# Patient Record
Sex: Male | Born: 1975 | Race: White | Hispanic: No | Smoking: Current every day smoker
Health system: Southern US, Community
[De-identification: ages and names within clinical notes are randomized; demographics above are authoritative.]

## PROBLEM LIST (undated history)

## (undated) DIAGNOSIS — Z9289 Personal history of other medical treatment: Secondary | ICD-10-CM

## (undated) DIAGNOSIS — Z8719 Personal history of other diseases of the digestive system: Secondary | ICD-10-CM

## (undated) DIAGNOSIS — F1910 Other psychoactive substance abuse, uncomplicated: Secondary | ICD-10-CM

## (undated) DIAGNOSIS — Z8711 Personal history of peptic ulcer disease: Secondary | ICD-10-CM

## (undated) DIAGNOSIS — M545 Low back pain, unspecified: Secondary | ICD-10-CM

## (undated) DIAGNOSIS — G8929 Other chronic pain: Secondary | ICD-10-CM

## (undated) DIAGNOSIS — B1920 Unspecified viral hepatitis C without hepatic coma: Secondary | ICD-10-CM

## (undated) DIAGNOSIS — F329 Major depressive disorder, single episode, unspecified: Secondary | ICD-10-CM

## (undated) DIAGNOSIS — R112 Nausea with vomiting, unspecified: Secondary | ICD-10-CM

## (undated) DIAGNOSIS — M25519 Pain in unspecified shoulder: Secondary | ICD-10-CM

## (undated) DIAGNOSIS — J42 Unspecified chronic bronchitis: Secondary | ICD-10-CM

## (undated) DIAGNOSIS — IMO0002 Reserved for concepts with insufficient information to code with codable children: Secondary | ICD-10-CM

## (undated) DIAGNOSIS — G629 Polyneuropathy, unspecified: Secondary | ICD-10-CM

## (undated) DIAGNOSIS — F419 Anxiety disorder, unspecified: Secondary | ICD-10-CM

## (undated) DIAGNOSIS — F32A Depression, unspecified: Secondary | ICD-10-CM

## (undated) DIAGNOSIS — F17200 Nicotine dependence, unspecified, uncomplicated: Secondary | ICD-10-CM

## (undated) DIAGNOSIS — R413 Other amnesia: Secondary | ICD-10-CM

## (undated) DIAGNOSIS — K219 Gastro-esophageal reflux disease without esophagitis: Secondary | ICD-10-CM

## (undated) DIAGNOSIS — F1990 Other psychoactive substance use, unspecified, uncomplicated: Secondary | ICD-10-CM

## (undated) DIAGNOSIS — K7290 Hepatic failure, unspecified without coma: Secondary | ICD-10-CM

## (undated) DIAGNOSIS — Z9889 Other specified postprocedural states: Secondary | ICD-10-CM

## (undated) HISTORY — PX: APPENDECTOMY: SHX54

## (undated) HISTORY — DX: Other chronic pain: G89.29

## (undated) HISTORY — PX: LUMBAR DISC SURGERY: SHX700

## (undated) HISTORY — PX: BACK SURGERY: SHX140

---
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
## 2013-02-03 DIAGNOSIS — Z9289 Personal history of other medical treatment: Secondary | ICD-10-CM

## 2013-02-03 HISTORY — DX: Personal history of other medical treatment: Z92.89

## 2013-02-26 ENCOUNTER — Emergency Department (HOSPITAL_COMMUNITY): Payer: Medicare Other | Admitting: Anesthesiology

## 2013-02-26 ENCOUNTER — Encounter (HOSPITAL_COMMUNITY): Payer: Self-pay | Admitting: Anesthesiology

## 2013-02-26 ENCOUNTER — Emergency Department (HOSPITAL_COMMUNITY): Payer: Medicare Other

## 2013-02-26 ENCOUNTER — Inpatient Hospital Stay (HOSPITAL_COMMUNITY)
Admission: EM | Admit: 2013-02-26 | Discharge: 2013-03-11 | DRG: 463 | Disposition: A | Discharge status: Skilled Nursing Facility | Payer: Medicare Other | Attending: Orthopedic Surgery | Admitting: Orthopedic Surgery

## 2013-02-26 ENCOUNTER — Encounter (HOSPITAL_COMMUNITY): Admission: EM | Disposition: A | Discharge status: Skilled Nursing Facility | Payer: Self-pay | Source: Home / Self Care

## 2013-02-26 DIAGNOSIS — S85509A Unspecified injury of popliteal vein, unspecified leg, initial encounter: Secondary | ICD-10-CM | POA: Diagnosis present

## 2013-02-26 DIAGNOSIS — D62 Acute posthemorrhagic anemia: Secondary | ICD-10-CM | POA: Diagnosis present

## 2013-02-26 DIAGNOSIS — S82209B Unspecified fracture of shaft of unspecified tibia, initial encounter for open fracture type I or II: Secondary | ICD-10-CM

## 2013-02-26 DIAGNOSIS — S82409B Unspecified fracture of shaft of unspecified fibula, initial encounter for open fracture type I or II: Principal | ICD-10-CM | POA: Diagnosis present

## 2013-02-26 DIAGNOSIS — M79609 Pain in unspecified limb: Secondary | ICD-10-CM | POA: Diagnosis present

## 2013-02-26 DIAGNOSIS — S85502A Unspecified injury of popliteal vein, left leg, initial encounter: Secondary | ICD-10-CM

## 2013-02-26 DIAGNOSIS — T794XXA Traumatic shock, initial encounter: Secondary | ICD-10-CM | POA: Diagnosis present

## 2013-02-26 DIAGNOSIS — R000 Tachycardia, unspecified: Secondary | ICD-10-CM | POA: Diagnosis present

## 2013-02-26 DIAGNOSIS — S8990XA Unspecified injury of unspecified lower leg, initial encounter: Secondary | ICD-10-CM | POA: Diagnosis present

## 2013-02-26 DIAGNOSIS — R578 Other shock: Secondary | ICD-10-CM

## 2013-02-26 DIAGNOSIS — S85009A Unspecified injury of popliteal artery, unspecified leg, initial encounter: Secondary | ICD-10-CM | POA: Diagnosis present

## 2013-02-26 DIAGNOSIS — M549 Dorsalgia, unspecified: Secondary | ICD-10-CM | POA: Diagnosis present

## 2013-02-26 DIAGNOSIS — I959 Hypotension, unspecified: Secondary | ICD-10-CM | POA: Diagnosis present

## 2013-02-26 DIAGNOSIS — S82201B Unspecified fracture of shaft of right tibia, initial encounter for open fracture type I or II: Secondary | ICD-10-CM

## 2013-02-26 DIAGNOSIS — M216X9 Other acquired deformities of unspecified foot: Secondary | ICD-10-CM | POA: Diagnosis present

## 2013-02-26 DIAGNOSIS — G5792 Unspecified mononeuropathy of left lower limb: Secondary | ICD-10-CM | POA: Diagnosis present

## 2013-02-26 DIAGNOSIS — K219 Gastro-esophageal reflux disease without esophagitis: Secondary | ICD-10-CM | POA: Diagnosis present

## 2013-02-26 DIAGNOSIS — F17200 Nicotine dependence, unspecified, uncomplicated: Secondary | ICD-10-CM | POA: Diagnosis present

## 2013-02-26 DIAGNOSIS — G8929 Other chronic pain: Secondary | ICD-10-CM | POA: Diagnosis present

## 2013-02-26 DIAGNOSIS — S85002A Unspecified injury of popliteal artery, left leg, initial encounter: Secondary | ICD-10-CM

## 2013-02-26 DIAGNOSIS — S82202C Unspecified fracture of shaft of left tibia, initial encounter for open fracture type IIIA, IIIB, or IIIC: Secondary | ICD-10-CM

## 2013-02-26 DIAGNOSIS — E876 Hypokalemia: Secondary | ICD-10-CM | POA: Diagnosis present

## 2013-02-26 DIAGNOSIS — G5790 Unspecified mononeuropathy of unspecified lower limb: Secondary | ICD-10-CM | POA: Diagnosis present

## 2013-02-26 HISTORY — PX: BYPASS GRAFT POPLITEAL TO TIBIAL: SHX5764

## 2013-02-26 HISTORY — DX: Pain in unspecified shoulder: M25.519

## 2013-02-26 HISTORY — PX: EXTERNAL FIXATION LEG: SHX1549

## 2013-02-26 HISTORY — DX: Other psychoactive substance use, unspecified, uncomplicated: F19.90

## 2013-02-26 HISTORY — PX: APPLICATION OF WOUND VAC: SHX5189

## 2013-02-26 HISTORY — DX: Gastro-esophageal reflux disease without esophagitis: K21.9

## 2013-02-26 HISTORY — DX: Other chronic pain: G89.29

## 2013-02-26 HISTORY — DX: Nicotine dependence, unspecified, uncomplicated: F17.200

## 2013-02-26 LAB — POCT I-STAT, CHEM 8
BUN: 16 mg/dL (ref 6–23)
Chloride: 108 mEq/L (ref 96–112)
Creatinine, Ser: 1.4 mg/dL — ABNORMAL HIGH (ref 0.50–1.35)
Hemoglobin: 16.7 g/dL (ref 13.0–17.0)
Potassium: 4.3 mEq/L (ref 3.5–5.1)
Sodium: 140 mEq/L (ref 135–145)

## 2013-02-26 LAB — SAMPLE TO BLOOD BANK

## 2013-02-26 LAB — COMPREHENSIVE METABOLIC PANEL
AST: 23 U/L (ref 0–37)
Albumin: 4 g/dL (ref 3.5–5.2)
BUN: 16 mg/dL (ref 6–23)
Chloride: 102 mEq/L (ref 96–112)
Creatinine, Ser: 1.42 mg/dL — ABNORMAL HIGH (ref 0.50–1.35)
Potassium: 4.2 mEq/L (ref 3.5–5.1)
Total Bilirubin: 0.4 mg/dL (ref 0.3–1.2)
Total Protein: 7.3 g/dL (ref 6.0–8.3)

## 2013-02-26 LAB — CBC
HCT: 46.5 % (ref 39.0–52.0)
MCH: 33.3 pg (ref 26.0–34.0)
MCV: 91.5 fL (ref 78.0–100.0)
Platelets: 297 10*3/uL (ref 150–400)
RBC: 5.08 MIL/uL (ref 4.22–5.81)
WBC: 22.2 10*3/uL — ABNORMAL HIGH (ref 4.0–10.5)

## 2013-02-26 SURGERY — EXTERNAL FIXATION, LOWER EXTREMITY
Anesthesia: General | Site: Leg Lower | Laterality: Right

## 2013-02-26 MED ORDER — ETOMIDATE 2 MG/ML IV SOLN
INTRAVENOUS | Status: DC | PRN
  Administered 2013-02-26: 20 mg via INTRAVENOUS

## 2013-02-26 MED ORDER — ONDANSETRON HCL 4 MG/2ML IJ SOLN
INTRAMUSCULAR | Status: DC | PRN
  Administered 2013-02-26: 4 mg via INTRAVENOUS

## 2013-02-26 MED ORDER — HALOPERIDOL LACTATE 5 MG/ML IJ SOLN
INTRAMUSCULAR | Status: AC
  Administered 2013-02-26: 2 mg
  Filled 2013-02-26: qty 1

## 2013-02-26 MED ORDER — FENTANYL CITRATE 0.05 MG/ML IJ SOLN
25.0000 ug | Freq: Once | INTRAMUSCULAR | Status: AC
  Administered 2013-02-26: 25 ug via INTRAVENOUS

## 2013-02-26 MED ORDER — SODIUM CHLORIDE 0.9 % IV SOLN
INTRAVENOUS | Status: DC | PRN
  Administered 2013-02-26: 21:00:00 via INTRAVENOUS

## 2013-02-26 MED ORDER — SODIUM BICARBONATE 8.4 % IV SOLN
INTRAVENOUS | Status: DC | PRN
  Administered 2013-02-26: 50 meq via INTRAVENOUS

## 2013-02-26 MED ORDER — FENTANYL CITRATE 0.05 MG/ML IJ SOLN
INTRAMUSCULAR | Status: AC
  Filled 2013-02-26: qty 2

## 2013-02-26 MED ORDER — CEFAZOLIN SODIUM-DEXTROSE 2-3 GM-% IV SOLR
INTRAVENOUS | Status: AC
  Administered 2013-02-26: 2000 mg
  Filled 2013-02-26: qty 50

## 2013-02-26 MED ORDER — 0.9 % SODIUM CHLORIDE (POUR BTL) OPTIME
TOPICAL | Status: DC | PRN
  Administered 2013-02-26: 1000 mL

## 2013-02-26 MED ORDER — LACTATED RINGERS IV SOLN
INTRAVENOUS | Status: DC | PRN
  Administered 2013-02-26: 21:00:00 via INTRAVENOUS

## 2013-02-26 MED ORDER — SODIUM CHLORIDE 0.9 % IV SOLN: 1000.0000 mL | Freq: Once | INTRAVENOUS | Status: DC

## 2013-02-26 MED ORDER — CEFAZOLIN SODIUM-DEXTROSE 2-3 GM-% IV SOLR
2.0000 g | Freq: Three times a day (TID) | INTRAVENOUS | Status: DC
  Filled 2013-02-26 (×2): qty 50

## 2013-02-26 MED ORDER — LIDOCAINE HCL (CARDIAC) 20 MG/ML IV SOLN
INTRAVENOUS | Status: DC | PRN
  Administered 2013-02-26: 100 mg via INTRAVENOUS

## 2013-02-26 MED ORDER — PROPOFOL 10 MG/ML IV BOLUS: INTRAVENOUS | Status: DC | PRN

## 2013-02-26 MED ORDER — SODIUM CHLORIDE 0.9 % IV SOLN
10.0000 mg | INTRAVENOUS | Status: DC | PRN
  Administered 2013-02-26: 25 ug/min via INTRAVENOUS

## 2013-02-26 MED ORDER — ARTIFICIAL TEARS OP OINT
TOPICAL_OINTMENT | OPHTHALMIC | Status: DC | PRN
  Administered 2013-02-26: 1 via OPHTHALMIC

## 2013-02-26 MED ORDER — ALBUMIN HUMAN 5 % IV SOLN
INTRAVENOUS | Status: DC | PRN
  Administered 2013-02-26 (×2): via INTRAVENOUS

## 2013-02-26 MED ORDER — TETANUS-DIPHTH-ACELL PERTUSSIS 5-2.5-18.5 LF-MCG/0.5 IM SUSP
INTRAMUSCULAR | Status: AC
  Filled 2013-02-26: qty 0.5

## 2013-02-26 MED ORDER — TETANUS-DIPHTH-ACELL PERTUSSIS 5-2.5-18.5 LF-MCG/0.5 IM SUSP
0.5000 mL | Freq: Once | INTRAMUSCULAR | Status: AC
  Administered 2013-02-26: 0.5 mL via INTRAMUSCULAR

## 2013-02-26 MED ORDER — VECURONIUM BROMIDE 10 MG IV SOLR
INTRAVENOUS | Status: DC | PRN
  Administered 2013-02-26: 10 mg via INTRAVENOUS
  Administered 2013-02-26: 5 mg via INTRAVENOUS
  Administered 2013-02-26: 10 mg via INTRAVENOUS
  Administered 2013-02-26: 5 mg via INTRAVENOUS

## 2013-02-26 MED ORDER — FENTANYL CITRATE 0.05 MG/ML IJ SOLN
INTRAMUSCULAR | Status: DC | PRN
  Administered 2013-02-26: 100 ug via INTRAVENOUS
  Administered 2013-02-26: 50 ug via INTRAVENOUS
  Administered 2013-02-26: 100 ug via INTRAVENOUS
  Administered 2013-02-26: 150 ug via INTRAVENOUS
  Administered 2013-02-26: 100 ug via INTRAVENOUS
  Administered 2013-02-27 (×2): 50 ug via INTRAVENOUS

## 2013-02-26 MED ORDER — SODIUM CHLORIDE 0.9 % IV SOLN
1000.0000 mL | Freq: Once | INTRAVENOUS | Status: AC
  Administered 2013-02-26: 1000 mL via INTRAVENOUS

## 2013-02-26 MED ORDER — DEXAMETHASONE SODIUM PHOSPHATE 4 MG/ML IJ SOLN
INTRAMUSCULAR | Status: DC | PRN
  Administered 2013-02-26: 8 mg via INTRAVENOUS

## 2013-02-26 MED ORDER — SODIUM CHLORIDE 0.9 % IR SOLN
Status: DC | PRN
  Administered 2013-02-26: 22:00:00

## 2013-02-26 MED ORDER — HEPARIN SODIUM (PORCINE) 1000 UNIT/ML IJ SOLN
INTRAMUSCULAR | Status: DC | PRN
  Administered 2013-02-26 – 2013-02-27 (×2): 5000 [IU] via INTRAVENOUS

## 2013-02-26 MED ORDER — SODIUM CHLORIDE 0.9 % IV SOLN: 1000.0000 mL | INTRAVENOUS | Status: DC

## 2013-02-26 MED ORDER — SUCCINYLCHOLINE CHLORIDE 20 MG/ML IJ SOLN
INTRAMUSCULAR | Status: DC | PRN
  Administered 2013-02-26: 120 mg via INTRAVENOUS

## 2013-02-26 SURGICAL SUPPLY — 112 items
BANDAGE CONFORM 3  STR LF (GAUZE/BANDAGES/DRESSINGS) ×8 IMPLANT
BANDAGE ELASTIC 4 VELCRO ST LF (GAUZE/BANDAGES/DRESSINGS) ×4 IMPLANT
BANDAGE ELASTIC 6 VELCRO ST LF (GAUZE/BANDAGES/DRESSINGS) ×4 IMPLANT
BANDAGE ESMARK 6X9 LF (GAUZE/BANDAGES/DRESSINGS) ×3 IMPLANT
BANDAGE GAUZE ELAST BULKY 4 IN (GAUZE/BANDAGES/DRESSINGS) ×8 IMPLANT
BNDG ESMARK 6X9 LF (GAUZE/BANDAGES/DRESSINGS) ×4
CANISTER SUCTION 2500CC (MISCELLANEOUS) ×4 IMPLANT
CANISTER WOUND CARE 500ML ATS (WOUND CARE) ×4 IMPLANT
CATH EMB 3FR 80CM (CATHETERS) ×4 IMPLANT
CLAMP LG COMBINATION (Clamp) ×4 IMPLANT
CLAMP PIN LRG 6 POSITION (Clamp) ×8 IMPLANT
CLAMP ROD ATTACHMENT (Clamp) ×4 IMPLANT
CLEANER TIP ELECTROSURG 2X2 (MISCELLANEOUS) ×4 IMPLANT
CLIP TI MEDIUM 24 (CLIP) ×8 IMPLANT
CLIP TI MEDIUM 6 (CLIP) ×4 IMPLANT
CLIP TI WIDE RED SMALL 24 (CLIP) ×8 IMPLANT
CLIP TI WIDE RED SMALL 6 (CLIP) ×4 IMPLANT
CLOTH BEACON ORANGE TIMEOUT ST (SAFETY) ×4 IMPLANT
COVER SURGICAL LIGHT HANDLE (MISCELLANEOUS) ×4 IMPLANT
COVER TABLE BACK 60X90 (DRAPES) ×4 IMPLANT
CUFF TOURNIQUET SINGLE 34IN LL (TOURNIQUET CUFF) ×8 IMPLANT
DERMABOND ADVANCED (GAUZE/BANDAGES/DRESSINGS) ×1
DERMABOND ADVANCED .7 DNX12 (GAUZE/BANDAGES/DRESSINGS) ×3 IMPLANT
DRAIN CHANNEL 15F RND FF W/TCR (WOUND CARE) IMPLANT
DRAPE C-ARM 42X72 X-RAY (DRAPES) ×4 IMPLANT
DRAPE INCISE IOBAN 66X45 STRL (DRAPES) ×4 IMPLANT
DRAPE OEC MINIVIEW 54X84 (DRAPES) ×4 IMPLANT
DRAPE WARM FLUID 44X44 (DRAPE) ×4 IMPLANT
DRAPE X-RAY CASS 24X20 (DRAPES) IMPLANT
DRSG ADAPTIC 3X8 NADH LF (GAUZE/BANDAGES/DRESSINGS) ×4 IMPLANT
DRSG COVADERM 4X10 (GAUZE/BANDAGES/DRESSINGS) ×4 IMPLANT
DRSG COVADERM 4X14 (GAUZE/BANDAGES/DRESSINGS) ×4 IMPLANT
DRSG COVADERM 4X8 (GAUZE/BANDAGES/DRESSINGS) ×4 IMPLANT
DRSG VAC ATS LRG SENSATRAC (GAUZE/BANDAGES/DRESSINGS) ×4 IMPLANT
ELECT REM PT RETURN 9FT ADLT (ELECTROSURGICAL)
ELECTRODE REM PT RTRN 9FT ADLT (ELECTROSURGICAL) IMPLANT
EVACUATOR SILICONE 100CC (DRAIN) IMPLANT
GAUZE XEROFORM 1X8 LF (GAUZE/BANDAGES/DRESSINGS) ×16 IMPLANT
GLOVE BIO SURGEON STRL SZ 6.5 (GLOVE) ×24 IMPLANT
GLOVE BIO SURGEON STRL SZ8 (GLOVE) ×8 IMPLANT
GLOVE BIOGEL M STRL SZ7.5 (GLOVE) ×4 IMPLANT
GLOVE BIOGEL PI IND STRL 6.5 (GLOVE) ×12 IMPLANT
GLOVE BIOGEL PI IND STRL 7.5 (GLOVE) ×6 IMPLANT
GLOVE BIOGEL PI IND STRL 8.5 (GLOVE) ×3 IMPLANT
GLOVE BIOGEL PI INDICATOR 6.5 (GLOVE) ×4
GLOVE BIOGEL PI INDICATOR 7.5 (GLOVE) ×2
GLOVE BIOGEL PI INDICATOR 8.5 (GLOVE) ×1
GLOVE ECLIPSE 6.0 STRL STRAW (GLOVE) ×16 IMPLANT
GLOVE EUDERMIC 7 POWDERFREE (GLOVE) ×4 IMPLANT
GLOVE SS BIOGEL STRL SZ 7.5 (GLOVE) ×6 IMPLANT
GLOVE SUPERSENSE BIOGEL SZ 7.5 (GLOVE) ×2
GLOVE SURG ORTHO 8.0 STRL STRW (GLOVE) ×4 IMPLANT
GLOVE SURG SS PI 7.5 STRL IVOR (GLOVE) ×16 IMPLANT
GOWN PREVENTION PLUS XXLARGE (GOWN DISPOSABLE) ×4 IMPLANT
GOWN STRL NON-REIN LRG LVL3 (GOWN DISPOSABLE) ×36 IMPLANT
GOWN STRL REIN XL XLG (GOWN DISPOSABLE) ×8 IMPLANT
HANDPIECE INTERPULSE COAX TIP (DISPOSABLE)
HEMOSTAT SNOW SURGICEL 2X4 (HEMOSTASIS) IMPLANT
KIT BASIN OR (CUSTOM PROCEDURE TRAY) IMPLANT
KIT ROOM TURNOVER OR (KITS) ×4 IMPLANT
LOOP VESSEL MAXI BLUE (MISCELLANEOUS) ×4 IMPLANT
LOOP VESSEL MINI RED (MISCELLANEOUS) ×4 IMPLANT
NEEDLE 22X1 1/2 (OR ONLY) (NEEDLE) ×4 IMPLANT
NS IRRIG 1000ML POUR BTL (IV SOLUTION) ×12 IMPLANT
PACK ORTHO EXTREMITY (CUSTOM PROCEDURE TRAY) ×4 IMPLANT
PACK PERIPHERAL VASCULAR (CUSTOM PROCEDURE TRAY) ×4 IMPLANT
PAD ARMBOARD 7.5X6 YLW CONV (MISCELLANEOUS) ×8 IMPLANT
PADDING CAST COTTON 6X4 STRL (CAST SUPPLIES) ×12 IMPLANT
ROD CARBON FIBER 450MM (Rod) ×8 IMPLANT
SCREW SCHNZ SD 5.0 60 THRD/150 (Screw) ×12 IMPLANT
SCRW SCHANZ SD 5.0 60 THRD/150 (Screw) ×16 IMPLANT
SET COLLECT BLD 21X3/4 12 (NEEDLE) ×4 IMPLANT
SET COLLECT BLD 21X3/4 12 PB (MISCELLANEOUS) ×4 IMPLANT
SET HNDPC FAN SPRY TIP SCT (DISPOSABLE) IMPLANT
SET MICROPUNCTURE 5F STIFF (MISCELLANEOUS) ×4 IMPLANT
SHEATH AVANTI 11CM 5FR (MISCELLANEOUS) ×8 IMPLANT
SPONGE GAUZE 4X4 12PLY (GAUZE/BANDAGES/DRESSINGS) ×4 IMPLANT
SPONGE SCRUB IODOPHOR (GAUZE/BANDAGES/DRESSINGS) ×4 IMPLANT
STAPLER VISISTAT (STAPLE) IMPLANT
STAPLER VISISTAT 35W (STAPLE) ×12 IMPLANT
STOCKINETTE IMPERVIOUS LG (DRAPES) ×4 IMPLANT
STOPCOCK 4 WAY LG BORE MALE ST (IV SETS) ×4 IMPLANT
STOPCOCK MORSE 400PSI 3WAY (MISCELLANEOUS) ×4 IMPLANT
STRIP CLOSURE SKIN 1/2X4 (GAUZE/BANDAGES/DRESSINGS) IMPLANT
SUCTION FRAZIER TIP 10 FR DISP (SUCTIONS) IMPLANT
SUT ETHILON 3 0 PS 1 (SUTURE) IMPLANT
SUT PROLENE 5 0 C 1 24 (SUTURE) ×8 IMPLANT
SUT PROLENE 6 0 BV (SUTURE) ×12 IMPLANT
SUT SILK 0 FSL (SUTURE) ×4 IMPLANT
SUT SILK 0 TIES 10X30 (SUTURE) ×8 IMPLANT
SUT SILK 2 0 FS (SUTURE) ×4 IMPLANT
SUT SILK 3 0 (SUTURE) ×3
SUT SILK 3-0 18XBRD TIE 12 (SUTURE) ×9 IMPLANT
SUT VIC AB 0 CT1 27 (SUTURE) ×2
SUT VIC AB 0 CT1 27XBRD ANBCTR (SUTURE) ×6 IMPLANT
SUT VIC AB 2-0 CT1 27 (SUTURE) ×3
SUT VIC AB 2-0 CT1 TAPERPNT 27 (SUTURE) ×9 IMPLANT
SUT VIC AB 2-0 CT3 27 (SUTURE) IMPLANT
SUT VIC AB 3-0 SH 27 (SUTURE) ×6
SUT VIC AB 3-0 SH 27X BRD (SUTURE) ×18 IMPLANT
SUT VICRYL 4-0 PS2 18IN ABS (SUTURE) IMPLANT
SYR 3ML LL SCALE MARK (SYRINGE) ×4 IMPLANT
SYR CONTROL 10ML LL (SYRINGE) ×4 IMPLANT
TOWEL OR 17X24 6PK STRL BLUE (TOWEL DISPOSABLE) ×12 IMPLANT
TOWEL OR 17X26 10 PK STRL BLUE (TOWEL DISPOSABLE) ×24 IMPLANT
TRAY FOLEY CATH 14FRSI W/METER (CATHETERS) IMPLANT
TUBE CONNECTING 12X1/4 (SUCTIONS) ×8 IMPLANT
TUBING EXTENTION W/L.L. (IV SETS) ×4 IMPLANT
UNDERPAD 30X30 INCONTINENT (UNDERPADS AND DIAPERS) ×20 IMPLANT
WATER STERILE IRR 1000ML POUR (IV SOLUTION) ×8 IMPLANT
WIRE BENTSON .035X145CM (WIRE) ×4 IMPLANT
YANKAUER SUCT BULB TIP NO VENT (SUCTIONS) ×4 IMPLANT
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
## 2013-02-26 NOTE — ED Notes (Signed)
Remaining units of O- blood and FFP transported with patient to OR.

## 2013-02-26 NOTE — OR Nursing (Signed)
Dr. Rennis Chris present at start of case. Intraop placement of part of Ex. Fix. at around 2230 per Dr. Rennis Chris with Dr. Myra Gianotti continuing his part and Dr. Rennis Chris to finish later.

## 2013-02-26 NOTE — ED Notes (Signed)
Blood bank notified for more O- blood and FFP.

## 2013-02-26 NOTE — Anesthesia Preprocedure Evaluation (Signed)
Anesthesia Evaluation  Patient identified by MRN, date of birth, ID band Patient awake    Reviewed: Unable to perform ROS - Chart review only  Airway Mallampati: II  Neck ROM: Limited  Mouth opening: Limited Mouth Opening Comment: Pt arrives c cervical collar. He is awake. Limited time for ROS Dental   Pulmonary  breath sounds clear to auscultation        Cardiovascular Rhythm:Regular Rate:Tachycardia     Neuro/Psych    GI/Hepatic   Endo/Other    Renal/GU      Musculoskeletal   Abdominal (+) + obese,   Peds  Hematology   Anesthesia Other Findings   Reproductive/Obstetrics                           Anesthesia Physical Anesthesia Plan  ASA: II and emergent  Anesthesia Plan: General   Post-op Pain Management:    Induction: Intravenous  Airway Management Planned: Oral ETT  Additional Equipment:   Intra-op Plan:   Post-operative Plan: Extubation in OR  Informed Consent: I have reviewed the patients History and Physical, chart, labs and discussed the procedure including the risks, benefits and alternatives for the proposed anesthesia with the patient or authorized representative who has indicated his/her understanding and acceptance.   Dental advisory given  Plan Discussed with:   Anesthesia Plan Comments:         Anesthesia Quick Evaluation

## 2013-02-26 NOTE — ED Notes (Signed)
Pt remains alert and conscious at this time.

## 2013-02-26 NOTE — ED Notes (Signed)
1st unit of blood complete via rapid infuser.

## 2013-02-26 NOTE — ED Notes (Signed)
2 Liters Normal Saline initiated.
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
## 2013-02-26 NOTE — ED Notes (Signed)
Second unit of O- Blood started via rapid infuser.

## 2013-02-26 NOTE — ED Notes (Signed)
Patient being prepared for rapid infusion of O- blood

## 2013-02-26 NOTE — ED Notes (Signed)
Second unit of O- Blood complete.

## 2013-02-26 NOTE — ED Notes (Addendum)
O- Negative blood started. No signs or symptoms of reaction.

## 2013-02-26 NOTE — Anesthesia Procedure Notes (Signed)
Procedure Name: Intubation Date/Time: 02/26/2013 8:43 PM Performed by: Wray Kearns A Pre-anesthesia Checklist: Patient identified, Timeout performed, Emergency Drugs available, Suction available and Patient being monitored Patient Re-evaluated:Patient Re-evaluated prior to inductionOxygen Delivery Method: Circle system utilized Preoxygenation: Pre-oxygenation with 100% oxygen Intubation Type: IV induction, Rapid sequence and Cricoid Pressure applied Laryngoscope Size: Mac and 4 Grade View: Grade I Tube type: Oral Tube size: 8.0 mm Number of attempts: 1 Airway Equipment and Method: Stylet Placement Confirmation: ETT inserted through vocal cords under direct vision,  breath sounds checked- equal and bilateral and positive ETCO2 Secured at: 23 cm Tube secured with: Tape Dental Injury: Teeth and Oropharynx as per pre-operative assessment  Comments: Cervical collar disconnected during induction , re-applied after Intubation per Dr Jacklynn Bue .VSS. BBSE checked .

## 2013-02-26 NOTE — ED Notes (Signed)
Pt currently being transported to OR
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
## 2013-02-26 NOTE — ED Notes (Signed)
Generalized abrasion throughout body. Pt c/o of significant pain to R shoulder and R Tibia.

## 2013-02-27 ENCOUNTER — Inpatient Hospital Stay (HOSPITAL_COMMUNITY): Payer: Medicare Other

## 2013-02-27 ENCOUNTER — Emergency Department (HOSPITAL_COMMUNITY): Payer: Medicare Other

## 2013-02-27 ENCOUNTER — Encounter (HOSPITAL_COMMUNITY): Payer: Self-pay | Admitting: *Deleted

## 2013-02-27 LAB — PREPARE FRESH FROZEN PLASMA
Unit division: 0
Unit division: 0

## 2013-02-27 LAB — MRSA PCR SCREENING: MRSA by PCR: NEGATIVE

## 2013-02-27 LAB — BASIC METABOLIC PANEL
BUN: 9 mg/dL (ref 6–23)
Chloride: 110 mEq/L (ref 96–112)
GFR calc Af Amer: >90 mL/min (ref >90)
Potassium: 3.9 mEq/L (ref 3.5–5.1)
Sodium: 142 mEq/L (ref 135–145)

## 2013-02-27 LAB — CBC
HCT: 31.4 % — ABNORMAL LOW (ref 39.0–52.0)
RDW: 16.6 % — ABNORMAL HIGH (ref 11.5–15.5)
WBC: 13.8 10*3/uL — ABNORMAL HIGH (ref 4.0–10.5)

## 2013-02-27 MED ORDER — HYDROMORPHONE HCL PF 1 MG/ML IJ SOLN
0.2500 mg | INTRAMUSCULAR | Status: DC | PRN
  Administered 2013-02-27 (×2): 0.5 mg via INTRAVENOUS

## 2013-02-27 MED ORDER — IOHEXOL 300 MG/ML  SOLN: INTRAMUSCULAR | Status: DC | PRN

## 2013-02-27 MED ORDER — CALCIUM CHLORIDE 10 % IV SOLN: INTRAVENOUS | Status: DC | PRN

## 2013-02-27 MED ORDER — VANCOMYCIN HCL IN DEXTROSE 1-5 GM/200ML-% IV SOLN
1000.0000 mg | Freq: Three times a day (TID) | INTRAVENOUS | Status: DC
  Administered 2013-02-27 – 2013-02-28 (×3): 1000 mg via INTRAVENOUS
  Filled 2013-02-27 (×5): qty 200

## 2013-02-27 MED ORDER — DEXTROSE 5 % IV SOLN
INTRAVENOUS | Status: DC | PRN
  Administered 2013-02-27: 01:00:00 via INTRAVENOUS

## 2013-02-27 MED ORDER — HYDROMORPHONE HCL PF 1 MG/ML IJ SOLN
INTRAMUSCULAR | Status: AC
  Filled 2013-02-27: qty 1

## 2013-02-27 MED ORDER — MEPERIDINE HCL 25 MG/ML IJ SOLN: 6.2500 mg | INTRAMUSCULAR | Status: DC | PRN

## 2013-02-27 MED ORDER — DIPHENHYDRAMINE HCL 12.5 MG/5ML PO ELIX
12.5000 mg | ORAL_SOLUTION | Freq: Four times a day (QID) | ORAL | Status: DC | PRN
  Administered 2013-02-27: 12.5 mg via ORAL
  Filled 2013-02-27 (×2): qty 5

## 2013-02-27 MED ORDER — DIPHENHYDRAMINE HCL 50 MG/ML IJ SOLN
12.5000 mg | Freq: Four times a day (QID) | INTRAMUSCULAR | Status: DC | PRN
  Administered 2013-02-27 – 2013-03-01 (×2): 12.5 mg via INTRAVENOUS
  Filled 2013-02-27 (×2): qty 1

## 2013-02-27 MED ORDER — MIDAZOLAM HCL 5 MG/5ML IJ SOLN
INTRAMUSCULAR | Status: DC | PRN
  Administered 2013-02-26: 2 mg via INTRAVENOUS

## 2013-02-27 MED ORDER — NALOXONE HCL 0.4 MG/ML IJ SOLN: 0.4000 mg | INTRAMUSCULAR | Status: DC | PRN

## 2013-02-27 MED ORDER — LEVOFLOXACIN IN D5W 500 MG/100ML IV SOLN
500.0000 mg | Freq: Once | INTRAVENOUS | Status: AC
  Administered 2013-02-27: 500 mg via INTRAVENOUS
  Filled 2013-02-27: qty 100

## 2013-02-27 MED ORDER — SODIUM CHLORIDE 0.9 % IV SOLN
INTRAVENOUS | Status: DC
  Administered 2013-02-27: 100 mL via INTRAVENOUS

## 2013-02-27 MED ORDER — NEOSTIGMINE METHYLSULFATE 1 MG/ML IJ SOLN
INTRAMUSCULAR | Status: DC | PRN
  Administered 2013-02-27: 5 mg via INTRAVENOUS

## 2013-02-27 MED ORDER — PANTOPRAZOLE SODIUM 40 MG IV SOLR
40.0000 mg | Freq: Every day | INTRAVENOUS | Status: DC
  Administered 2013-02-27 – 2013-03-01 (×3): 40 mg via INTRAVENOUS
  Filled 2013-02-27 (×3): qty 40

## 2013-02-27 MED ORDER — CEFAZOLIN SODIUM-DEXTROSE 2-3 GM-% IV SOLR
INTRAVENOUS | Status: DC | PRN
  Administered 2013-02-27: 2 g via INTRAVENOUS

## 2013-02-27 MED ORDER — OXYCODONE HCL 5 MG PO TABS: 5.0000 mg | ORAL_TABLET | Freq: Once | ORAL | Status: DC | PRN

## 2013-02-27 MED ORDER — ONDANSETRON HCL 4 MG/2ML IJ SOLN: 4.0000 mg | Freq: Four times a day (QID) | INTRAMUSCULAR | Status: DC | PRN

## 2013-02-27 MED ORDER — ONDANSETRON HCL 4 MG PO TABS: 4.0000 mg | ORAL_TABLET | Freq: Four times a day (QID) | ORAL | Status: DC | PRN

## 2013-02-27 MED ORDER — HYDROMORPHONE 0.3 MG/ML IV SOLN
INTRAVENOUS | Status: AC
  Filled 2013-02-27: qty 25

## 2013-02-27 MED ORDER — IOHEXOL 300 MG/ML  SOLN
80.0000 mL | Freq: Once | INTRAMUSCULAR | Status: AC | PRN
  Administered 2013-02-27: 80 mL via INTRAVENOUS

## 2013-02-27 MED ORDER — PROMETHAZINE HCL 25 MG/ML IJ SOLN: 6.2500 mg | INTRAMUSCULAR | Status: DC | PRN

## 2013-02-27 MED ORDER — HYDROMORPHONE 0.3 MG/ML IV SOLN
INTRAVENOUS | Status: DC
  Administered 2013-02-27: 4.08 mg via INTRAVENOUS
  Administered 2013-02-27: 2.5 mg via INTRAVENOUS
  Administered 2013-02-27: 1.8 mg via INTRAVENOUS
  Administered 2013-02-27 (×2): via INTRAVENOUS
  Administered 2013-02-27: 2.4 mg via INTRAVENOUS
  Administered 2013-02-28: 4.2 mg via INTRAVENOUS
  Administered 2013-02-28: 1.8 mg via INTRAVENOUS
  Administered 2013-02-28: 0.9 mg via INTRAVENOUS
  Administered 2013-02-28: 1.5 mg via INTRAVENOUS
  Administered 2013-02-28: 2.7 mg via INTRAVENOUS
  Administered 2013-02-28: 12:00:00 via INTRAVENOUS
  Administered 2013-03-01: 0.3 mg via INTRAVENOUS
  Administered 2013-03-01: 1.5 mg via INTRAVENOUS
  Administered 2013-03-01: 11:00:00 via INTRAVENOUS
  Administered 2013-03-01: 1.35 mg via INTRAVENOUS
  Administered 2013-03-01: 0.3 mg via INTRAVENOUS
  Filled 2013-02-27 (×6): qty 25

## 2013-02-27 MED ORDER — PROTAMINE SULFATE 10 MG/ML IV SOLN
INTRAVENOUS | Status: DC | PRN
  Administered 2013-02-27: 50 mg via INTRAVENOUS

## 2013-02-27 MED ORDER — OXYCODONE HCL 5 MG/5ML PO SOLN: 5.0000 mg | Freq: Once | ORAL | Status: DC | PRN

## 2013-02-27 MED ORDER — GLYCOPYRROLATE 0.2 MG/ML IJ SOLN
INTRAMUSCULAR | Status: DC | PRN
  Administered 2013-02-27: 0.6 mg via INTRAVENOUS

## 2013-02-27 MED ORDER — SODIUM CHLORIDE 0.9 % IJ SOLN: 9.0000 mL | INTRAMUSCULAR | Status: DC | PRN

## 2013-02-27 MED ORDER — CEFAZOLIN SODIUM 1-5 GM-% IV SOLN
INTRAVENOUS | Status: AC
  Filled 2013-02-27: qty 100

## 2013-02-27 MED ORDER — PANTOPRAZOLE SODIUM 40 MG PO TBEC: 40.0000 mg | DELAYED_RELEASE_TABLET | Freq: Every day | ORAL | Status: DC

## 2013-02-27 NOTE — Progress Notes (Signed)
Spoke with radiology about patient's CT scans. Was told that there are several "STAT" orders ahead of him, and that they will call me when they can get him in; and that it will be closer to 7am.  A. Josiyah Tozzi RN
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
## 2013-02-27 NOTE — Progress Notes (Signed)
I agree with the above The patient is doing remarkably well this morning. He does not complain of significant pain in his foot. He has no motor or sensory function in the left leg which is not 2 different from his baseline. He has a palpable posterior tibial pulse.  I will defer to orthopedics for the appropriate antibiotic coverage for the open tibia fibula fracture.  Wound VAC remains in place with expected drainage. We will monitor his hemoglobin. He may require transfusion. He will need to return trip to the operating room within the next one to 2 days.  Durene Cal

## 2013-02-27 NOTE — Progress Notes (Signed)
D; Sent CBC & BMP to lab
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
## 2013-02-27 NOTE — Anesthesia Postprocedure Evaluation (Signed)
  Anesthesia Post-op Note  Patient: [name]  Procedure(s) Performed: Procedure(s) with comments: EXTERNAL FIXATION LEG (Left) BYPASS GRAFT POPLITEAL TO TIBIAL (Left) - using Right Reversed Greater Saphenous Vein VEIN HARVEST (Right) - Harvest of Right Greater Saphenous Vein  APPLICATION OF WOUND VAC (Left)  Patient Location: PACU  Anesthesia Type:General  Level of Consciousness: awake and sedated  Airway and Oxygen Therapy: Patient Spontanous Breathing  Post-op Pain: mild  Post-op Assessment: Post-op Vital signs reviewed  Post-op Vital Signs: stable  Complications: No apparent anesthesia complications
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
## 2013-02-27 NOTE — Op Note (Signed)
02/26/2013 - 02/27/2013  6:54 AM  PATIENT:   [name]  37 y.o. male  PRE-OPERATIVE DIAGNOSIS:  Tibia/fibula fracture Left III-C    POST-OPERATIVE DIAGNOSIS:  same  PROCEDURE: I and D left III-C tib-fib fracture, Spanning external fixation distal femur to distal tibia  SURGEON:  Yaslin Kirtley, Vania Rea M.D.  ASSISTANTS: Dixon, pac  ANESTHESIA:   GET  EBL: Available from anesthesia record  SPECIMEN:  none  Drains: VAC placed by Dr. Myra Gianotti   PATIENT DISPOSITION:  Remained in OR at completion of orthopaedic procedures    PLAN OF CARE: monitor vascular and wound status.  Dictation# [id number]
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
## 2013-02-27 NOTE — Progress Notes (Signed)
Subjective: 1 Day Post-Op Procedure(s) (LRB): EXTERNAL FIXATION LEG (Left) BYPASS GRAFT POPLITEAL TO TIBIAL (Left) VEIN HARVEST (Right) APPLICATION OF WOUND VAC (Left) Patient reports pain as moderate.  Has no feeling in foot. Says he had nerve damage from previous back problem but still had some sensation prior to this injury   Objective: Vital signs in last 24 hours: Temp:  [97.9 F (36.6 C)-99.5 F (37.5 C)] 98.7 F (37.1 C) (05/25 0808) Pulse Rate:  [82-168] 88 (05/25 0800) Resp:  [11-39] 14 (05/25 0811) BP: (60-160)/(33-95) 106/64 mmHg (05/25 0800) SpO2:  [96 %-100 %] 100 % (05/25 0811) Arterial Line BP: (121-172)/(60-73) 125/68 mmHg (05/25 0800) Weight:  [190 lb (86.183 kg)] 190 lb (86.183 kg) (05/24 1921)  Intake/Output from previous day: 05/24 0701 - 05/25 0700 In: 16109 [I.V.:10350; UEAVW:0981; IV Piggyback:750] Out: 9000 [Urine:7250; Drains:350; Blood:1400] Intake/Output this shift: Total I/O In: 100 [IV Piggyback:100] Out: -    Recent Labs  02/26/13 1907 02/26/13 1926 02/27/13 0330  HGB 16.9 16.7 11.0*    Recent Labs  02/26/13 1907 02/26/13 1926 02/27/13 0330  WBC 22.2*  --  13.8*  RBC 5.08  --  3.59*  HCT 46.5 49.0 31.4*  PLT 297  --  122*    Recent Labs  02/26/13 1907 02/26/13 1926 02/27/13 0330  NA 138 140 142  K 4.2 4.3 3.9  CL 102 108 110  CO2 23  --  24  BUN 16 16 9   CREATININE 1.42* 1.40* 0.81  GLUCOSE 163* 158* 184*  CALCIUM 9.4  --  7.3*    Recent Labs  02/26/13 1907  INR 0.93    No cellulitis present Compartment soft Foot warm and pink. No sensation (plantar or dorsal) and no motor left foot  Assessment/Plan: 1 Day Post-Op Procedure(s) (LRB): EXTERNAL FIXATION LEG (Left) BYPASS GRAFT POPLITEAL TO TIBIAL (Left) VEIN HARVEST (Right) APPLICATION OF WOUND VAC (Left)  Continued neurovascular checks LLE. Continue VAC.  Loanne Drilling 02/27/2013, 9:04 AM

## 2013-02-27 NOTE — Transfer of Care (Signed)
Immediate Anesthesia Transfer of Care Note  Patient: [name]  Procedure(s) Performed: Procedure(s) with comments: EXTERNAL FIXATION LEG (Left) BYPASS GRAFT POPLITEAL TO TIBIAL (Left) - using Right Reversed Greater Saphenous Vein VEIN HARVEST (Right) - Harvest of Right Greater Saphenous Vein  APPLICATION OF WOUND VAC (Left)  Patient Location: PACU  Anesthesia Type:General  Level of Consciousness: sedated, patient cooperative and responds to stimulation  Airway & Oxygen Therapy: Patient Spontanous Breathing and Patient connected to nasal cannula oxygen  Post-op Assessment: Report given to PACU RN, Post -op Vital signs reviewed and stable and **Pt at the moment unable to move left foot/ toes*  Post vital signs: Reviewed and stable  Complications: No apparent anesthesia complications
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
## 2013-02-28 DIAGNOSIS — R000 Tachycardia, unspecified: Secondary | ICD-10-CM

## 2013-02-28 DIAGNOSIS — D62 Acute posthemorrhagic anemia: Secondary | ICD-10-CM

## 2013-02-28 LAB — VANCOMYCIN, TROUGH: Vancomycin Tr: 8.7 ug/mL — ABNORMAL LOW (ref 10.0–20.0)

## 2013-02-28 LAB — CDS SEROLOGY

## 2013-02-28 MED ORDER — DILTIAZEM HCL 100 MG IV SOLR: 10.0000 mg/h | INTRAVENOUS | Status: DC

## 2013-02-28 MED ORDER — VANCOMYCIN HCL 10 G IV SOLR
1250.0000 mg | Freq: Three times a day (TID) | INTRAVENOUS | Status: DC
  Administered 2013-02-28 – 2013-03-05 (×13): 1250 mg via INTRAVENOUS
  Filled 2013-02-28 (×18): qty 1250

## 2013-02-28 MED ORDER — ENOXAPARIN SODIUM 30 MG/0.3ML ~~LOC~~ SOLN
30.0000 mg | Freq: Two times a day (BID) | SUBCUTANEOUS | Status: DC
  Administered 2013-02-28 – 2013-03-02 (×5): 30 mg via SUBCUTANEOUS
  Filled 2013-02-28 (×9): qty 0.3

## 2013-02-28 MED ORDER — CARISOPRODOL 350 MG PO TABS
350.0000 mg | ORAL_TABLET | Freq: Three times a day (TID) | ORAL | Status: DC
  Administered 2013-02-28 – 2013-03-08 (×23): 350 mg via ORAL
  Filled 2013-02-28 (×23): qty 1

## 2013-02-28 NOTE — Progress Notes (Signed)
[facility] Progress Note  02/28/2013 7:21 AM 2 Days Post-Op  Subjective:  "my foot's hurting" RN states pt extremely tachycardic with bath and turning last pm  Afebrile HR 80's-120 regular 100-130 systolic 100% 1LO2NC  Filed Vitals:   02/28/13 0700  BP: 112/56  Pulse: 108  Temp:   Resp: 18    Physical Exam: Incisions:  Wound vac in place with good seal; all other incisions with bandages and are c/d/i Extremities:  Left foot is warm with palpable PT  CBC    Component Value Date/Time   WBC 13.8* 02/27/2013 0330   RBC 3.59* 02/27/2013 0330   HGB 11.0* 02/27/2013 0330   HCT 31.4* 02/27/2013 0330   PLT 122* 02/27/2013 0330   MCV 87.5 02/27/2013 0330   MCH 30.6 02/27/2013 0330   MCHC 35.0 02/27/2013 0330   RDW 16.6* 02/27/2013 0330    BMET    Component Value Date/Time   NA 142 02/27/2013 0330   K 3.9 02/27/2013 0330   CL 110 02/27/2013 0330   CO2 24 02/27/2013 0330   GLUCOSE 184* 02/27/2013 0330   BUN 9 02/27/2013 0330   CREATININE 0.81 02/27/2013 0330   CALCIUM 7.3* 02/27/2013 0330   GFRNONAA >90 02/27/2013 0330   GFRAA >90 02/27/2013 0330    INR    Component Value Date/Time   INR 0.93 02/26/2013 1907     Intake/Output Summary (Last 24 hours) at 02/28/13 0721 Last data filed at 02/28/13 0600  Gross per 24 hour  Intake 3941.93 ml  Output   4150 ml  Net -208.07 ml     Assessment/Plan:  37 y.o. male is s/p:  #1: Left distal superficial femoral artery to posterior tibial artery bypass graft with contralateral reversed greater saphenous vein. #2: 4 compartment fasciotomy   2 Days Post-Op  -pt will need return to OR in next day or two for washout and wound vac change. -DVT prophylaxis: ok to start lovenox per Dr. Myra Gianotti -continue to monitor progress-bypass graft is patent -continue ABx per ortho/trauma-on levaquin and vancomycin   Doreatha Massed, PA-C [facility] [phone] 02/28/2013 7:21 AM
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
## 2013-02-28 NOTE — Progress Notes (Signed)
Subjective: 2 Days Post-Op Procedure(s) (LRB): EXTERNAL FIXATION LEG (Left) BYPASS GRAFT POPLITEAL TO TIBIAL (Left) VEIN HARVEST (Right) APPLICATION OF WOUND VAC (Left) Patient reports pain as severe.    Objective: Vital signs in last 24 hours: Temp:  [98 F (36.7 C)-98.5 F (36.9 C)] 98.5 F (36.9 C) (05/26 0756) Pulse Rate:  [93-119] 116 (05/26 0800) Resp:  [11-23] 20 (05/26 0800) BP: (98-129)/(49-87) 112/62 mmHg (05/26 0800) SpO2:  [95 %-100 %] 99 % (05/26 0800) Arterial Line BP: (86-128)/(65-110) 86/65 mmHg (05/26 0800)  Intake/Output from previous day: 05/25 0701 - 05/26 0700 In: 4041.9 [P.O.:1244; I.V.:2097.9; IV Piggyback:700] Out: 4150 [Urine:3650; Drains:500] Intake/Output this shift: Total I/O In: 100 [I.V.:100] Out: 500 [Urine:450; Drains:50]   Recent Labs  02/26/13 1907 02/26/13 1926 02/27/13 0330  HGB 16.9 16.7 11.0*    Recent Labs  02/26/13 1907 02/26/13 1926 02/27/13 0330  WBC 22.2*  --  13.8*  RBC 5.08  --  3.59*  HCT 46.5 49.0 31.4*  PLT 297  --  122*    Recent Labs  02/26/13 1907 02/26/13 1926 02/27/13 0330  NA 138 140 142  K 4.2 4.3 3.9  CL 102 108 110  CO2 23  --  24  BUN 16 16 9   CREATININE 1.42* 1.40* 0.81  GLUCOSE 163* 158* 184*  CALCIUM 9.4  --  7.3*    Recent Labs  02/26/13 1907  INR 0.93    wn wd male in nad.  A and O.  EOMI.  Resp unlabored.  R shoulder with full ROM and full strength thfroughout RC.  Sens to LT intact in radial, median and ulnar n dist.  2+ radial and ulnar pulses.  TTP at scap posteriorly.  R LE without tenderness, swelling or deformity.  L UE without tenderness, swelling or deformity.  L LE with wound vac in place over medial wound.  Ex fix in place spanning the knee.  L foot with 1+ dp and pt pulses.  No sens to LT in deep peroneal n dist.  No sens in lateral plantar n dist.  Diminished sens to LT in medial plantar n dist and sup peronal n dist.  0/5 strength at toe flexors, GS, tib ant, EHL, long  toe extensors.   Ankle is plantar flexed.  Assessment/Plan: 2 Days Post-Op Procedure(s) (LRB): EXTERNAL FIXATION LEG (Left) BYPASS GRAFT POPLITEAL TO TIBIAL (Left) VEIN HARVEST (Right) APPLICATION OF WOUND VAC (Left)  I'll start SOMA to help with muscle cramps.  Pt will need an AFO to help with ankle positioning.  Per vascular, the plan is for repeat I and D and wound vac change in the next few days.  He'll need definitive treatment of the proximal tibia fracture as well.  Timing of this is uncertain.  Continue NWB on the L LE.  [name] 02/28/2013, 8:54 AM

## 2013-02-28 NOTE — Progress Notes (Signed)
I agree with the above. Wound VAC remains in place. Drainage has decreased. He continues to have a palpable posterior tibial pulse. He'll need a wound VAC change. Ultrasound according this with orthopedics. I would try to start Lovenox for DVT prophylaxis today and monitor his wound VAC output  [name]

## 2013-02-28 NOTE — Progress Notes (Signed)
Agree with above 

## 2013-03-01 ENCOUNTER — Inpatient Hospital Stay (HOSPITAL_COMMUNITY): Payer: Medicare Other

## 2013-03-01 ENCOUNTER — Inpatient Hospital Stay (HOSPITAL_COMMUNITY): Payer: Medicare Other | Admitting: Anesthesiology

## 2013-03-01 ENCOUNTER — Encounter (HOSPITAL_COMMUNITY): Admission: EM | Disposition: A | Discharge status: Skilled Nursing Facility | Payer: Self-pay | Source: Home / Self Care

## 2013-03-01 ENCOUNTER — Encounter (HOSPITAL_COMMUNITY): Payer: Self-pay | Admitting: Anesthesiology

## 2013-03-01 DIAGNOSIS — M549 Dorsalgia, unspecified: Secondary | ICD-10-CM | POA: Diagnosis present

## 2013-03-01 DIAGNOSIS — K219 Gastro-esophageal reflux disease without esophagitis: Secondary | ICD-10-CM | POA: Diagnosis present

## 2013-03-01 DIAGNOSIS — E876 Hypokalemia: Secondary | ICD-10-CM

## 2013-03-01 DIAGNOSIS — G5792 Unspecified mononeuropathy of left lower limb: Secondary | ICD-10-CM | POA: Diagnosis present

## 2013-03-01 HISTORY — PX: APPLICATION OF WOUND VAC: SHX5189

## 2013-03-01 HISTORY — PX: I & D EXTREMITY: SHX5045

## 2013-03-01 LAB — POCT I-STAT 7, (LYTES, BLD GAS, ICA,H+H)
Acid-base deficit: 4 mmol/L — ABNORMAL HIGH (ref 0.0–2.0)
Bicarbonate: 20 meq/L (ref 20.0–24.0)
Bicarbonate: 24.2 meq/L — ABNORMAL HIGH (ref 20.0–24.0)
Calcium, Ion: 1.01 mmol/L — ABNORMAL LOW (ref 1.12–1.23)
HCT: 38 % — ABNORMAL LOW (ref 39.0–52.0)
Hemoglobin: 12.9 g/dL — ABNORMAL LOW (ref 13.0–17.0)
Hemoglobin: 9.2 g/dL — ABNORMAL LOW (ref 13.0–17.0)
O2 Saturation: 100 %
O2 Saturation: 100 %
Patient temperature: 36.4
Potassium: 3.9 meq/L (ref 3.5–5.1)
Potassium: 3.7 meq/L (ref 3.5–5.1)
Sodium: 143 meq/L (ref 135–145)
Sodium: 146 meq/L — ABNORMAL HIGH (ref 135–145)
TCO2: 24 mmol/L (ref 0–100)
TCO2: 25 mmol/L (ref 0–100)
pCO2 arterial: 41.2 mmHg (ref 35.0–45.0)
pH, Arterial: 7.288 — ABNORMAL LOW (ref 7.350–7.450)
pH, Arterial: 7.326 — ABNORMAL LOW (ref 7.350–7.450)
pH, Arterial: 7.375 (ref 7.350–7.450)
pO2, Arterial: 439 mmHg — ABNORMAL HIGH (ref 80.0–100.0)

## 2013-03-01 LAB — BASIC METABOLIC PANEL
Chloride: 99 mEq/L (ref 96–112)
Creatinine, Ser: 0.72 mg/dL (ref 0.50–1.35)
GFR calc Af Amer: >90 mL/min (ref >90)
GFR calc non Af Amer: >90 mL/min (ref >90)

## 2013-03-01 LAB — CBC
MCV: 87.4 fL (ref 78.0–100.0)
Platelets: 135 10*3/uL — ABNORMAL LOW (ref 150–400)
RDW: 15.3 % (ref 11.5–15.5)
WBC: 12.4 10*3/uL — ABNORMAL HIGH (ref 4.0–10.5)

## 2013-03-01 SURGERY — APPLICATION, WOUND VAC
Anesthesia: General | Site: Leg Lower | Laterality: Left | Wound class: Contaminated

## 2013-03-01 MED ORDER — PREGABALIN 50 MG PO CAPS
300.0000 mg | ORAL_CAPSULE | Freq: Two times a day (BID) | ORAL | Status: DC
  Administered 2013-03-01 – 2013-03-11 (×18): 300 mg via ORAL
  Filled 2013-03-01 (×18): qty 6

## 2013-03-01 MED ORDER — PANTOPRAZOLE SODIUM 40 MG PO TBEC
40.0000 mg | DELAYED_RELEASE_TABLET | Freq: Two times a day (BID) | ORAL | Status: DC
  Administered 2013-03-01 – 2013-03-11 (×18): 40 mg via ORAL
  Filled 2013-03-01 (×18): qty 1

## 2013-03-01 MED ORDER — ONDANSETRON HCL 4 MG/2ML IJ SOLN
INTRAMUSCULAR | Status: DC | PRN
  Administered 2013-03-01: 4 mg via INTRAVENOUS

## 2013-03-01 MED ORDER — LACTATED RINGERS IV SOLN
INTRAVENOUS | Status: DC | PRN
  Administered 2013-03-01: 11:00:00 via INTRAVENOUS

## 2013-03-01 MED ORDER — FENTANYL CITRATE 0.05 MG/ML IJ SOLN
INTRAMUSCULAR | Status: DC | PRN
  Administered 2013-03-01 (×2): 50 ug via INTRAVENOUS
  Administered 2013-03-01: 100 ug via INTRAVENOUS
  Administered 2013-03-01: 50 ug via INTRAVENOUS

## 2013-03-01 MED ORDER — OXYCODONE HCL 5 MG/5ML PO SOLN: 5.0000 mg | Freq: Once | ORAL | Status: DC | PRN

## 2013-03-01 MED ORDER — MIDAZOLAM HCL 5 MG/5ML IJ SOLN
INTRAMUSCULAR | Status: DC | PRN
  Administered 2013-03-01: 2 mg via INTRAVENOUS

## 2013-03-01 MED ORDER — PREGABALIN 50 MG PO CAPS: 300.0000 mg | ORAL_CAPSULE | Freq: Two times a day (BID) | ORAL | Status: DC

## 2013-03-01 MED ORDER — POTASSIUM CHLORIDE CRYS ER 20 MEQ PO TBCR
40.0000 meq | EXTENDED_RELEASE_TABLET | Freq: Two times a day (BID) | ORAL | Status: DC
  Administered 2013-03-01 – 2013-03-11 (×19): 40 meq via ORAL
  Filled 2013-03-01 (×25): qty 2

## 2013-03-01 MED ORDER — DOXEPIN HCL 75 MG PO CAPS: 150.0000 mg | ORAL_CAPSULE | Freq: Every day | ORAL | Status: DC

## 2013-03-01 MED ORDER — PROPOFOL 10 MG/ML IV BOLUS
INTRAVENOUS | Status: DC | PRN
  Administered 2013-03-01: 200 mg via INTRAVENOUS

## 2013-03-01 MED ORDER — 0.9 % SODIUM CHLORIDE (POUR BTL) OPTIME
TOPICAL | Status: DC | PRN
  Administered 2013-03-01: 1000 mL

## 2013-03-01 MED ORDER — HYDROMORPHONE HCL PF 1 MG/ML IJ SOLN
1.0000 mg | INTRAMUSCULAR | Status: DC | PRN
  Administered 2013-03-01 – 2013-03-08 (×31): 1 mg via INTRAVENOUS
  Filled 2013-03-01 (×31): qty 1

## 2013-03-01 MED ORDER — DOXEPIN HCL 75 MG PO CAPS
150.0000 mg | ORAL_CAPSULE | Freq: Every day | ORAL | Status: DC
  Administered 2013-03-01 – 2013-03-10 (×10): 150 mg via ORAL
  Filled 2013-03-01 (×11): qty 2

## 2013-03-01 MED ORDER — PHENYLEPHRINE HCL 10 MG/ML IJ SOLN
INTRAMUSCULAR | Status: DC | PRN
  Administered 2013-03-01: 80 ug via INTRAVENOUS

## 2013-03-01 MED ORDER — OXYCODONE HCL 5 MG PO TABS: 5.0000 mg | ORAL_TABLET | Freq: Once | ORAL | Status: DC | PRN

## 2013-03-01 MED ORDER — HYDROMORPHONE HCL PF 1 MG/ML IJ SOLN: 0.2500 mg | INTRAMUSCULAR | Status: DC | PRN

## 2013-03-01 MED ORDER — LIDOCAINE HCL (CARDIAC) 20 MG/ML IV SOLN
INTRAVENOUS | Status: DC | PRN
  Administered 2013-03-01: 100 mg via INTRAVENOUS

## 2013-03-01 MED ORDER — OXYCODONE HCL 5 MG PO TABS
10.0000 mg | ORAL_TABLET | ORAL | Status: DC | PRN
  Administered 2013-03-01 (×2): 10 mg via ORAL
  Administered 2013-03-01 – 2013-03-10 (×42): 20 mg via ORAL
  Filled 2013-03-01 (×12): qty 4
  Filled 2013-03-01: qty 2
  Filled 2013-03-01 (×6): qty 4
  Filled 2013-03-01: qty 1
  Filled 2013-03-01 (×9): qty 4
  Filled 2013-03-01: qty 2
  Filled 2013-03-01 (×15): qty 4

## 2013-03-01 SURGICAL SUPPLY — 46 items
BANDAGE CONFORM 3  STR LF (GAUZE/BANDAGES/DRESSINGS) ×4 IMPLANT
BANDAGE ELASTIC 4 VELCRO ST LF (GAUZE/BANDAGES/DRESSINGS) IMPLANT
BANDAGE ELASTIC 6 VELCRO ST LF (GAUZE/BANDAGES/DRESSINGS) IMPLANT
BANDAGE GAUZE ELAST BULKY 4 IN (GAUZE/BANDAGES/DRESSINGS) IMPLANT
CANISTER SUCTION 2500CC (MISCELLANEOUS) ×2 IMPLANT
CANISTER WOUND CARE 500ML ATS (WOUND CARE) ×2 IMPLANT
CLIP TI MEDIUM 6 (CLIP) IMPLANT
CLIP TI WIDE RED SMALL 6 (CLIP) IMPLANT
CLOTH BEACON ORANGE TIMEOUT ST (SAFETY) ×2 IMPLANT
COVER SURGICAL LIGHT HANDLE (MISCELLANEOUS) ×2 IMPLANT
DRAPE EXTREMITY BILATERAL (DRAPE) IMPLANT
DRAPE EXTREMITY T 121X128X90 (DRAPE) IMPLANT
DRAPE INCISE IOBAN 66X45 STRL (DRAPES) ×2 IMPLANT
DRAPE PROXIMA HALF (DRAPES) ×4 IMPLANT
DRAPE U-SHAPE 76X120 STRL (DRAPES) IMPLANT
DRSG COVADERM 4X6 (GAUZE/BANDAGES/DRESSINGS) ×2 IMPLANT
DRSG EMULSION OIL 3X3 NADH (GAUZE/BANDAGES/DRESSINGS) ×4 IMPLANT
DRSG VAC ATS LRG SENSATRAC (GAUZE/BANDAGES/DRESSINGS) ×2 IMPLANT
ELECT REM PT RETURN 9FT ADLT (ELECTROSURGICAL) ×2
ELECTRODE REM PT RTRN 9FT ADLT (ELECTROSURGICAL) ×1 IMPLANT
GAUZE XEROFORM 1X8 LF (GAUZE/BANDAGES/DRESSINGS) ×2 IMPLANT
GAUZE XEROFORM 5X9 LF (GAUZE/BANDAGES/DRESSINGS) IMPLANT
GLOVE BIOGEL PI IND STRL 7.5 (GLOVE) ×1 IMPLANT
GLOVE BIOGEL PI INDICATOR 7.5 (GLOVE) ×1
GLOVE SURG SS PI 7.5 STRL IVOR (GLOVE) ×4 IMPLANT
GOWN PREVENTION PLUS XXLARGE (GOWN DISPOSABLE) ×2 IMPLANT
GOWN STRL NON-REIN LRG LVL3 (GOWN DISPOSABLE) ×2 IMPLANT
KIT BASIN OR (CUSTOM PROCEDURE TRAY) ×2 IMPLANT
KIT ROOM TURNOVER OR (KITS) ×2 IMPLANT
NS IRRIG 1000ML POUR BTL (IV SOLUTION) ×2 IMPLANT
PACK CV ACCESS (CUSTOM PROCEDURE TRAY) IMPLANT
PACK GENERAL/GYN (CUSTOM PROCEDURE TRAY) ×2 IMPLANT
PACK UNIVERSAL I (CUSTOM PROCEDURE TRAY) ×2 IMPLANT
PAD ARMBOARD 7.5X6 YLW CONV (MISCELLANEOUS) ×4 IMPLANT
SPONGE GAUZE 4X4 12PLY (GAUZE/BANDAGES/DRESSINGS) ×2 IMPLANT
STAPLER VISISTAT 35W (STAPLE) IMPLANT
SUT ETHILON 3 0 PS 1 (SUTURE) IMPLANT
SUT SILK 3 0 (SUTURE) ×1
SUT SILK 3-0 18XBRD TIE 12 (SUTURE) ×1 IMPLANT
SUT VIC AB 2-0 CTX 36 (SUTURE) IMPLANT
SUT VIC AB 3-0 SH 27 (SUTURE)
SUT VIC AB 3-0 SH 27X BRD (SUTURE) IMPLANT
SUT VICRYL 4-0 PS2 18IN ABS (SUTURE) IMPLANT
TOWEL OR 17X24 6PK STRL BLUE (TOWEL DISPOSABLE) ×2 IMPLANT
TOWEL OR 17X26 10 PK STRL BLUE (TOWEL DISPOSABLE) ×2 IMPLANT
WATER STERILE IRR 1000ML POUR (IV SOLUTION) ×2 IMPLANT

## 2013-03-01 NOTE — Interval H&P Note (Signed)
History and Physical Interval Note:  03/01/2013 11:20 AM  [name]  has presented today for surgery, with the diagnosis of left leg wound vac  The various methods of treatment have been discussed with the patient and family. After consideration of risks, benefits and other options for treatment, the patient has consented to  Procedure(s):  WOUND VAC CHANGE (Left) IRRIGATION AND DEBRIDEMENT EXTREMITY (Left) as a surgical intervention .  The patient's history has been reviewed, patient examined, no change in status, stable for surgery.  I have reviewed the patient's chart and labs.  Questions were answered to the patient's satisfaction.     Izak Anding IV, V. WELLS

## 2013-03-01 NOTE — Transfer of Care (Signed)
Immediate Anesthesia Transfer of Care Note  Patient: [name]  Procedure(s) Performed: Procedure(s):  WOUND VAC CHANGE (Left) IRRIGATION AND DEBRIDEMENT EXTREMITY (Left)  Patient Location: PACU  Anesthesia Type:General  Level of Consciousness: awake, alert  and oriented  Airway & Oxygen Therapy: Patient Spontanous Breathing  Post-op Assessment: Report given to PACU RN and Post -op Vital signs reviewed and stable  Post vital signs: Reviewed and stable  Complications: No apparent anesthesia complications

## 2013-03-01 NOTE — Anesthesia Preprocedure Evaluation (Addendum)
Anesthesia Evaluation  Patient identified by MRN, date of birth, ID band Patient awake    Reviewed: Allergy & Precautions, H&P , NPO status , Patient's Chart, lab work & pertinent test results, reviewed documented beta blocker date and time   History of Anesthesia Complications Negative for: history of anesthetic complications  Airway Mallampati: I TM Distance: >3 FB Neck ROM: Full    Dental   Pulmonary COPDCurrent Smoker,  + rhonchi         Cardiovascular + Peripheral Vascular Disease Rhythm:Regular Rate:Normal     Neuro/Psych negative neurological ROS  negative psych ROS   GI/Hepatic Neg liver ROS, GERD-  Medicated and Controlled,  Endo/Other  negative endocrine ROS  Renal/GU negative Renal ROS  negative genitourinary   Musculoskeletal negative musculoskeletal ROS (+)   Abdominal   Peds negative pediatric ROS (+)  Hematology negative hematology ROS (+)   Anesthesia Other Findings   Reproductive/Obstetrics negative OB ROS                          Anesthesia Physical Anesthesia Plan  ASA: III  Anesthesia Plan: General   Post-op Pain Management:    Induction: Intravenous  Airway Management Planned: LMA  Additional Equipment:   Intra-op Plan:   Post-operative Plan: Extubation in OR  Informed Consent: I have reviewed the patients History and Physical, chart, labs and discussed the procedure including the risks, benefits and alternatives for the proposed anesthesia with the patient or authorized representative who has indicated his/her understanding and acceptance.     Plan Discussed with: CRNA and Surgeon  Anesthesia Plan Comments:         Anesthesia Quick Evaluation

## 2013-03-01 NOTE — Progress Notes (Signed)
UR completed 

## 2013-03-01 NOTE — Progress Notes (Signed)
Patient ID: [name], male   DOB: 12/01/1975, 37 y.o.   MRN: [id number]   LOS: 3 days   Subjective: C/o LLE pain that is only marginally treated with his PCA.   Objective: Vital signs in last 24 hours: Temp:  [97.6 F (36.4 C)-99.5 F (37.5 C)] 97.6 F (36.4 C) (05/27 1245) Pulse Rate:  [104-129] 104 (05/27 1300) Resp:  [15-25] 18 (05/27 1300) BP: (93-133)/(41-78) 129/75 mmHg (05/27 1245) SpO2:  [98 %-100 %] 99 % (05/27 1300) Arterial Line BP: (124-163)/(63-79) 155/73 mmHg (05/27 1300) Weight:  [198 lb 10.2 oz (90.1 kg)] 198 lb 10.2 oz (90.1 kg) (05/27 0500) Last BM Date: 02/25/13   Laboratory  CBC  Recent Labs  02/27/13 0330 03/01/13 0400  WBC 13.8* 12.4*  HGB 11.0* 9.1*  HCT 31.4* 25.6*  PLT 122* 135*   BMET  Recent Labs  02/27/13 0330 03/01/13 0400  NA 142 135  K 3.9 3.2*  CL 110 99  CO2 24 25  GLUCOSE 184* 106*  BUN 9 6  CREATININE 0.81 0.72  CALCIUM 7.3* 8.0*     Physical Exam General appearance: alert and no distress Resp: clear to auscultation bilaterally Cardio: Moderately tachycardic GI: normal findings: bowel sounds normal and soft, non-tender Extremities: NVI Pulses: 2+ and symmetric   Assessment/Plan: [facility] Left pop art/vein injuries s/p fem-pop bypass, fasciotomies -- per VVS Open left tib/fib fxs s/p ex fix -- Definitive care at undetermined time in future per Dr. Victorino Dike, PT/OT ABL anemia -- Moderate, follow FEN -- K+ for mild hypokalemia, orals for pain, d/c foley VTE -- Lovenox, right SCD Dispo -- To floor   Freeman Caldron, PA-C Pager: [phone] General Trauma PA Pager: [phone]   03/01/2013

## 2013-03-01 NOTE — Anesthesia Postprocedure Evaluation (Signed)
  Anesthesia Post-op Note  Patient: [name]  Procedure(s) Performed: Procedure(s):  WOUND VAC CHANGE (Left) IRRIGATION AND DEBRIDEMENT EXTREMITY (Left)  Patient Location: PACU  Anesthesia Type:General  Level of Consciousness: awake  Airway and Oxygen Therapy: Patient Spontanous Breathing  Post-op Pain: mild  Post-op Assessment: Post-op Vital signs reviewed, Patient's Cardiovascular Status Stable, Respiratory Function Stable, Patent Airway, No signs of Nausea or vomiting and Pain level controlled  Post-op Vital Signs: stable  Complications: No apparent anesthesia complications

## 2013-03-01 NOTE — Progress Notes (Addendum)
[facility] Progress Note  03/01/2013 7:22 AM 3 Days Post-Op  Subjective:  No complaints  Tm 99.5 now 98 HR 100's-130's regular 100-130's systolic 99% RA  Filed Vitals:   03/01/13 0700  BP: 125/68  Pulse: 111  Temp:   Resp: 24    Physical Exam: Incisions:  Wound vac in place with good seal; bandages in place and c/d/i at above knee incision left leg and vein harvest sites right leg. Extremties:  Good doppler signal left PT; pt able to move left great toe still unable to feel touch on plantar surface of foot.  He can feel on the dorsum of the foot just distal to ankle.  CBC    Component Value Date/Time   WBC 12.4* 03/01/2013 0400   RBC 2.93* 03/01/2013 0400   HGB 9.1* 03/01/2013 0400   HCT 25.6* 03/01/2013 0400   PLT 135* 03/01/2013 0400   MCV 87.4 03/01/2013 0400   MCH 31.1 03/01/2013 0400   MCHC 35.5 03/01/2013 0400   RDW 15.3 03/01/2013 0400    BMET    Component Value Date/Time   NA 135 03/01/2013 0400   K 3.2* 03/01/2013 0400   CL 99 03/01/2013 0400   CO2 25 03/01/2013 0400   GLUCOSE 106* 03/01/2013 0400   BUN 6 03/01/2013 0400   CREATININE 0.72 03/01/2013 0400   CALCIUM 8.0* 03/01/2013 0400   GFRNONAA >90 03/01/2013 0400   GFRAA >90 03/01/2013 0400    INR    Component Value Date/Time   INR 0.93 02/26/2013 1907     Intake/Output Summary (Last 24 hours) at 03/01/13 1610 Last data filed at 03/01/13 0700  Gross per 24 hour  Intake   3613 ml  Output   7050 ml  Net  -3437 ml   Would Vac output last 24 hours:  350cc/24hr  Assessment/Plan:  38 y.o. male is s/p:  #1: Left distal superficial femoral artery to posterior tibial artery bypass graft with contralateral reversed greater saphenous vein. #2: 4 compartment fasciotomy   3 Days Post-Op  -pt is able to move his left great toe this am, which is more than yesterday. -pt to OR this am for washout of left leg wound and vac change -DVT prophylaxis:  lovenox-will hold this am for OR   Doreatha Massed, PA-C [facility] [phone] 03/01/2013 7:22 AM    Agree with above, for I&D later today  Durene Cal
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
## 2013-03-01 NOTE — OR Nursing (Signed)
LATE ENTRY: Procedures one and two performed concurrently.  Procedure start 2109.

## 2013-03-01 NOTE — Progress Notes (Signed)
Agree with the above.  VAC change in OR otday  Wells Whitten Andreoni

## 2013-03-01 NOTE — Preoperative (Signed)
Beta Blockers   Reason not to administer Beta Blockers:Not Applicable 

## 2013-03-02 ENCOUNTER — Encounter (HOSPITAL_COMMUNITY): Payer: Self-pay | Admitting: Orthopedic Surgery

## 2013-03-02 LAB — TYPE AND SCREEN
ABO/RH(D): O POS
Antibody Screen: NEGATIVE
Unit division: 0
Unit division: 0
Unit division: 0
Unit division: 0
Unit division: 0

## 2013-03-02 LAB — VANCOMYCIN, TROUGH: Vancomycin Tr: 16.3 ug/mL (ref 10.0–20.0)

## 2013-03-02 LAB — BASIC METABOLIC PANEL
CO2: 24 mEq/L (ref 19–32)
Calcium: 8.3 mg/dL — ABNORMAL LOW (ref 8.4–10.5)
Chloride: 100 mEq/L (ref 96–112)
Glucose, Bld: 117 mg/dL — ABNORMAL HIGH (ref 70–99)
Potassium: 4.7 mEq/L (ref 3.5–5.1)
Sodium: 135 mEq/L (ref 135–145)

## 2013-03-02 LAB — CBC
Hemoglobin: 9.2 g/dL — ABNORMAL LOW (ref 13.0–17.0)
MCH: 30.4 pg (ref 26.0–34.0)
RBC: 3.03 MIL/uL — ABNORMAL LOW (ref 4.22–5.81)
WBC: 11.9 10*3/uL — ABNORMAL HIGH (ref 4.0–10.5)

## 2013-03-02 MED ORDER — ACETAMINOPHEN 325 MG PO TABS
650.0000 mg | ORAL_TABLET | Freq: Four times a day (QID) | ORAL | Status: DC | PRN
  Administered 2013-03-02 – 2013-03-10 (×3): 650 mg via ORAL
  Filled 2013-03-02 (×4): qty 2

## 2013-03-02 MED ORDER — DIPHENHYDRAMINE HCL 25 MG PO CAPS
25.0000 mg | ORAL_CAPSULE | Freq: Four times a day (QID) | ORAL | Status: DC | PRN
  Administered 2013-03-02 – 2013-03-03 (×2): 50 mg via ORAL
  Administered 2013-03-03: 25 mg via ORAL
  Administered 2013-03-04 – 2013-03-10 (×11): 50 mg via ORAL
  Filled 2013-03-02: qty 1
  Filled 2013-03-02 (×13): qty 2
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
## 2013-03-02 NOTE — Progress Notes (Signed)
[facility] [place]  Progress Note Bypass Surgery  Date of Surgery: 02/26/2013  #1: Left distal superficial femoral artery to posterior tibial artery bypass graft with contralateral reversed greater saphenous vein. #2: 4 compartment fasciotomy  History of Present Illness  [name] is a 37 y.o. male who is S/P  Left distal superficial femoral artery to posterior tibial artery bypass graft with contralateral reversed greater saphenous vein. He has no complaints.  Significant Diagnostic Studies: CBC Lab Results  Component Value Date   WBC 11.9* 03/02/2013   HGB 9.2* 03/02/2013   HCT 26.9* 03/02/2013   MCV 88.8 03/02/2013   PLT 146* 03/02/2013    BMET     Component Value Date/Time   NA 135 03/02/2013 0525   K 4.7 03/02/2013 0525   CL 100 03/02/2013 0525   CO2 24 03/02/2013 0525   GLUCOSE 117* 03/02/2013 0525   BUN 7 03/02/2013 0525   CREATININE 0.92 03/02/2013 0525   CALCIUM 8.3* 03/02/2013 0525   GFRNONAA >90 03/02/2013 0525   GFRAA >90 03/02/2013 0525    COAG Lab Results  Component Value Date   INR 0.93 02/26/2013   No results found for this basename: PTT    Physical Examination  BP Readings from Last 3 Encounters:  03/02/13 125/77  03/02/13 125/77  03/02/13 125/77   Temp Readings from Last 3 Encounters:  03/02/13 97.7 F (36.5 C)   03/02/13 97.7 F (36.5 C)   03/02/13 97.7 F (36.5 C)    SpO2 Readings from Last 3 Encounters:  03/02/13 100%  03/02/13 100%  03/02/13 100%   Pulse Readings from Last 3 Encounters:  03/02/13 104  03/02/13 104  03/02/13 104    Pt is A&O x 3 left lower extremity: Incision/s is/are clean,dry.intact, and  Healing with wound vac in place without hematoma, erythema or drainage Limb is warm; with good color Good sensation in left foot No motion noted in left foot Left Posterior tibial pulse is  palpable  Assessment/Plan: Pt. Doing well decreased motor function left foot - unchanged; some motion great toe  yesterday - none seen today Sensation intact to left foot with palpable PT pulse Wounds are healing well Continue wound care as ordered - wound vac Ex fix per Ortho  Jacquelina Hewins J  03/02/2013 8:10 AM
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
## 2013-03-02 NOTE — Progress Notes (Signed)
Trauma Service Note  Subjective: The patient is having some pain in the left leg.  To go to OR tomorrow.  Objective: Vital signs in last 24 hours: Temp:  [97.6 F (36.4 C)-98.9 F (37.2 C)] 97.7 F (36.5 C) (05/28 0611) Pulse Rate:  [102-133] 104 (05/28 0611) Resp:  [18-23] 18 (05/28 0611) BP: (102-135)/(50-77) 125/77 mmHg (05/28 0611) SpO2:  [99 %-100 %] 100 % (05/28 1610) Arterial Line BP: (132-155)/(66-76) 148/73 mmHg (05/27 1600) Last BM Date: 02/26/13  Intake/Output from previous day: 05/27 0701 - 05/28 0700 In: 1899.5 [P.O.:240; I.V.:1159.5; IV Piggyback:500] Out: 3125 [Urine:2750; Drains:350; Blood:25] Intake/Output this shift:    General: Moderate acute distress.  Lungs: Clear to auscultation  Abd: Soft, benign  Extremities: VAC on left leg.  No dorsi-flexion of left foot.  Good perfusion and pulses.  Neuro: Intact except for left perineal nerve function.  Lab Results: CBC   Recent Labs  03/01/13 0400 03/02/13 0525  WBC 12.4* 11.9*  HGB 9.1* 9.2*  HCT 25.6* 26.9*  PLT 135* 146*   BMET  Recent Labs  03/01/13 0400 03/02/13 0525  NA 135 135  K 3.2* 4.7  CL 99 100  CO2 25 24  GLUCOSE 106* 117*  BUN 6 7  CREATININE 0.72 0.92  CALCIUM 8.0* 8.3*   PT/INR No results found for this basename: LABPROT, INR,  in the last 72 hours ABG No results found for this basename: PHART, PCO2, PO2, HCO3,  in the last 72 hours  Studies/Results: Dg Knee 1-2 Views Left  03/01/2013   *RADIOLOGY REPORT*  Clinical Data: Tibial plateau fracture  LEFT KNEE - 1-2 VIEW  Comparison: Plain films 02/26/2013  Findings: External fixator present within the midshaft femur. There is a comminuted displaced fracture of the tibial metaphysis extending to the articular surface.  Fracture of the fibula additionally.  IMPRESSION:   Complex tibial plateau fracture with external fixator.   Original Report Authenticated By: Genevive Bi, M.D.   Ct Knee Left Wo Contrast  03/02/2013    *RADIOLOGY REPORT*  Clinical Data: Left tibial plateau fracture status post external fixation.  CT OF THE LEFT KNEE WITHOUT CONTRAST  Technique:  Multidetector CT imaging was performed according to the standard protocol. Multiplanar CT image reconstructions were also generated.  Comparison: Left knee radiographs 03/01/2013.  Findings: The patient is in external fixation with external fixators within the distal femoral diaphysis and mid tibial diaphysis.  The comminuted intra-articular fracture of the proximal tibia demonstrates improved alignment.  This fracture demonstrates intra-articular extension to involve both plateaus, primarily situated within the coronal plane.  These components are nondisplaced. There is extension into the proximal tibiofibular joint.  The tibial spine is undermined by the fracture. The metaphyseal components of the fracture remain mildly displaced primarily in the medial direction.  There is a transverse fracture through the proximal fibular diaphysis which is displaced medially by one shaft with and demonstrates 2.2 cm of overriding of the fracture fragments.  The fibular head appears intact.  The distal femur and patella are intact.  Soft tissue windows demonstrate a lipohemarthrosis at the knee with intra-articular air. High density within the soft tissues between the proximal tibia and fibula likely represents hematoma. Popliteal vascular injury cannot be excluded by this examination. The anterior cruciate ligament is poorly visualized and may be torn.  IMPRESSION:  1.  Improved alignment of the comminuted intra-articular fracture of the distal tibia.  The intra-articular components are nondisplaced. 2.  Undermining of the tibial spine with  suspected disruption of the anterior cruciate ligament. 3.  Mild displacement of proximal fibular diaphyseal fracture. 4.  Lipohemarthrosis with hematoma in the proximal lower leg. Popliteal vascular injury cannot be excluded.   Original Report  Authenticated By: Carey Bullocks, M.D.    Anti-infectives: Anti-infectives   Start     Dose/Rate Route Frequency Ordered Stop   02/28/13 1800  vancomycin (VANCOCIN) 1,250 mg in sodium chloride 0.9 % 250 mL IVPB     1,250 mg 166.7 mL/hr over 90 Minutes Intravenous Every 8 hours 02/28/13 1253     02/27/13 1200  vancomycin (VANCOCIN) IVPB 1000 mg/200 mL premix  Status:  Discontinued     1,000 mg 200 mL/hr over 60 Minutes Intravenous Every 8 hours 02/27/13 1148 02/28/13 1253   02/27/13 0800  levofloxacin (LEVAQUIN) IVPB 500 mg    Comments:  Received ancef at 0030 in OR   500 mg 100 mL/hr over 60 Minutes Intravenous  Once 02/27/13 0531 02/27/13 0905   02/26/13 2200  ceFAZolin (ANCEF) IVPB 2 g/50 mL premix  Status:  Discontinued     2 g 100 mL/hr over 30 Minutes Intravenous 3 times per day 02/26/13 1917 02/27/13 0531   02/26/13 1910  ceFAZolin (ANCEF) 2-3 GM-% IVPB SOLR    Comments:  MUNNETT, KRISTINA: cabinet override      02/26/13 1910 02/26/13 1918      Assessment/Plan: s/p Procedure(s):  WOUND VAC CHANGE IRRIGATION AND DEBRIDEMENT EXTREMITY No changes OR tomorrow per ortho. No general trauma issues.  LOS: 4 days   Marta Lamas. Gae Bon, MD, FACS [phone] Trauma Surgeon 03/02/2013

## 2013-03-02 NOTE — Progress Notes (Signed)
Occupational Therapy Treatment Patient Details Name: [name] MRN: [id number] DOB: [birth year] Today's Date: 03/02/2013 Time: 1027-2536 OT Time Calculation (min): 25 min  OT Assessment / Plan / Recommendation Comments on Treatment Session foot strap applied this session    Follow Up Recommendations  No OT follow up    Barriers to Discharge  None    Equipment Recommendations  3 in 1 bedside comode       Frequency Min 2X/week   Plan Discharge plan remains appropriate    Precautions / Restrictions Precautions Precautions: Fall Precaution Comments: external fixator LLE Restrictions Weight Bearing Restrictions: Yes LLE Weight Bearing: Non weight bearing          OT Diagnosis: Acute pain  OT Problem List: Decreased activity tolerance;Impaired balance (sitting and/or standing);Pain;Decreased knowledge of precautions;Decreased knowledge of use of DME or AE OT Treatment Interventions: Self-care/ADL training;Balance training;DME and/or AE instruction;Patient/family education;Therapeutic activities   OT Goals Acute Rehab OT Goals OT Goal Formulation: With patient Potential to Achieve Goals: Good ADL Goals Pt Will Perform Lower Body Dressing: with set-up;Unsupported;with adaptive equipment;Sit to stand from chair;Sit to stand from bed ADL Goal: Lower Body Dressing - Progress: Goal set today Pt Will Transfer to Toilet: with supervision;Stand pivot transfer;Ambulation;with DME;3-in-1;Maintaining weight bearing status ADL Goal: Toilet Transfer - Progress: Goal set today Pt Will Perform Toileting - Clothing Manipulation: with modified independence;Standing ADL Goal: Toileting - Clothing Manipulation - Progress: Goal set today Pt Will Perform Toileting - Hygiene: with modified independence;Sit to stand from 3-in-1/toilet ADL Goal: Toileting - Hygiene - Progress: Goal set today Miscellaneous OT Goals Miscellaneous OT Goal #1: Pt will be Mod I in and OOB for BADLs. OT Goal:  Miscellaneous Goal #1 - Progress: Goal set today Miscellaneous OT Goal #2: Pt will tolerate and understand the use of the foot strap on LLE. OT Goal: Miscellaneous Goal #2 - Progress: Goal set today  Visit Information  Last OT Received On: 03/02/13 Assistance Needed: +2           Cognition  Cognition Arousal/Alertness: Lethargic Behavior During Therapy: [facility] for tasks assessed/performed Overall Cognitive Status: Within Functional Limits for tasks assessed             End of Session OT - End of Session Patient left: in chair;with call bell/phone within reach Nurse Communication: Check foot strap each shift--Beth (RN)       Evette Georges [phone] 03/02/2013, 12:32 PM
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
## 2013-03-03 ENCOUNTER — Inpatient Hospital Stay (HOSPITAL_COMMUNITY): Payer: Medicare Other | Admitting: Anesthesiology

## 2013-03-03 ENCOUNTER — Encounter (HOSPITAL_COMMUNITY): Payer: Self-pay | Admitting: Anesthesiology

## 2013-03-03 ENCOUNTER — Encounter (HOSPITAL_COMMUNITY): Admission: EM | Disposition: A | Discharge status: Skilled Nursing Facility | Payer: Self-pay | Source: Home / Self Care

## 2013-03-03 HISTORY — PX: I & D EXTREMITY: SHX5045

## 2013-03-03 HISTORY — PX: APPLICATION OF WOUND VAC: SHX5189

## 2013-03-03 SURGERY — IRRIGATION AND DEBRIDEMENT EXTREMITY
Anesthesia: General | Site: Leg Lower | Laterality: Left | Wound class: Contaminated

## 2013-03-03 MED ORDER — LIDOCAINE HCL (CARDIAC) 20 MG/ML IV SOLN
INTRAVENOUS | Status: DC | PRN
  Administered 2013-03-03: 70 mg via INTRAVENOUS

## 2013-03-03 MED ORDER — SODIUM CHLORIDE 0.9 % IR SOLN
Status: DC | PRN
  Administered 2013-03-03: 3000 mL

## 2013-03-03 MED ORDER — HYDROMORPHONE HCL PF 1 MG/ML IJ SOLN
0.2500 mg | INTRAMUSCULAR | Status: DC | PRN
  Administered 2013-03-03: 0.5 mg via INTRAVENOUS

## 2013-03-03 MED ORDER — FENTANYL CITRATE 0.05 MG/ML IJ SOLN
INTRAMUSCULAR | Status: DC | PRN
  Administered 2013-03-03 (×5): 50 ug via INTRAVENOUS

## 2013-03-03 MED ORDER — LACTATED RINGERS IV SOLN
INTRAVENOUS | Status: DC
  Administered 2013-03-03 – 2013-03-06 (×3): via INTRAVENOUS

## 2013-03-03 MED ORDER — HYDROMORPHONE HCL PF 1 MG/ML IJ SOLN
INTRAMUSCULAR | Status: AC
  Administered 2013-03-03: 0.5 mg via INTRAVENOUS
  Filled 2013-03-03: qty 1

## 2013-03-03 MED ORDER — MIDAZOLAM HCL 5 MG/5ML IJ SOLN
INTRAMUSCULAR | Status: DC | PRN
  Administered 2013-03-03: 2 mg via INTRAVENOUS

## 2013-03-03 MED ORDER — ONDANSETRON HCL 4 MG/2ML IJ SOLN
INTRAMUSCULAR | Status: DC | PRN
  Administered 2013-03-03: 4 mg via INTRAVENOUS

## 2013-03-03 MED ORDER — ENOXAPARIN SODIUM 30 MG/0.3ML ~~LOC~~ SOLN
30.0000 mg | Freq: Two times a day (BID) | SUBCUTANEOUS | Status: DC
  Administered 2013-03-03 – 2013-03-06 (×7): 30 mg via SUBCUTANEOUS
  Filled 2013-03-03 (×9): qty 0.3

## 2013-03-03 MED ORDER — LACTATED RINGERS IV SOLN
INTRAVENOUS | Status: DC | PRN
  Administered 2013-03-03 (×2): via INTRAVENOUS

## 2013-03-03 MED ORDER — PROPOFOL 10 MG/ML IV BOLUS
INTRAVENOUS | Status: DC | PRN
  Administered 2013-03-03: 200 mg via INTRAVENOUS

## 2013-03-03 MED ORDER — TRAMADOL HCL 50 MG PO TABS
100.0000 mg | ORAL_TABLET | Freq: Four times a day (QID) | ORAL | Status: DC
  Administered 2013-03-03 – 2013-03-10 (×23): 100 mg via ORAL
  Filled 2013-03-03 (×26): qty 2

## 2013-03-03 MED ORDER — OXYCODONE HCL 5 MG/5ML PO SOLN: 5.0000 mg | Freq: Once | ORAL | Status: DC | PRN

## 2013-03-03 MED ORDER — SODIUM CHLORIDE 0.9 % IR SOLN
Status: DC | PRN
  Administered 2013-03-03: 1

## 2013-03-03 MED ORDER — OXYCODONE HCL 5 MG PO TABS: 5.0000 mg | ORAL_TABLET | Freq: Once | ORAL | Status: DC | PRN

## 2013-03-03 MED ORDER — ONDANSETRON HCL 4 MG/2ML IJ SOLN: 4.0000 mg | Freq: Four times a day (QID) | INTRAMUSCULAR | Status: DC | PRN

## 2013-03-03 MED ORDER — VANCOMYCIN HCL 1000 MG IV SOLR
1000.0000 mg | INTRAVENOUS | Status: DC | PRN
  Administered 2013-03-03: 1250 mg via INTRAVENOUS

## 2013-03-03 SURGICAL SUPPLY — 47 items
BANDAGE ELASTIC 4 VELCRO ST LF (GAUZE/BANDAGES/DRESSINGS) ×2 IMPLANT
BANDAGE ELASTIC 6 VELCRO ST LF (GAUZE/BANDAGES/DRESSINGS) ×2 IMPLANT
BANDAGE GAUZE ELAST BULKY 4 IN (GAUZE/BANDAGES/DRESSINGS) ×2 IMPLANT
BLADE SURG 10 STRL SS (BLADE) ×2 IMPLANT
BLADE SURG CLIPPER 3M 9600 (MISCELLANEOUS) ×2 IMPLANT
BNDG COHESIVE 4X5 TAN STRL (GAUZE/BANDAGES/DRESSINGS) ×2 IMPLANT
BNDG GAUZE STRTCH 6 (GAUZE/BANDAGES/DRESSINGS) ×6 IMPLANT
BRUSH SCRUB DISP (MISCELLANEOUS) ×4 IMPLANT
CLOTH BEACON ORANGE TIMEOUT ST (SAFETY) ×2 IMPLANT
COVER SURGICAL LIGHT HANDLE (MISCELLANEOUS) ×2 IMPLANT
DRAPE C-ARMOR (DRAPES) IMPLANT
DRAPE U-SHAPE 47X51 STRL (DRAPES) ×2 IMPLANT
DRSG ADAPTIC 3X8 NADH LF (GAUZE/BANDAGES/DRESSINGS) ×2 IMPLANT
DRSG MEPILEX BORDER 4X8 (GAUZE/BANDAGES/DRESSINGS) ×2 IMPLANT
ELECT CAUTERY BLADE 6.4 (BLADE) IMPLANT
ELECT REM PT RETURN 9FT ADLT (ELECTROSURGICAL)
ELECTRODE REM PT RTRN 9FT ADLT (ELECTROSURGICAL) IMPLANT
GLOVE BIO SURGEON STRL SZ7.5 (GLOVE) ×2 IMPLANT
GLOVE BIO SURGEON STRL SZ8 (GLOVE) ×2 IMPLANT
GLOVE BIOGEL PI IND STRL 7.5 (GLOVE) ×1 IMPLANT
GLOVE BIOGEL PI IND STRL 8 (GLOVE) ×1 IMPLANT
GLOVE BIOGEL PI INDICATOR 7.5 (GLOVE) ×1
GLOVE BIOGEL PI INDICATOR 8 (GLOVE) ×1
GOWN PREVENTION PLUS XLARGE (GOWN DISPOSABLE) ×2 IMPLANT
GOWN STRL NON-REIN LRG LVL3 (GOWN DISPOSABLE) ×2 IMPLANT
HANDPIECE INTERPULSE COAX TIP (DISPOSABLE)
IV NS IRRIG 3000ML ARTHROMATIC (IV SOLUTION) ×2 IMPLANT
KIT BASIN OR (CUSTOM PROCEDURE TRAY) ×2 IMPLANT
KIT ROOM TURNOVER OR (KITS) ×2 IMPLANT
MANIFOLD NEPTUNE II (INSTRUMENTS) ×2 IMPLANT
NS IRRIG 1000ML POUR BTL (IV SOLUTION) ×2 IMPLANT
PACK ORTHO EXTREMITY (CUSTOM PROCEDURE TRAY) ×2 IMPLANT
PAD ARMBOARD 7.5X6 YLW CONV (MISCELLANEOUS) ×4 IMPLANT
PADDING CAST COTTON 6X4 STRL (CAST SUPPLIES) ×2 IMPLANT
SET HNDPC FAN SPRY TIP SCT (DISPOSABLE) IMPLANT
SPONGE GAUZE 4X4 12PLY (GAUZE/BANDAGES/DRESSINGS) ×2 IMPLANT
SPONGE LAP 18X18 X RAY DECT (DISPOSABLE) ×2 IMPLANT
STOCKINETTE IMPERVIOUS 9X36 MD (GAUZE/BANDAGES/DRESSINGS) ×2 IMPLANT
SUT PDS AB 2-0 CT1 27 (SUTURE) IMPLANT
TOWEL OR 17X24 6PK STRL BLUE (TOWEL DISPOSABLE) ×2 IMPLANT
TOWEL OR 17X26 10 PK STRL BLUE (TOWEL DISPOSABLE) ×4 IMPLANT
TUBE ANAEROBIC SPECIMEN COL (MISCELLANEOUS) IMPLANT
TUBE CONNECTING 12X1/4 (SUCTIONS) ×2 IMPLANT
TUBING CYSTO DISP (UROLOGICAL SUPPLIES) ×2 IMPLANT
UNDERPAD 30X30 INCONTINENT (UNDERPADS AND DIAPERS) ×2 IMPLANT
WATER STERILE IRR 1000ML POUR (IV SOLUTION) IMPLANT
YANKAUER SUCT BULB TIP NO VENT (SUCTIONS) ×2 IMPLANT

## 2013-03-03 NOTE — Transfer of Care (Signed)
Immediate Anesthesia Transfer of Care Note  Patient: [name]  Procedure(s) Performed: Procedure(s): REPEAT Irrigation and DRAINAGE OF LEFT LEG (Left) APPLICATION OF WOUND VAC (Left)  Patient Location: PACU  Anesthesia Type:General  Level of Consciousness: awake, alert  and oriented  Airway & Oxygen Therapy: Patient Spontanous Breathing  Post-op Assessment: Report given to PACU RN and Post -op Vital signs reviewed and stable  Post vital signs: Reviewed and stable  Complications: No apparent anesthesia complications

## 2013-03-03 NOTE — Anesthesia Procedure Notes (Signed)
Procedure Name: LMA Insertion Date/Time: 03/03/2013 11:40 AM Performed by: Brien Mates DOBSON Pre-anesthesia Checklist: Patient identified, Emergency Drugs available, Suction available, Patient being monitored and Timeout performed Patient Re-evaluated:Patient Re-evaluated prior to inductionOxygen Delivery Method: Circle system utilized Preoxygenation: Pre-oxygenation with 100% oxygen Intubation Type: IV induction Ventilation: Mask ventilation without difficulty LMA: LMA inserted LMA Size: 4.0 Tube type: Oral Placement Confirmation: positive ETCO2 and breath sounds checked- equal and bilateral Tube secured with: Tape Dental Injury: Teeth and Oropharynx as per pre-operative assessment

## 2013-03-03 NOTE — Preoperative (Signed)
Beta Blockers   Reason not to administer Beta Blockers:Not Applicable 
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
## 2013-03-03 NOTE — Anesthesia Postprocedure Evaluation (Signed)
Anesthesia Post Note  Patient: [name]  Procedure(s) Performed: Procedure(s) (LRB): REPEAT Irrigation and DRAINAGE OF LEFT LEG (Left) APPLICATION OF WOUND VAC (Left)  Anesthesia type: General  Patient location: PACU  Post pain: Pain level controlled and Adequate analgesia  Post assessment: Post-op Vital signs reviewed, Patient's Cardiovascular Status Stable, Respiratory Function Stable, Patent Airway and Pain level controlled  Last Vitals:  Filed Vitals:   03/03/13 1354  BP:   Pulse:   Temp: 36.6 C  Resp:     Post vital signs: Reviewed and stable  Level of consciousness: awake, alert  and oriented  Complications: No apparent anesthesia complications

## 2013-03-03 NOTE — Progress Notes (Signed)
Patient ID: [name], male   DOB: 1976-06-30, 37 y.o.   MRN: [id number]   LOS: 5 days   Subjective: No new c/o, oral pain meds not effective enough, needing regular IV breakthrough Dilaudid   Objective: Vital signs in last 24 hours: Temp:  [98 F (36.7 C)-101.8 F (38.8 C)] 98 F (36.7 C) (05/29 0532) Pulse Rate:  [109-136] 109 (05/29 0532) Resp:  [16-18] 16 (05/29 0532) BP: (114-133)/(62-75) 114/75 mmHg (05/29 0532) SpO2:  [98 %-100 %] 100 % (05/29 0532) Last BM Date: 02/26/13   Physical Exam General appearance: alert and no distress Resp: clear to auscultation bilaterally Cardio: regular rate and rhythm GI: normal findings: bowel sounds normal and soft, non-tender Pulses: 2+ and symmetric   Assessment/Plan: [facility]  Left pop art/vein injuries s/p fem-pop bypass, fasciotomies -- per VVS  Open left tib/fib fxs s/p ex fix -- For OR today by Dr. Carola Frost  ABL anemia -- Moderate, check tomorrow FEN -- Add scheduled tramadol, check BMET tomorrow K+ VTE -- Lovenox, right SCD  Dispo -- OR today    Freeman Caldron, PA-C Pager: [phone] General Trauma PA Pager: [phone]   03/03/2013
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
## 2013-03-03 NOTE — Progress Notes (Signed)
I have seen and examined the patient. I agree with the findings above.  I discussed with the patient the risks and benefits of surgery, including the possibility of infection, nerve injury, vessel injury, wound breakdown, arthritis, symptomatic hardware, DVT/ PE, loss of motion, and need for further surgery among others.  We also specifically discussed the elevated risk of soft tissue breakdown that could lead to amputation.  He understood these risks and wished to proceed.   Budd Palmer, MD 03/03/2013 11:08 AM

## 2013-03-03 NOTE — Progress Notes (Signed)
Rec'd report from [facility], assuming care of patient for lunch relief

## 2013-03-03 NOTE — Anesthesia Preprocedure Evaluation (Addendum)
Anesthesia Evaluation  Patient identified by MRN, date of birth, ID band Patient awake    Reviewed: Allergy & Precautions, H&P , NPO status , Patient's Chart, lab work & pertinent test results  Airway Mallampati: II TM Distance: >3 FB Neck ROM: full    Dental  (+) Dental Advisory Given   Pulmonary Current Smoker,          Cardiovascular + Peripheral Vascular Disease     Neuro/Psych negative neurological ROS  negative psych ROS   GI/Hepatic GERD-  Medicated and Controlled,(+)     substance abuse  marijuana use,   Endo/Other  negative endocrine ROS  Renal/GU negative Renal ROS     Musculoskeletal negative musculoskeletal ROS (+)   Abdominal   Peds  Hematology negative hematology ROS (+)   Anesthesia Other Findings   Reproductive/Obstetrics negative OB ROS                         Anesthesia Physical Anesthesia Plan  ASA: II  Anesthesia Plan: General   Post-op Pain Management:    Induction: Intravenous  Airway Management Planned: LMA  Additional Equipment:   Intra-op Plan:   Post-operative Plan:   Informed Consent: I have reviewed the patients History and Physical, chart, labs and discussed the procedure including the risks, benefits and alternatives for the proposed anesthesia with the patient or authorized representative who has indicated his/her understanding and acceptance.     Plan Discussed with: CRNA, Anesthesiologist and Surgeon  Anesthesia Plan Comments:         Anesthesia Quick Evaluation

## 2013-03-03 NOTE — Brief Op Note (Signed)
02/26/2013 - 03/03/2013  12:58 PM  PATIENT:  [name]  37 y.o. male  PRE-OPERATIVE DIAGNOSIS:  open left tibia fracture  POST-OPERATIVE DIAGNOSIS:  open left tibia fracture  PROCEDURE:  Procedure(s): REPEAT Irrigation and DRAINAGE OF LEFT LEG (Left) APPLICATION OF WOUND VAC (Left)  SURGEON:  Surgeon(s) and Role:    * Budd Palmer, MD - Primary  PHYSICIAN ASSISTANT: Montez Morita, [facility]  ANESTHESIA:   general  EBL:  Total I/O In: 1100 [I.V.:1100] Out: 15 [Blood:15]  BLOOD ADMINISTERED:none  DRAINS: large wound vac   LOCAL MEDICATIONS USED:  NONE  SPECIMEN:  No Specimen  DISPOSITION OF SPECIMEN:  N/A  COUNTS:  YES  TOURNIQUET:  * No tourniquets in log *  DICTATION: .Other Dictation: Dictation Number [id number]  PLAN OF CARE: Admit to inpatient   PATIENT DISPOSITION:  PACU - hemodynamically stable.   Delay start of Pharmacological VTE agent (>24hrs) due to surgical blood loss or risk of bleeding: no

## 2013-03-03 NOTE — Progress Notes (Signed)
Heading to OR with Dr. Carola Frost with ongoing plans P for LLE. Patient examined and I agree with the assessment and plan  Violeta Gelinas, MD, MPH, FACS Pager: [phone]  03/03/2013 11:57 AM

## 2013-03-04 ENCOUNTER — Encounter (HOSPITAL_COMMUNITY): Payer: Self-pay | Admitting: Orthopedic Surgery

## 2013-03-04 LAB — BASIC METABOLIC PANEL
CO2: 26 mEq/L (ref 19–32)
Calcium: 8.4 mg/dL (ref 8.4–10.5)
Sodium: 135 mEq/L (ref 135–145)

## 2013-03-04 LAB — CBC
MCH: 30.3 pg (ref 26.0–34.0)
Platelets: 240 10*3/uL (ref 150–400)
RBC: 3.1 MIL/uL — ABNORMAL LOW (ref 4.22–5.81)
WBC: 8.9 10*3/uL (ref 4.0–10.5)

## 2013-03-04 NOTE — Progress Notes (Signed)
Physical Therapy Treatment Patient Details Name: [name] MRN: [id number] DOB: January 12, 1976 Today's Date: 03/04/2013 Time: 4098-1191 PT Time Calculation (min): 16 min  PT Assessment / Plan / Recommendation Comments on Treatment Session  pt slowly progressing with therapy. Tends to be impulsive with transfers and requires max encouragement to participate. Per pt he may be returning to OR tuesday. Will cont to f/u with pt to maximize functional mobility.    Follow Up Recommendations  Supervision/Assistance - 24 hour;Other (comment)     Does the patient have the potential to tolerate intense rehabilitation     Barriers to Discharge        Equipment Recommendations  Rolling walker with 5" wheels;Wheelchair (measurements PT);Wheelchair cushion (measurements PT)    Recommendations for Other Services    Frequency Min 5X/week   Plan Discharge plan remains appropriate;Frequency remains appropriate    Precautions / Restrictions Precautions Precautions: Fall Precaution Comments: external fixator LLE; with foot strap to keep L LE off ground Restrictions Weight Bearing Restrictions: Yes LLE Weight Bearing: Non weight bearing   Pertinent Vitals/Pain 9/10 pt repositioned with L LE elevated in chair.    Mobility  Bed Mobility Bed Mobility: Supine to Sit;Sitting - Scoot to Edge of Bed Supine to Sit: 6: Modified independent (Device/Increase time);HOB elevated;With rails Sitting - Scoot to Edge of Bed: 6: Modified independent (Device/Increase time) Details for Bed Mobility Assistance: requires rails, HOB elevated and increased time due to pain Transfers Transfers: Sit to Stand;Stand to Sit Sit to Stand: From bed;5: Supervision Stand to Sit: 5: Supervision;With armrests;With upper extremity assist;To chair/3-in-1 Details for Transfer Assistance: verbal cues for hand placement and safety; pt impulsive with transfer requires supervision for safety and cues  Ambulation/Gait Ambulation/Gait  Assistance: 4: Min guard Ambulation Distance (Feet): 4 Feet Assistive device: Rolling walker Ambulation/Gait Assistance Details: verbal cues to keep L LE off ground; pt unable to have foot strap around ball of foot due to pain secondary to bunion; requires cues for sequencing and max encouragement  Gait Pattern:  (hop to) Gait velocity: decreased due to pain  Stairs: No Wheelchair Mobility Wheelchair Mobility: No    Exercises     PT Diagnosis:    PT Problem List:   PT Treatment Interventions:     PT Goals Acute Rehab PT Goals PT Goal Formulation: With patient Time For Goal Achievement: 03/09/13 Potential to Achieve Goals: Good PT Goal: Supine/Side to Sit - Progress: Met PT Goal: Sit to Stand - Progress: Progressing toward goal PT Goal: Stand to Sit - Progress: Progressing toward goal PT Goal: Ambulate - Progress: Progressing toward goal  Visit Information  Last PT Received On: 03/04/13 Assistance Needed: +1 PT/OT Co-Evaluation/Treatment: Yes    Subjective Data  Subjective: pt lying supine; agreeable to therapy with max encouragement Patient Stated Goal: home with mom   Cognition  Cognition Arousal/Alertness: Awake/alert Behavior During Therapy: Impulsive Overall Cognitive Status: Within Functional Limits for tasks assessed    Balance  Static Standing Balance Static Standing - Balance Support: Bilateral upper extremity supported;During functional activity Static Standing - Level of Assistance: 5: Stand by assistance Dynamic Standing Balance Dynamic Standing - Balance Support: Bilateral upper extremity supported;During functional activity Dynamic Standing - Level of Assistance: 5: Stand by assistance Dynamic Standing - Comments: pt would pick up RW to manipulate it; demo good balance on R LE only  End of Session PT - End of Session Equipment Utilized During Treatment: Gait belt;Other (comment) (wound vac, external fixator ) Activity Tolerance: Patient tolerated  treatment well Patient left: in chair;with call bell/phone within reach Nurse Communication: Mobility status   GP     Donell Sievert,  [phone] 03/04/2013, 12:18 PM

## 2013-03-04 NOTE — Progress Notes (Signed)
Trauma Service Note  Subjective: Patient is happy that his leg is doing better  Objective: Vital signs in last 24 hours: Temp:  [97.4 F (36.3 C)-100.8 F (38.2 C)] 98.4 F (36.9 C) (05/30 0544) Pulse Rate:  [106-125] 107 (05/30 0544) Resp:  [9-27] 18 (05/30 0544) BP: (125-157)/(75-92) 136/84 mmHg (05/30 0544) SpO2:  [97 %-100 %] 100 % (05/30 0544) Last BM Date: 02/25/13  Intake/Output from previous day: 05/29 0701 - 05/30 0700 In: 1490 [P.O.:240; I.V.:1250] Out: 2145 [Urine:2000; Drains:130; Blood:15] Intake/Output this shift: Total I/O In: 240 [P.O.:240] Out: 500 [Urine:500]  General: No acute distress  Lungs: Clear  Abd: Benign  Extremities: Left leg in External fixator, good dorsiflexion today with good sensation.  Neuro: Better in LLE  Lab Results: CBC   Recent Labs  03/02/13 0525 03/04/13 0535  WBC 11.9* 8.9  HGB 9.2* 9.4*  HCT 26.9* 27.8*  PLT 146* 240   BMET  Recent Labs  03/02/13 0525 03/04/13 0535  NA 135 135  K 4.7 3.9  CL 100 99  CO2 24 26  GLUCOSE 117* 137*  BUN 7 5*  CREATININE 0.92 0.98  CALCIUM 8.3* 8.4   PT/INR No results found for this basename: LABPROT, INR,  in the last 72 hours ABG No results found for this basename: PHART, PCO2, PO2, HCO3,  in the last 72 hours  Studies/Results: No results found.  Anti-infectives: Anti-infectives   Start     Dose/Rate Route Frequency Ordered Stop   02/28/13 1800  vancomycin (VANCOCIN) 1,250 mg in sodium chloride 0.9 % 250 mL IVPB     1,250 mg 166.7 mL/hr over 90 Minutes Intravenous Every 8 hours 02/28/13 1253     02/27/13 1200  vancomycin (VANCOCIN) IVPB 1000 mg/200 mL premix  Status:  Discontinued     1,000 mg 200 mL/hr over 60 Minutes Intravenous Every 8 hours 02/27/13 1148 02/28/13 1253   02/27/13 0800  levofloxacin (LEVAQUIN) IVPB 500 mg    Comments:  Received ancef at 0030 in OR   500 mg 100 mL/hr over 60 Minutes Intravenous  Once 02/27/13 0531 02/27/13 0905   02/26/13 2200   ceFAZolin (ANCEF) IVPB 2 g/50 mL premix  Status:  Discontinued     2 g 100 mL/hr over 30 Minutes Intravenous 3 times per day 02/26/13 1917 02/27/13 0531   02/26/13 1910  ceFAZolin (ANCEF) 2-3 GM-% IVPB SOLR    Comments:  MUNNETT, KRISTINA: cabinet override      02/26/13 1910 02/26/13 1918      Assessment/Plan: s/p Procedure(s): REPEAT Irrigation and DRAINAGE OF LEFT LEG APPLICATION OF WOUND VAC To go back to surgery next week per orthopedics. As a service the trauma team is not really adding to his care. Will discuss with Dr. Carola Frost about assuming care of this patient.  LOS: 6 days   Marta Lamas. Gae Bon, MD, FACS [phone] Trauma Surgeon 03/04/2013

## 2013-03-04 NOTE — Progress Notes (Addendum)
Occupational Therapy Treatment Patient Details Name: [name] MRN: [id number] DOB: 05/10/1976 Today's Date: 03/04/2013 Time: 7829-5621 OT Time Calculation (min): 15 min  OT Assessment / Plan / Recommendation Comments on Treatment Session Pt not tolerating foot strap in correct position. Pt transferred from bed to chair. OT educated on dressing technque and use of reacher.    Follow Up Recommendations  No OT follow up    Barriers to Discharge       Equipment Recommendations  3 in 1 bedside comode    Recommendations for Other Services    Frequency Min 2X/week   Plan Discharge plan remains appropriate    Precautions / Restrictions Precautions Precautions: Fall Precaution Comments: external fixator LLE; with foot strap to keep L LE off ground Restrictions Weight Bearing Restrictions: Yes LLE Weight Bearing: Non weight bearing   Pertinent Vitals/Pain 9/10 pt repositioned with L LE elevated in chair.     ADL  Toilet Transfer: Buyer, retail Method: Sit to Barista: Other (comment) (from bed) Toileting - Clothing Manipulation and Hygiene: Simulated;Minimal assistance Where Assessed - Engineer, mining and Hygiene: Standing Equipment Used: Gait belt;Rolling walker;Reacher (wound vac, external fixator) Transfers/Ambulation Related to ADLs: supervision for transfers and Minguard for ambulation ADL Comments: When OT arrived, pt had external fixator foot strapped placed around arch of foot versus on ball of foot. Pt not tolerating strap on ball of foot due to bunyon on foot. OT educated on importance of strap. OT educated on dressing technique and use of reacher.     OT Diagnosis:    OT Problem List:   OT Treatment Interventions:     OT Goals Acute Rehab OT Goals OT Goal Formulation: With patient Potential to Achieve Goals: Good ADL Goals Pt Will Perform Lower Body Dressing: with set-up;Unsupported;with adaptive  equipment;Sit to stand from chair;Sit to stand from bed Pt Will Transfer to Toilet: with supervision;Stand pivot transfer;Ambulation;with DME;3-in-1;Maintaining weight bearing status ADL Goal: Toilet Transfer - Progress: Progressing toward goals Pt Will Perform Toileting - Clothing Manipulation: with modified independence;Standing Pt Will Perform Toileting - Hygiene: with modified independence;Sit to stand from 3-in-1/toilet ADL Goal: Toileting - Hygiene - Progress: Progressing toward goals Miscellaneous OT Goals Miscellaneous OT Goal #1: Pt will be Mod I in and OOB for BADLs. OT Goal: Miscellaneous Goal #1 - Progress: Progressing toward goals Miscellaneous OT Goal #2: Pt will tolerate and understand the use of the foot strap on LLE. OT Goal: Miscellaneous Goal #2 - Progress: Not progressing  Visit Information  Last OT Received On: 03/04/13 Assistance Needed: +1 PT/OT Co-Evaluation/Treatment: Yes    Subjective Data      Prior Functioning       Cognition  Cognition Arousal/Alertness: Awake/alert Behavior During Therapy: Impulsive Overall Cognitive Status: Within Functional Limits for tasks assessed    Mobility  Bed Mobility Bed Mobility: Supine to Sit;Sitting - Scoot to Edge of Bed Supine to Sit: 6: Modified independent (Device/Increase time);HOB elevated;With rails Sitting - Scoot to Edge of Bed: 6: Modified independent (Device/Increase time) Details for Bed Mobility Assistance: requires rails, HOB elevated and increased time due to pain Transfers Transfers: Sit to Stand;Stand to Sit Sit to Stand: From bed;5: Supervision Stand to Sit: 5: Supervision;With armrests;With upper extremity assist;To chair/3-in-1 Details for Transfer Assistance: verbal cues for hand placement and safety; pt impulsive with transfer requires supervision for safety and cues     Exercises    Balance   End of Session OT - End of Session Equipment Utilized  During Treatment: Gait belt;Other (comment)  (wound vac, external fixator) Patient left: in chair;with call bell/phone within reach Nurse Communication: Other (comment) (IV disconnected)  GO     Earlie Raveling OTR/L [phone] 03/04/2013, 1:06 PM
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
## 2013-03-04 NOTE — Progress Notes (Signed)
[facility]   Subjective  - He has increased toe and ankle movement on the left.   Objective 136/84 107 98.4 F (36.9 C) (Oral) 18 100%  Intake/Output Summary (Last 24 hours) at 03/04/13 0752 Last data filed at 03/04/13 1610  Gross per 24 hour  Intake   1490 ml  Output   2020 ml  Net   -530 ml    Toes are warm to touch Palp DP pulse left Incision on the right LE are healing well  Assessment/Planning: Left distal superficial femoral artery to posterior tibial artery bypass graft with contralateral reversed greater saphenous vein. #2: 4 compartment fasciotomy Wound vac was changed by Dr. Carola Frost yesterday.    Clinton Gallant [facility] 03/04/2013 7:52 AM --  Laboratory Lab Results:  Recent Labs  03/02/13 0525 03/04/13 0535  WBC 11.9* 8.9  HGB 9.2* 9.4*  HCT 26.9* 27.8*  PLT 146* 240   BMET  Recent Labs  03/02/13 0525 03/04/13 0535  NA 135 135  K 4.7 3.9  CL 100 99  CO2 24 26  GLUCOSE 117* 137*  BUN 7 5*  CREATININE 0.92 0.98  CALCIUM 8.3* 8.4    COAG Lab Results  Component Value Date   INR 0.93 02/26/2013   No results found for this basename: PTT

## 2013-03-04 NOTE — Progress Notes (Signed)
UR completed 

## 2013-03-05 LAB — VANCOMYCIN, TROUGH: Vancomycin Tr: 19.9 ug/mL (ref 10.0–20.0)

## 2013-03-05 MED ORDER — POLYETHYLENE GLYCOL 3350 17 G PO PACK
17.0000 g | PACK | Freq: Every day | ORAL | Status: DC | PRN
  Administered 2013-03-05 – 2013-03-06 (×2): 17 g via ORAL
  Filled 2013-03-05 (×2): qty 1

## 2013-03-05 MED ORDER — DOCUSATE SODIUM 100 MG PO CAPS
100.0000 mg | ORAL_CAPSULE | Freq: Two times a day (BID) | ORAL | Status: DC
  Administered 2013-03-05 – 2013-03-11 (×13): 100 mg via ORAL
  Filled 2013-03-05 (×14): qty 1

## 2013-03-05 MED ORDER — VANCOMYCIN HCL IN DEXTROSE 1-5 GM/200ML-% IV SOLN
1000.0000 mg | Freq: Three times a day (TID) | INTRAVENOUS | Status: DC
  Administered 2013-03-05 – 2013-03-07 (×6): 1000 mg via INTRAVENOUS
  Filled 2013-03-05 (×9): qty 200

## 2013-03-05 MED ORDER — FLEET ENEMA 7-19 GM/118ML RE ENEM: 1.0000 | ENEMA | Freq: Every day | RECTAL | Status: DC | PRN

## 2013-03-05 NOTE — Progress Notes (Signed)
Patient ID: [name], male   DOB: 08/19/1976, 37 y.o.   MRN: [id number] Trauma Service Note  Subjective: No comlaints, working with flexing and extending foot  Objective: Vital signs in last 24 hours: Temp:  [97.6 F (36.4 C)-98.9 F (37.2 C)] 97.6 F (36.4 C) (05/31 0647) Pulse Rate:  [105-127] 105 (05/31 0647) Resp:  [16-18] 18 (05/31 0647) BP: (130-140)/(74-79) 140/76 mmHg (05/31 0647) SpO2:  [96 %-100 %] 98 % (05/31 0647) Last BM Date: 02/25/13  Intake/Output from previous day: 05/30 0701 - 05/31 0700 In: 840 [P.O.:240; I.V.:600] Out: 1125 [Urine:975; Drains:150] Intake/Output this shift: Total I/O In: 1879.5 [I.V.:379.5; IV Piggyback:1500] Out: 100 [Drains:100]  General: No acute distress  Extremities: Left leg in External fixator, good dorsiflexion today with good sensation.  Neuro: Better in LLE  Lab Results: CBC   Recent Labs  03/04/13 0535  WBC 8.9  HGB 9.4*  HCT 27.8*  PLT 240   BMET  Recent Labs  03/04/13 0535  NA 135  K 3.9  CL 99  CO2 26  GLUCOSE 137*  BUN 5*  CREATININE 0.98  CALCIUM 8.4   PT/INR No results found for this basename: LABPROT, INR,  in the last 72 hours ABG No results found for this basename: PHART, PCO2, PO2, HCO3,  in the last 72 hours  Studies/Results: No results found.  Anti-infectives: Anti-infectives   Start     Dose/Rate Route Frequency Ordered Stop   02/28/13 1800  vancomycin (VANCOCIN) 1,250 mg in sodium chloride 0.9 % 250 mL IVPB     1,250 mg 166.7 mL/hr over 90 Minutes Intravenous Every 8 hours 02/28/13 1253     02/27/13 1200  vancomycin (VANCOCIN) IVPB 1000 mg/200 mL premix  Status:  Discontinued     1,000 mg 200 mL/hr over 60 Minutes Intravenous Every 8 hours 02/27/13 1148 02/28/13 1253   02/27/13 0800  levofloxacin (LEVAQUIN) IVPB 500 mg    Comments:  Received ancef at 0030 in OR   500 mg 100 mL/hr over 60 Minutes Intravenous  Once 02/27/13 0531 02/27/13 0905   02/26/13 2200  ceFAZolin (ANCEF) IVPB  2 g/50 mL premix  Status:  Discontinued     2 g 100 mL/hr over 30 Minutes Intravenous 3 times per day 02/26/13 1917 02/27/13 0531   02/26/13 1910  ceFAZolin (ANCEF) 2-3 GM-% IVPB SOLR    Comments:  MUNNETT, KRISTINA: cabinet override      02/26/13 1910 02/26/13 1918      Assessment/Plan: s/p Procedure(s): REPEAT Irrigation and DRAINAGE OF LEFT LEG APPLICATION OF WOUND VAC To go back to surgery next week per orthopedics. As a service the trauma team is not really adding to his care. Dr. Carola Frost will assume care of patient, we will sign off at this time.  LOS: 7 days   Zayn Selley 11:16 AM  03/05/2013
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
## 2013-03-05 NOTE — Progress Notes (Signed)
Patient ID: [name], male   DOB: October 13, 1975, 37 y.o.   MRN: [id number] The patient is comfortable. External fixator is in place. His foot is warm and well perfused.  Actually has better motor function in his foot although may still have a foot drop.  Nothing to add from vascular standpoint.
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
## 2013-03-05 NOTE — Progress Notes (Signed)
Transfer to Orthopedic surgery service.  [name]. Gae Bon, MD, FACS [phone] Trauma Surgeon

## 2013-03-05 NOTE — Progress Notes (Signed)
Physical Therapy Treatment Patient Details Name: [name] MRN: [id number] DOB: January 06, 1976 Today's Date: 03/05/2013 Time: 8295-6213 PT Time Calculation (min): 15 min  PT Assessment / Plan / Recommendation Comments on Treatment Session  Patient progressing with therapy this AM. Required some encouragement as he said he did not get the help that he needed yesterday    Follow Up Recommendations  Supervision/Assistance - 24 hour;Other (comment)     Does the patient have the potential to tolerate intense rehabilitation     Barriers to Discharge        Equipment Recommendations  Rolling walker with 5" wheels;Wheelchair (measurements PT);Wheelchair cushion (measurements PT)    Recommendations for Other Services    Frequency Min 5X/week   Plan Discharge plan remains appropriate;Frequency remains appropriate    Precautions / Restrictions Precautions Precautions: Fall Precaution Comments: external fixator LLE; with foot strap to keep L LE off ground Restrictions Weight Bearing Restrictions: Yes LLE Weight Bearing: Non weight bearing   Pertinent Vitals/Pain     Mobility  Bed Mobility Supine to Sit: 6: Modified independent (Device/Increase time);HOB elevated;With rails Sitting - Scoot to Edge of Bed: 6: Modified independent (Device/Increase time) Transfers Sit to Stand: From bed;5: Supervision;With upper extremity assist Stand to Sit: 5: Supervision;With armrests;With upper extremity assist;To chair/3-in-1 Details for Transfer Assistance: verbal cues for hand placement and safety; pt impulsive with transfer requires supervision for safety and cues  Ambulation/Gait Ambulation/Gait Assistance: 4: Min guard Ambulation Distance (Feet): 15 Feet Assistive device: Rolling walker Ambulation/Gait Assistance Details: Cues to keep L LE off of the ground and to increase safety with posture and RW Gait Pattern: Step-to pattern    Exercises     PT Diagnosis:    PT Problem List:   PT  Treatment Interventions:     PT Goals Acute Rehab PT Goals PT Goal: Supine/Side to Sit - Progress: Progressing toward goal PT Goal: Sit to Stand - Progress: Progressing toward goal PT Goal: Stand to Sit - Progress: Progressing toward goal PT Goal: Ambulate - Progress: Progressing toward goal  Visit Information  Last PT Received On: 03/05/13 Assistance Needed: +1    Subjective Data      Cognition  Cognition Arousal/Alertness: Awake/alert Behavior During Therapy: Impulsive Overall Cognitive Status: Within Functional Limits for tasks assessed    Balance     End of Session PT - End of Session Equipment Utilized During Treatment: Gait belt;Other (comment) Activity Tolerance: Patient tolerated treatment well Patient left: in chair;with call bell/phone within reach   GP     Fredrich Birks 03/05/2013, 10:08 AM 03/05/2013 Fredrich Birks PTA [phone] pager [phone] office

## 2013-03-06 MED ORDER — BISACODYL 10 MG RE SUPP: 10.0000 mg | Freq: Every day | RECTAL | Status: DC | PRN
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
## 2013-03-06 NOTE — Progress Notes (Signed)
Patient offered several times this shift a laxative product - patient refused. Abd.soft ,,+BS. Had small, hard BM Friday per patient. Given prune juice, fluids.

## 2013-03-07 LAB — VANCOMYCIN, TROUGH: Vancomycin Tr: 7.6 ug/mL — ABNORMAL LOW (ref 10.0–20.0)

## 2013-03-07 MED ORDER — LACTATED RINGERS IV SOLN
INTRAVENOUS | Status: DC
  Administered 2013-03-07: 14:00:00 via INTRAVENOUS

## 2013-03-07 MED ORDER — VANCOMYCIN HCL 10 G IV SOLR
1250.0000 mg | Freq: Three times a day (TID) | INTRAVENOUS | Status: DC
  Administered 2013-03-07 – 2013-03-11 (×9): 1250 mg via INTRAVENOUS
  Filled 2013-03-07 (×13): qty 1250
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
## 2013-03-07 NOTE — Progress Notes (Signed)
ANTIBIOTIC CONSULT NOTE - FOLLOW UP  Pharmacy Consult for Vancomycin Indication: open tibia fracture with external fixation  Allergies  Allergen Reactions  . Flexeril (Cyclobenzaprine) Other (See Comments)    Makes him extremely agitated.   [name] Penicillins Itching and Rash    Patient Measurements: Height: 5\' 9"  (175.3 cm) Weight: 198 lb 10.2 oz (90.1 kg) IBW/kg (Calculated) : 70.7  Vital Signs: Temp: 98.1 F (36.7 C) (06/02 0614) BP: 127/71 mmHg (06/01 2135) Pulse Rate: 87 (06/02 0614) Intake/Output from previous day: 06/01 0701 - 06/02 0700 In: 3646.7 [P.O.:2000; I.V.:1246.7; IV Piggyback:400] Out: 3150 [Urine:3150] Intake/Output from this shift:    Labs: No results found for this basename: WBC, HGB, PLT, LABCREA, CREATININE,  in the last 72 hours Estimated Creatinine Clearance: 114.6 ml/min (by C-G formula based on Cr of 0.98).  Recent Labs  03/05/13 1115  VANCOTROUGH 19.9     Microbiology: Recent Results (from the past 720 hour(s))  MRSA PCR SCREENING     Status: None   Collection Time    02/27/13  4:15 AM      Result Value Range Status   MRSA by PCR NEGATIVE  NEGATIVE Final   Comment:            The GeneXpert MRSA Assay (FDA     approved for NASAL specimens     only), is one component of a     comprehensive MRSA colonization     surveillance program. It is not     intended to diagnose MRSA     infection nor to guide or     monitor treatment for     MRSA infections.    Anti-infectives   Start     Dose/Rate Route Frequency Ordered Stop   03/05/13 1400  vancomycin (VANCOCIN) IVPB 1000 mg/200 mL premix     1,000 mg 200 mL/hr over 60 Minutes Intravenous Every 8 hours 03/05/13 1251     02/28/13 1800  vancomycin (VANCOCIN) 1,250 mg in sodium chloride 0.9 % 250 mL IVPB  Status:  Discontinued     1,250 mg 166.7 mL/hr over 90 Minutes Intravenous Every 8 hours 02/28/13 1253 03/05/13 1251   02/27/13 1200  vancomycin (VANCOCIN) IVPB 1000 mg/200 mL premix   Status:  Discontinued     1,000 mg 200 mL/hr over 60 Minutes Intravenous Every 8 hours 02/27/13 1148 02/28/13 1253   02/27/13 0800  levofloxacin (LEVAQUIN) IVPB 500 mg    Comments:  Received ancef at 0030 in OR   500 mg 100 mL/hr over 60 Minutes Intravenous  Once 02/27/13 0531 02/27/13 0905   02/26/13 2200  ceFAZolin (ANCEF) IVPB 2 g/50 mL premix  Status:  Discontinued     2 g 100 mL/hr over 30 Minutes Intravenous 3 times per day 02/26/13 1917 02/27/13 0531   02/26/13 1910  ceFAZolin (ANCEF) 2-3 GM-% IVPB SOLR    Comments:  MUNNETT, KRISTINA: cabinet override      02/26/13 1910 02/26/13 1918      Assessment: 37 year old male who continues on Vancomycin for open fracture prophylaxis.  Vanc dose adjusted 5/31.  Note his fracture remains open at this time and Vancomycin is to continue.  Goal of Therapy:  Vancomycin trough level 15-20 mcg/ml  Plan:  Check vanc trough later today   Shayon Trompeter, Pharm.D. Clinical Pharmacist  Phone [phone] 03/07/2013, 8:42 AM

## 2013-03-07 NOTE — Progress Notes (Signed)
OT Cancellation Note  Patient Details Name: [name] MRN: [id number] DOB: 03-06-1976   Cancelled Treatment:    Reason Eval/Treat Not Completed: Pain limiting ability to participate. Pt adamantly refusing any therapy today  Galen Manila 03/07/2013, 11:28 AM

## 2013-03-07 NOTE — Progress Notes (Signed)
Pt is not compliant  with elevating his lt leg. He is verbally abusive and uses foul language to express his needs. He went to sleep shortly after he took his bedtime medication therefore we did not wake him at  for  him his midnight pain medication. Patient slept until 0600 hrs this am.

## 2013-03-07 NOTE — Progress Notes (Signed)
ANTIBIOTIC CONSULT NOTE - FOLLOW UP  Pharmacy Consult for Vancomycin Indication: open tibia fracture with external fixation  Allergies  Allergen Reactions  . Flexeril (Cyclobenzaprine) Other (See Comments)    Makes him extremely agitated.   [name] Penicillins Itching and Rash    Patient Measurements: Height: 5\' 9"  (175.3 cm) Weight: 198 lb 10.2 oz (90.1 kg) IBW/kg (Calculated) : 70.7  Vital Signs: Temp: 98.7 F (37.1 C) (06/02 1503) BP: 120/76 mmHg (06/02 1503) Pulse Rate: 113 (06/02 1503) Intake/Output from previous day: 06/01 0701 - 06/02 0700 In: 3646.7 [P.O.:2000; I.V.:1246.7; IV Piggyback:400] Out: 3150 [Urine:3150] Intake/Output from this shift: Total I/O In: 240 [P.O.:240] Out: 1200 [Urine:1200]  Labs: No results found for this basename: WBC, HGB, PLT, LABCREA, CREATININE,  in the last 72 hours Estimated Creatinine Clearance: 114.6 ml/min (by C-G formula based on Cr of 0.98).  Recent Labs  03/05/13 1115 03/07/13 1400  VANCOTROUGH 19.9 7.6*     Microbiology: Recent Results (from the past 720 hour(s))  MRSA PCR SCREENING     Status: None   Collection Time    02/27/13  4:15 AM      Result Value Range Status   MRSA by PCR NEGATIVE  NEGATIVE Final   Comment:            The GeneXpert MRSA Assay (FDA     approved for NASAL specimens     only), is one component of a     comprehensive MRSA colonization     surveillance program. It is not     intended to diagnose MRSA     infection nor to guide or     monitor treatment for     MRSA infections.    Assessment: 37 year old male who continues on Vancomycin day # 9 for open fracture prophylaxis.   Note his fracture remains open at this time and Vancomycin is to continue. Vancomycin trough today = 7.6 mcg/ml on 1 gm IV q8h.  Vanc trough is below desired goal.  Goal of Therapy:  Vancomycin trough level 15-20 mcg/ml  Plan:  Increase vancomycin back to 1250 mg IV q8h Anticipate rechecking trough in 2-3 days   Monitor renal function Herby Abraham, Pharm.D. [phone] 03/07/2013 4:04 PM

## 2013-03-07 NOTE — Progress Notes (Signed)
PT Cancellation Note  Patient Details Name: [name] MRN: [id number] DOB: 1976/05/25   Cancelled Treatment:    Reason Eval/Treat Not Completed: Pain limiting ability to participate. Pt adamantly refusing any therapy today. States he is not moving at all today. Will re-attempt at a later time.    Donnamarie Poag [place], [place] [phone] 03/07/2013, 11:49 AM

## 2013-03-08 ENCOUNTER — Encounter (HOSPITAL_COMMUNITY): Admission: EM | Disposition: A | Discharge status: Skilled Nursing Facility | Payer: Self-pay | Source: Home / Self Care

## 2013-03-08 ENCOUNTER — Encounter (HOSPITAL_COMMUNITY): Payer: Self-pay | Admitting: Anesthesiology

## 2013-03-08 ENCOUNTER — Inpatient Hospital Stay (HOSPITAL_COMMUNITY): Payer: Medicare Other

## 2013-03-08 ENCOUNTER — Inpatient Hospital Stay (HOSPITAL_COMMUNITY): Payer: Medicare Other | Admitting: Anesthesiology

## 2013-03-08 HISTORY — PX: EXTERNAL FIXATION REMOVAL: SHX5040

## 2013-03-08 HISTORY — PX: SKIN SPLIT GRAFT: SHX444

## 2013-03-08 HISTORY — PX: ORIF TIBIA PLATEAU: SHX2132

## 2013-03-08 HISTORY — PX: APPLICATION OF WOUND VAC: SHX5189

## 2013-03-08 LAB — CBC
HCT: 26.5 % — ABNORMAL LOW (ref 39.0–52.0)
Hemoglobin: 8.8 g/dL — ABNORMAL LOW (ref 13.0–17.0)
MCH: 30 pg (ref 26.0–34.0)
MCHC: 32.6 g/dL (ref 30.0–36.0)
MCV: 91.4 fL (ref 78.0–100.0)
Platelets: 451 10*3/uL — ABNORMAL HIGH (ref 150–400)
Platelets: 510 10*3/uL — ABNORMAL HIGH (ref 150–400)
RBC: 2.9 MIL/uL — ABNORMAL LOW (ref 4.22–5.81)
RDW: 15.4 % (ref 11.5–15.5)
WBC: 12.9 10*3/uL — ABNORMAL HIGH (ref 4.0–10.5)

## 2013-03-08 LAB — CREATININE, SERUM: GFR calc Af Amer: >90 mL/min (ref >90)

## 2013-03-08 LAB — COMPREHENSIVE METABOLIC PANEL
ALT: 14 U/L (ref 0–53)
AST: 18 U/L (ref 0–37)
CO2: 27 mEq/L (ref 19–32)
Chloride: 99 mEq/L (ref 96–112)
Creatinine, Ser: 1.05 mg/dL (ref 0.50–1.35)
GFR calc Af Amer: >90 mL/min (ref >90)
GFR calc non Af Amer: 89 mL/min — ABNORMAL LOW (ref >90)
Glucose, Bld: 115 mg/dL — ABNORMAL HIGH (ref 70–99)
Sodium: 134 mEq/L — ABNORMAL LOW (ref 135–145)
Total Bilirubin: 0.3 mg/dL (ref 0.3–1.2)

## 2013-03-08 SURGERY — APPLICATION, GRAFT, SKIN, SPLIT-THICKNESS
Anesthesia: General | Site: Leg Upper | Laterality: Right | Wound class: Dirty or Infected

## 2013-03-08 MED ORDER — VECURONIUM BROMIDE 10 MG IV SOLR
INTRAVENOUS | Status: DC | PRN
  Administered 2013-03-08 (×2): 2 mg via INTRAVENOUS
  Administered 2013-03-08: 3 mg via INTRAVENOUS
  Administered 2013-03-08: 2 mg via INTRAVENOUS
  Administered 2013-03-08: 3 mg via INTRAVENOUS

## 2013-03-08 MED ORDER — VANCOMYCIN HCL 1000 MG IV SOLR
INTRAVENOUS | Status: AC
  Filled 2013-03-08: qty 1000

## 2013-03-08 MED ORDER — OXYCODONE HCL 5 MG PO TABS: 5.0000 mg | ORAL_TABLET | Freq: Once | ORAL | Status: DC | PRN

## 2013-03-08 MED ORDER — SODIUM CHLORIDE 0.9 % IR SOLN
Status: DC | PRN
  Administered 2013-03-08: 1000 mL

## 2013-03-08 MED ORDER — LACTATED RINGERS IV SOLN
INTRAVENOUS | Status: DC | PRN
  Administered 2013-03-08 (×2): via INTRAVENOUS

## 2013-03-08 MED ORDER — HYDROMORPHONE HCL PF 1 MG/ML IJ SOLN
INTRAMUSCULAR | Status: AC
  Filled 2013-03-08: qty 1

## 2013-03-08 MED ORDER — TOBRAMYCIN SULFATE 1.2 G IJ SOLR
INTRAMUSCULAR | Status: DC | PRN
  Administered 2013-03-08: 1.2 g

## 2013-03-08 MED ORDER — TOBRAMYCIN SULFATE 1.2 G IJ SOLR
INTRAMUSCULAR | Status: AC
  Filled 2013-03-08: qty 1.2

## 2013-03-08 MED ORDER — GLYCOPYRROLATE 0.2 MG/ML IJ SOLN
INTRAMUSCULAR | Status: DC | PRN
  Administered 2013-03-08: 0.4 mg via INTRAVENOUS

## 2013-03-08 MED ORDER — NEOSTIGMINE METHYLSULFATE 1 MG/ML IJ SOLN
INTRAMUSCULAR | Status: DC | PRN
  Administered 2013-03-08: 3 mg via INTRAVENOUS

## 2013-03-08 MED ORDER — DEXTROSE 5 % IV SOLN
INTRAVENOUS | Status: DC | PRN
  Administered 2013-03-08: 14:00:00 via INTRAVENOUS

## 2013-03-08 MED ORDER — PROMETHAZINE HCL 25 MG/ML IJ SOLN: 6.2500 mg | INTRAMUSCULAR | Status: DC | PRN

## 2013-03-08 MED ORDER — ONDANSETRON HCL 4 MG/2ML IJ SOLN: 4.0000 mg | Freq: Four times a day (QID) | INTRAMUSCULAR | Status: DC | PRN

## 2013-03-08 MED ORDER — ALBUMIN HUMAN 5 % IV SOLN
INTRAVENOUS | Status: DC | PRN
  Administered 2013-03-08: 16:00:00 via INTRAVENOUS

## 2013-03-08 MED ORDER — ROCURONIUM BROMIDE 100 MG/10ML IV SOLN
INTRAVENOUS | Status: DC | PRN
  Administered 2013-03-08: 40 mg via INTRAVENOUS
  Administered 2013-03-08: 10 mg via INTRAVENOUS

## 2013-03-08 MED ORDER — LACTATED RINGERS IV SOLN
INTRAVENOUS | Status: DC
  Administered 2013-03-08: 12:00:00 via INTRAVENOUS

## 2013-03-08 MED ORDER — VANCOMYCIN HCL 500 MG IV SOLR
INTRAVENOUS | Status: DC | PRN
  Administered 2013-03-08: 500 mg

## 2013-03-08 MED ORDER — FENTANYL CITRATE 0.05 MG/ML IJ SOLN
INTRAMUSCULAR | Status: DC | PRN
  Administered 2013-03-08 (×2): 100 ug via INTRAVENOUS
  Administered 2013-03-08: 50 ug via INTRAVENOUS
  Administered 2013-03-08: 100 ug via INTRAVENOUS
  Administered 2013-03-08: 50 ug via INTRAVENOUS
  Administered 2013-03-08: 100 ug via INTRAVENOUS
  Administered 2013-03-08: 50 ug via INTRAVENOUS
  Administered 2013-03-08: 25 ug via INTRAVENOUS
  Administered 2013-03-08 (×3): 50 ug via INTRAVENOUS

## 2013-03-08 MED ORDER — METOCLOPRAMIDE HCL 5 MG/ML IJ SOLN: 5.0000 mg | Freq: Three times a day (TID) | INTRAMUSCULAR | Status: DC | PRN

## 2013-03-08 MED ORDER — CARISOPRODOL 350 MG PO TABS
350.0000 mg | ORAL_TABLET | Freq: Three times a day (TID) | ORAL | Status: DC
  Administered 2013-03-08 – 2013-03-10 (×5): 350 mg via ORAL
  Filled 2013-03-08 (×5): qty 1

## 2013-03-08 MED ORDER — BUPIVACAINE-EPINEPHRINE PF 0.25-1:200000 % IJ SOLN
INTRAMUSCULAR | Status: AC
  Filled 2013-03-08: qty 30

## 2013-03-08 MED ORDER — VANCOMYCIN HCL 500 MG IV SOLR
INTRAVENOUS | Status: AC
  Filled 2013-03-08: qty 1000

## 2013-03-08 MED ORDER — METHOCARBAMOL 100 MG/ML IJ SOLN
500.0000 mg | Freq: Four times a day (QID) | INTRAMUSCULAR | Status: DC
  Filled 2013-03-08 (×2): qty 10

## 2013-03-08 MED ORDER — MIDAZOLAM HCL 5 MG/5ML IJ SOLN
INTRAMUSCULAR | Status: DC | PRN
  Administered 2013-03-08 (×2): 1 mg via INTRAVENOUS
  Administered 2013-03-08: 2 mg via INTRAVENOUS

## 2013-03-08 MED ORDER — HYDROMORPHONE HCL PF 1 MG/ML IJ SOLN
0.2500 mg | INTRAMUSCULAR | Status: DC | PRN
  Administered 2013-03-08 (×4): 0.5 mg via INTRAVENOUS

## 2013-03-08 MED ORDER — TOBRAMYCIN SULFATE 1.2 G IJ SOLR: INTRAMUSCULAR | Status: DC | PRN

## 2013-03-08 MED ORDER — PROPOFOL 10 MG/ML IV BOLUS
INTRAVENOUS | Status: DC | PRN
  Administered 2013-03-08: 130 mg via INTRAVENOUS

## 2013-03-08 MED ORDER — ACETAMINOPHEN 10 MG/ML IV SOLN
1000.0000 mg | Freq: Four times a day (QID) | INTRAVENOUS | Status: DC
  Administered 2013-03-08 – 2013-03-09 (×3): 1000 mg via INTRAVENOUS
  Filled 2013-03-08 (×4): qty 100

## 2013-03-08 MED ORDER — VANCOMYCIN HCL 1000 MG IV SOLR: INTRAVENOUS | Status: DC | PRN

## 2013-03-08 MED ORDER — METOCLOPRAMIDE HCL 10 MG PO TABS: 5.0000 mg | ORAL_TABLET | Freq: Three times a day (TID) | ORAL | Status: DC | PRN

## 2013-03-08 MED ORDER — HYDROMORPHONE HCL PF 1 MG/ML IJ SOLN
1.0000 mg | INTRAMUSCULAR | Status: DC | PRN
  Administered 2013-03-08 – 2013-03-10 (×18): 1 mg via INTRAVENOUS
  Filled 2013-03-08 (×20): qty 1

## 2013-03-08 MED ORDER — ONDANSETRON HCL 4 MG PO TABS: 4.0000 mg | ORAL_TABLET | Freq: Four times a day (QID) | ORAL | Status: DC | PRN

## 2013-03-08 MED ORDER — ONDANSETRON HCL 4 MG/2ML IJ SOLN
INTRAMUSCULAR | Status: DC | PRN
  Administered 2013-03-08: 4 mg via INTRAVENOUS

## 2013-03-08 MED ORDER — ENOXAPARIN SODIUM 40 MG/0.4ML ~~LOC~~ SOLN
40.0000 mg | SUBCUTANEOUS | Status: DC
  Administered 2013-03-09 – 2013-03-11 (×3): 40 mg via SUBCUTANEOUS
  Filled 2013-03-08 (×3): qty 0.4

## 2013-03-08 MED ORDER — MIDAZOLAM HCL 2 MG/2ML IJ SOLN
INTRAMUSCULAR | Status: AC
  Filled 2013-03-08: qty 2

## 2013-03-08 MED ORDER — BUPIVACAINE-EPINEPHRINE PF 0.25-1:200000 % IJ SOLN
INTRAMUSCULAR | Status: DC | PRN
  Administered 2013-03-08: 30 mL

## 2013-03-08 MED ORDER — MINERAL OIL LIGHT 100 % EX OIL
TOPICAL_OIL | CUTANEOUS | Status: AC
  Filled 2013-03-08: qty 25

## 2013-03-08 MED ORDER — DEXTROSE 5 % IV SOLN: INTRAVENOUS | Status: DC | PRN

## 2013-03-08 MED ORDER — OXYCODONE HCL 5 MG/5ML PO SOLN: 5.0000 mg | Freq: Once | ORAL | Status: DC | PRN

## 2013-03-08 MED ORDER — METHOCARBAMOL 500 MG PO TABS
1000.0000 mg | ORAL_TABLET | Freq: Four times a day (QID) | ORAL | Status: DC
  Administered 2013-03-08: 1000 mg via ORAL
  Filled 2013-03-08: qty 2

## 2013-03-08 MED ORDER — LIDOCAINE HCL 4 % MT SOLN
OROMUCOSAL | Status: DC | PRN
  Administered 2013-03-08: 4 mL via TOPICAL

## 2013-03-08 MED ORDER — MINERAL OIL LIGHT 100 % EX OIL
TOPICAL_OIL | CUTANEOUS | Status: DC | PRN
  Administered 2013-03-08: 1 via TOPICAL

## 2013-03-08 MED ORDER — SODIUM CHLORIDE 0.9 % IJ SOLN
INTRAMUSCULAR | Status: AC
  Filled 2013-03-08: qty 3

## 2013-03-08 MED ORDER — VANCOMYCIN HCL IN DEXTROSE 1-5 GM/200ML-% IV SOLN
INTRAVENOUS | Status: AC
  Administered 2013-03-08: 1000 mg via INTRAVENOUS
  Filled 2013-03-08: qty 200

## 2013-03-08 MED ORDER — MEPERIDINE HCL 25 MG/ML IJ SOLN: 6.2500 mg | INTRAMUSCULAR | Status: DC | PRN

## 2013-03-08 MED ORDER — POTASSIUM CHLORIDE IN NACL 20-0.9 MEQ/L-% IV SOLN
INTRAVENOUS | Status: DC
  Administered 2013-03-08 – 2013-03-10 (×2): via INTRAVENOUS
  Filled 2013-03-08 (×4): qty 1000

## 2013-03-08 MED ORDER — LIDOCAINE HCL (CARDIAC) 20 MG/ML IV SOLN
INTRAVENOUS | Status: DC | PRN
  Administered 2013-03-08: 80 mg via INTRAVENOUS

## 2013-03-08 SURGICAL SUPPLY — 87 items
13 hole Proximal Tibia plate -STD- Left ×3 IMPLANT
BANDAGE ELASTIC 4 VELCRO ST LF (GAUZE/BANDAGES/DRESSINGS) ×3 IMPLANT
BANDAGE ELASTIC 6 VELCRO ST LF (GAUZE/BANDAGES/DRESSINGS) ×3 IMPLANT
BANDAGE GAUZE ELAST BULKY 4 IN (GAUZE/BANDAGES/DRESSINGS) ×6 IMPLANT
BIT DRILL 100X2.5XANTM LCK (BIT) ×2 IMPLANT
BIT DRILL CAL (BIT) ×2 IMPLANT
BIT DRL 100X2.5XANTM LCK (BIT) ×2
BLADE DERMATOME SS (BLADE) ×3 IMPLANT
BLADE SURG ROTATE 9660 (MISCELLANEOUS) IMPLANT
BNDG COHESIVE 4X5 TAN STRL (GAUZE/BANDAGES/DRESSINGS) ×3 IMPLANT
BNDG ELASTIC 6X10 VLCR STRL LF (GAUZE/BANDAGES/DRESSINGS) ×3 IMPLANT
BONE CEMENT PALACOSE (Orthopedic Implant) ×3 IMPLANT
BRUSH SCRUB DISP (MISCELLANEOUS) ×6 IMPLANT
CANISTER SUCTION 2500CC (MISCELLANEOUS) IMPLANT
CEMENT BONE PALACOSE (Orthopedic Implant) ×2 IMPLANT
CLOTH BEACON ORANGE TIMEOUT ST (SAFETY) ×3 IMPLANT
CLSR STERI-STRIP ANTIMIC 1/2X4 (GAUZE/BANDAGES/DRESSINGS) ×3 IMPLANT
COTTONBALL LRG STERILE PKG (GAUZE/BANDAGES/DRESSINGS) ×6 IMPLANT
COVER SURGICAL LIGHT HANDLE (MISCELLANEOUS) ×6 IMPLANT
DEPRESSOR TONGUE BLADE STERILE (MISCELLANEOUS) ×6 IMPLANT
DERMACARRIERS GRAFT 1 TO 1.5 (DISPOSABLE) ×3
DRAPE C-ARM 42X72 X-RAY (DRAPES) ×3 IMPLANT
DRAPE C-ARMOR (DRAPES) ×3 IMPLANT
DRAPE EXTREMITY T 121X128X90 (DRAPE) IMPLANT
DRAPE PROXIMA HALF (DRAPES) ×6 IMPLANT
DRILL BIT 2.5MM (BIT) ×1
DRILL BIT CAL (BIT) ×3
DRSG ADAPTIC 3X8 NADH LF (GAUZE/BANDAGES/DRESSINGS) ×6 IMPLANT
DRSG MEPILEX BORDER 4X12 (GAUZE/BANDAGES/DRESSINGS) ×3 IMPLANT
DRSG MEPITEL 4X7.2 (GAUZE/BANDAGES/DRESSINGS) ×6 IMPLANT
DRSG PAD ABDOMINAL 8X10 ST (GAUZE/BANDAGES/DRESSINGS) ×3 IMPLANT
DRSG VAC ATS LRG SENSATRAC (GAUZE/BANDAGES/DRESSINGS) ×3 IMPLANT
ELECT REM PT RETURN 9FT ADLT (ELECTROSURGICAL)
ELECTRODE REM PT RTRN 9FT ADLT (ELECTROSURGICAL) IMPLANT
GAUZE SPONGE 4X4 16PLY XRAY LF (GAUZE/BANDAGES/DRESSINGS) ×3 IMPLANT
GAUZE XEROFORM 5X9 LF (GAUZE/BANDAGES/DRESSINGS) ×6 IMPLANT
GLOVE BIO SURGEON STRL SZ7.5 (GLOVE) ×3 IMPLANT
GLOVE BIO SURGEON STRL SZ8 (GLOVE) ×9 IMPLANT
GLOVE BIOGEL PI IND STRL 7.5 (GLOVE) ×4 IMPLANT
GLOVE BIOGEL PI IND STRL 8 (GLOVE) ×2 IMPLANT
GLOVE BIOGEL PI INDICATOR 7.5 (GLOVE) ×2
GLOVE BIOGEL PI INDICATOR 8 (GLOVE) ×1
GOWN PREVENTION PLUS XLARGE (GOWN DISPOSABLE) ×3 IMPLANT
GOWN PREVENTION PLUS XXLARGE (GOWN DISPOSABLE) IMPLANT
GOWN STRL NON-REIN LRG LVL3 (GOWN DISPOSABLE) ×6 IMPLANT
GRAFT DERMACARRIERS 1 TO 1.5 (DISPOSABLE) ×2 IMPLANT
HANDPIECE INTERPULSE COAX TIP (DISPOSABLE)
K-WIRE ACE 1.6X6 (WIRE) ×15
KIT BASIN OR (CUSTOM PROCEDURE TRAY) ×3 IMPLANT
KIT DRAINAGE VACCUM ASSIST (KITS) ×3 IMPLANT
KIT ROOM TURNOVER OR (KITS) ×3 IMPLANT
KWIRE ACE 1.6X6 (WIRE) ×10 IMPLANT
MANIFOLD NEPTUNE II (INSTRUMENTS) IMPLANT
NS IRRIG 1000ML POUR BTL (IV SOLUTION) ×3 IMPLANT
PACK GENERAL/GYN (CUSTOM PROCEDURE TRAY) ×3 IMPLANT
PAD ARMBOARD 7.5X6 YLW CONV (MISCELLANEOUS) ×6 IMPLANT
PAD CAST 4YDX4 CTTN HI CHSV (CAST SUPPLIES) ×2 IMPLANT
PADDING CAST COTTON 4X4 STRL (CAST SUPPLIES) ×1
PADDING CAST COTTON 6X4 STRL (CAST SUPPLIES) ×3 IMPLANT
PENCIL BUTTON HOLSTER BLD 10FT (ELECTRODE) ×3 IMPLANT
SCREW CORTICAL 3.5MM  28MM (Screw) ×3 IMPLANT
SCREW CORTICAL 3.5MM 28MM (Screw) ×6 IMPLANT
SCREW CORTICAL 3.5MM 38MM (Screw) ×3 IMPLANT
SCREW LOCK CORT STAR 3.5X70 (Screw) ×3 IMPLANT
SCREW LOCK CORT STAR 3.5X75 (Screw) ×3 IMPLANT
SCREW LOCK CORT STAR 3.5X80 (Screw) ×9 IMPLANT
SCREW LOCK CORT STAR 3.5X85 (Screw) ×3 IMPLANT
SCREW LOW PROF CORTICAL 3.5X80 (Screw) ×3 IMPLANT
SET HNDPC FAN SPRY TIP SCT (DISPOSABLE) IMPLANT
SPONGE GAUZE 4X4 12PLY (GAUZE/BANDAGES/DRESSINGS) ×3 IMPLANT
SPONGE LAP 18X18 X RAY DECT (DISPOSABLE) ×9 IMPLANT
SPONGE SCRUB IODOPHOR (GAUZE/BANDAGES/DRESSINGS) ×3 IMPLANT
STAPLER VISISTAT (STAPLE) ×3 IMPLANT
STAPLER VISISTAT 35W (STAPLE) ×6 IMPLANT
STOCKINETTE IMPERVIOUS LG (DRAPES) ×3 IMPLANT
SUCTION FRAZIER TIP 10 FR DISP (SUCTIONS) ×3 IMPLANT
SUT 5.0 PDS RB-1 (SUTURE) ×1
SUT ETHILON 3 0 FSL (SUTURE) ×3 IMPLANT
SUT ETHILON 3 0 PS 1 (SUTURE) ×3 IMPLANT
SUT PDS AB 2-0 CT1 27 (SUTURE) ×6 IMPLANT
SUT PDS PLUS AB 5-0 RB-1 (SUTURE) ×2 IMPLANT
TOWEL OR 17X24 6PK STRL BLUE (TOWEL DISPOSABLE) ×3 IMPLANT
TOWEL OR 17X26 10 PK STRL BLUE (TOWEL DISPOSABLE) ×6 IMPLANT
TUBE CONNECTING 12X1/4 (SUCTIONS) ×3 IMPLANT
UNDERPAD 30X30 INCONTINENT (UNDERPADS AND DIAPERS) ×6 IMPLANT
WATER STERILE IRR 1000ML POUR (IV SOLUTION) ×3 IMPLANT
YANKAUER SUCT BULB TIP NO VENT (SUCTIONS) ×6 IMPLANT

## 2013-03-08 NOTE — Anesthesia Preprocedure Evaluation (Addendum)
Anesthesia Evaluation  Patient identified by MRN, date of birth, ID band Patient awake    Reviewed: Allergy & Precautions, H&P , NPO status , Patient's Chart, lab work & pertinent test results  History of Anesthesia Complications Negative for: history of anesthetic complications  Airway Mallampati: I TM Distance: >3 FB Neck ROM: full    Dental  (+) Dental Advisory Given, Poor Dentition and Edentulous Upper   Pulmonary Current Smoker,  breath sounds clear to auscultation        Cardiovascular + Peripheral Vascular Disease Rhythm:Regular Rate:Normal     Neuro/Psych negative neurological ROS  negative psych ROS   GI/Hepatic GERD-  Medicated and Controlled,(+)     substance abuse  marijuana use,   Endo/Other  negative endocrine ROS  Renal/GU      Musculoskeletal negative musculoskeletal ROS (+)   Abdominal   Peds  Hematology negative hematology ROS (+)   Anesthesia Other Findings   Reproductive/Obstetrics negative OB ROS                         Anesthesia Physical Anesthesia Plan  ASA: II  Anesthesia Plan: General ETT   Post-op Pain Management:    Induction: Intravenous  Airway Management Planned: Oral ETT  Additional Equipment:   Intra-op Plan:   Post-operative Plan: Extubation in OR  Informed Consent: I have reviewed the patients History and Physical, chart, labs and discussed the procedure including the risks, benefits and alternatives for the proposed anesthesia with the patient or authorized representative who has indicated his/her understanding and acceptance.   Dental advisory given  Plan Discussed with: CRNA and Surgeon  Anesthesia Plan Comments:        Anesthesia Quick Evaluation

## 2013-03-08 NOTE — Preoperative (Signed)
Beta Blockers   Reason not to administer Beta Blockers:Not Applicable 

## 2013-03-08 NOTE — Progress Notes (Signed)
Utilization review complete. Ether Goebel RN CCM Case Mgmt phone [phone] 

## 2013-03-08 NOTE — Anesthesia Postprocedure Evaluation (Signed)
  Anesthesia Post-op Note  Patient: [name]  Procedure(s) Performed: Procedure(s) with comments: LEFT LEG SPLIT THICKNESS SKIN GRAFT  (Right) OPEN REDUCTION INTERNAL FIXATION (ORIF) TIBIAL PLATEAU (Left) - Placement of cement spacer REMOVAL EXTERNAL FIXATION LEG (Left) APPLICATION OF WOUND VAC (Left) - Wound VAC Exchange  Patient Location: PACU  Anesthesia Type:General  Level of Consciousness: awake, pateint uncooperative and confused  Airway and Oxygen Therapy: Patient Spontanous Breathing  Post-op Pain: mild  Post-op Assessment: Post-op Vital signs reviewed  Post-op Vital Signs: stable  Complications: No apparent anesthesia complications

## 2013-03-08 NOTE — Brief Op Note (Signed)
02/26/2013 - 03/08/2013  6:06 PM  PATIENT:  [name]  37 y.o. male  PRE-OPERATIVE DIAGNOSIS:  OPEN LEFT TIBIAL FRACTURE with segmental bone defect  POST-OPERATIVE DIAGNOSIS:  OPEN LEFT TIBIAL FRACTURE with segmental bone defect   PROCEDURE:  Procedure(s) with comments: 1. LEFT LEG SPLIT THICKNESS SKIN GRAFT  (Right) 10 x 24cm 2. OPEN REDUCTION INTERNAL FIXATION (ORIF) TIBIAL PLATEAU (Left) - Placement of cement spacer 3. ORIF tibial shaft 4. REMOVAL EXTERNAL FIXATION LEG (Left) 5. APPLICATION OF WOUND VAC (Left) - Wound VAC Exchange 6. Placement of antibiotic spacer  SURGEON:  Surgeon(s) and Role:    * Budd Palmer, MD - Primary  PHYSICIAN ASSISTANT: Montez Morita, [facility]  ANESTHESIA:   general  EBL:  Total I/O In: 2200 [I.V.:1950; IV Piggyback:250] Out: 825 [Urine:700; Blood:125]  BLOOD ADMINISTERED:none  DRAINS: wound vac   LOCAL MEDICATIONS USED:  MARCAINE  SPECIMEN:  No Specimen  DISPOSITION OF SPECIMEN:  N/A  COUNTS:  YES  TOURNIQUET:    DICTATION: .Note written in paper chart and Other Dictation: Dictation Number [id number]  PLAN OF CARE: Admit to inpatient   PATIENT DISPOSITION:  PACU - hemodynamically stable.   Delay start of Pharmacological VTE agent (>24hrs) due to surgical blood loss or risk of bleeding: no

## 2013-03-08 NOTE — OR Nursing (Signed)
External fixator components discarded per Dr. Carola Frost

## 2013-03-08 NOTE — Transfer of Care (Signed)
Immediate Anesthesia Transfer of Care Note  Patient: [name]  Procedure(s) Performed: Procedure(s) with comments: LEFT LEG SPLIT THICKNESS SKIN GRAFT  (Right) OPEN REDUCTION INTERNAL FIXATION (ORIF) TIBIAL PLATEAU (Left) - Placement of cement spacer REMOVAL EXTERNAL FIXATION LEG (Left) APPLICATION OF WOUND VAC (Left) - Wound VAC Exchange  Patient Location: PACU  Anesthesia Type:General  Level of Consciousness: awake, alert  and pateint uncooperative  Airway & Oxygen Therapy: Patient Spontanous Breathing  Post-op Assessment: Report given to PACU RN, Post -op Vital signs reviewed and stable and Patient moving all extremities  Post vital signs: Reviewed and stable  Complications: No apparent anesthesia complications

## 2013-03-08 NOTE — Anesthesia Procedure Notes (Signed)
Procedure Name: Intubation Date/Time: 03/08/2013 1:44 PM Performed by: Brien Mates DOBSON Pre-anesthesia Checklist: Patient identified, Emergency Drugs available, Suction available, Patient being monitored and Timeout performed Patient Re-evaluated:Patient Re-evaluated prior to inductionOxygen Delivery Method: Circle system utilized Preoxygenation: Pre-oxygenation with 100% oxygen Intubation Type: IV induction Laryngoscope Size: Miller and 2 Grade View: Grade I Tube type: Oral Tube size: 7.5 mm Number of attempts: 1 Airway Equipment and Method: Stylet Placement Confirmation: ETT inserted through vocal cords under direct vision,  positive ETCO2 and breath sounds checked- equal and bilateral Secured at: 22 cm Tube secured with: Tape Dental Injury: Teeth and Oropharynx as per pre-operative assessment

## 2013-03-08 NOTE — Progress Notes (Signed)
Physical Therapy Treatment Patient Details Name: Astin Sayre MRN: [id number] DOB: [birth year] Today's Date: 03/08/2013 Time: 7829-5621 PT Time Calculation (min): 13 min  PT Assessment / Plan / Recommendation Comments on Treatment Session  Pt agreeable to supine ther ex.  Pt scheduled for OR today and is NPO and pt reports he doesn't have the energy to get up to the chair.    Follow Up Recommendations  Supervision/Assistance - 24 hour;Other (comment)     Does the patient have the potential to tolerate intense rehabilitation     Barriers to Discharge        Equipment Recommendations  Rolling walker with 5" wheels;Wheelchair (measurements PT);Wheelchair cushion (measurements PT)    Recommendations for Other Services    Frequency Min 5X/week   Plan Discharge plan remains appropriate;Frequency remains appropriate    Precautions / Restrictions Restrictions Weight Bearing Restrictions: Yes LLE Weight Bearing: Non weight bearing   Pertinent Vitals/Pain General soreness    Mobility       Exercises General Exercises - Lower Extremity Ankle Circles/Pumps: AROM;AAROM;Both (gentle heel cord stretching on L) Hip ABduction/ADduction: AROM;Both;5 reps;10 reps;Strengthening Straight Leg Raises: AROM;Right;10 reps;Strengthening   PT Diagnosis:    PT Problem List:   PT Treatment Interventions:     PT Goals    Visit Information  Assistance Needed: +1    Subjective Data  Subjective: "I would get up if it wasn't for not being able to eat or drink anything."  Pt scheduled for OR this afternoon.   Cognition       Balance     End of Session PT - End of Session Activity Tolerance: Patient tolerated treatment well Patient left: in bed   GP     Anasophia Pecor LUBECK 03/08/2013, 9:58 AM

## 2013-03-08 NOTE — Progress Notes (Signed)
Pt came to PACU extremely combative and agitated. After much reorientation and reassurances that he was safe pt calm and cooperative now remains upset over the surgery and pain in leg. Pt offered additional pain meds but pt wanted to return to his room asap he wanted to see family

## 2013-03-09 ENCOUNTER — Inpatient Hospital Stay (HOSPITAL_COMMUNITY): Payer: Medicare Other

## 2013-03-09 LAB — COMPREHENSIVE METABOLIC PANEL
ALT: 12 U/L (ref 0–53)
AST: 19 U/L (ref 0–37)
Alkaline Phosphatase: 61 U/L (ref 39–117)
CO2: 25 mEq/L (ref 19–32)
Calcium: 8.3 mg/dL — ABNORMAL LOW (ref 8.4–10.5)
Potassium: 4.4 mEq/L (ref 3.5–5.1)
Sodium: 137 mEq/L (ref 135–145)
Total Protein: 6.1 g/dL (ref 6.0–8.3)

## 2013-03-09 LAB — CBC
Hemoglobin: 9.1 g/dL — ABNORMAL LOW (ref 13.0–17.0)
MCH: 30 pg (ref 26.0–34.0)
Platelets: 500 10*3/uL — ABNORMAL HIGH (ref 150–400)
RBC: 3.03 MIL/uL — ABNORMAL LOW (ref 4.22–5.81)

## 2013-03-09 MED ORDER — ACETAMINOPHEN 500 MG PO TABS
1000.0000 mg | ORAL_TABLET | Freq: Four times a day (QID) | ORAL | Status: DC
  Administered 2013-03-09 – 2013-03-10 (×4): 1000 mg via ORAL
  Filled 2013-03-09 (×8): qty 2
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
## 2013-03-09 NOTE — Addendum Note (Signed)
Addendum created 03/09/13 1147 by Edmonia Caprio, CRNA   Modules edited: Anesthesia Medication Administration

## 2013-03-09 NOTE — Progress Notes (Addendum)
Physical Therapy Treatment Patient Details Name: [name] MRN: [id number] DOB: Jan 10, 1976 Today's Date: 03/09/2013 Time: 0940-1005 PT Time Calculation (min): 25 min  PT Assessment / Plan / Recommendation Comments on Treatment Session  Pt underwent skin graft and removal of external fixator and ORIF of bicondylar tibial plateau and tibial shaft yesterday.  Goals set at eval are still appropriate.  Pt is very impulsive and is easily distracted by his IV and wound vac line.  Need frequent re-focusing for safety.  Pt reports he is more pain than before and was unable to ambulate today.    Follow Up Recommendations  Supervision/Assistance - 24 hour;Other (comment)     Does the patient have the potential to tolerate intense rehabilitation     Barriers to Discharge        Equipment Recommendations  Rolling walker with 5" wheels;Wheelchair (measurements PT);Wheelchair cushion (measurements PT)    Recommendations for Other Services    Frequency Min 5X/week   Plan Discharge plan remains appropriate;Frequency remains appropriate    Precautions / Restrictions Restrictions LLE Weight Bearing: Non weight bearing   Pertinent Vitals/Pain 8/10 L leg    Mobility  Bed Mobility Bed Mobility: Supine to Sit Supine to Sit: 6: Modified independent (Device/Increase time);HOB elevated;With rails Sitting - Scoot to Edge of Bed: 4: Min assist Details for Bed Mobility Assistance: MIN A to support L LE during scooting to EOB Transfers Transfers: Sit to Stand Sit to Stand: 4: Min guard;5: Supervision Stand to Sit: 5: Supervision Stand Pivot Transfers: 4: Min assist Details for Transfer Assistance: pt very impulsive and distracted by lines.  Cues for safety Ambulation/Gait General Gait Details: declined ambulation due to pain    Exercises     PT Diagnosis:    PT Problem List:   PT Treatment Interventions:     PT Goals Acute Rehab PT Goals PT Goal Formulation: With patient Time For Goal  Achievement: 03/16/13 Pt will go Supine/Side to Sit: with HOB 0 degrees PT Goal: Supine/Side to Sit - Progress: Progressing toward goal PT Goal: Sit to Stand - Progress: Progressing toward goal PT Goal: Stand to Sit - Progress: Progressing toward goal  Visit Information  Last PT Received On: 03/09/13 Assistance Needed: +2 (2 would be helpful for lines and safety)    Subjective Data  Subjective: Pt on phone, "This dag-dum physical therapist is here to make me get up."  "It hurts more now that they took that fixator off" Patient Stated Goal: decrease pain   Cognition  Cognition Arousal/Alertness: Awake/alert Behavior During Therapy: Impulsive Overall Cognitive Status: Within Functional Limits for tasks assessed    Balance     End of Session     GP     Tirrell Buchberger LUBECK 03/09/2013, 10:14 AM

## 2013-03-09 NOTE — Progress Notes (Signed)
[facility]   Progress Note Bypass Surgery     History of Present Illness  [name] is a 37 y.o. male who is S/P Left distal superficial femoral artery to posterior tibial artery bypass graft with contralateral reversed greater saphenous vein. #2: 4 compartment fasciotomy  The patient has some sensation in left foot and has some dorsiflexion in that foot as well. He states he has had 3 back surgeries and already had decreased sensation and motion in the left foot prior to this injury  Significant Diagnostic Studies: CBC Lab Results  Component Value Date   WBC 12.2* 03/09/2013   HGB 9.1* 03/09/2013   HCT 28.1* 03/09/2013   MCV 92.7 03/09/2013   PLT 500* 03/09/2013    BMET     Component Value Date/Time   NA 137 03/09/2013 0500   K 4.4 03/09/2013 0500   CL 103 03/09/2013 0500   CO2 25 03/09/2013 0500   GLUCOSE 117* 03/09/2013 0500   BUN 7 03/09/2013 0500   CREATININE 1.11 03/09/2013 0500   CALCIUM 8.3* 03/09/2013 0500   GFRNONAA 83* 03/09/2013 0500   GFRAA >90 03/09/2013 0500    COAG Lab Results  Component Value Date   INR 0.93 02/26/2013   No results found for this basename: PTT    Physical Examination  BP Readings from Last 3 Encounters:  03/09/13 154/82  03/09/13 154/82  03/09/13 154/82   Temp Readings from Last 3 Encounters:  03/09/13 97.6 F (36.4 C)   03/09/13 97.6 F (36.4 C)   03/09/13 97.6 F (36.4 C)    SpO2 Readings from Last 3 Encounters:  03/09/13 99%  03/09/13 99%  03/09/13 99%   Pulse Readings from Last 3 Encounters:  03/09/13 104  03/09/13 104  03/09/13 104    Pt is A&O x 3 right lower, left lower extremity: Incision/s is/are clean,dry.intact, and  healing without hematoma, erythema or drainage Limb is warm; with good color  Left Posterior tibial pulse is  palpable Positive motion and stable sensation left foot  Assessment/Plan: Pt. Doing well 10 days post Left distal superficial femoral artery to posterior tibial artery  bypass graft with contralateral reversed greater saphenous vein. and 4 compartment fasciotomy Post-op pain is controlled Wounds are healing well Sensation and motion stable - had deficits from previous back surgeries prior to this injury PT/OT for ambulation Continue wound care as ordered  Sou Nohr J  03/09/2013 8:37 AM

## 2013-03-09 NOTE — Progress Notes (Signed)
Patient had a temp of 103.2 at 1400. Notified Montez Morita, PA at [phone]. Patient received extra strength tylenol 1,000mg  at 1418 and a dose of vancomycin was given. Will continue to monitor

## 2013-03-09 NOTE — Consult Note (Signed)
I have seen and examined the patient. I agree with the findings above.  I also discussed this patient at length with Dr. Rennis Chris and will accept transfer of care per his request.  Budd Palmer, MD 03/09/2013 11:25 PM
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
## 2013-03-09 NOTE — Progress Notes (Signed)
Orthopedic Tech Progress Note Patient Details:  [name] 10/07/1975 [id number] Applied overhead frame     Jennye Moccasin 03/09/2013, 5:21 PM

## 2013-03-10 ENCOUNTER — Encounter (HOSPITAL_COMMUNITY): Payer: Self-pay | Admitting: Orthopedic Surgery

## 2013-03-10 LAB — COMPREHENSIVE METABOLIC PANEL
ALT: 9 U/L (ref 0–53)
AST: 14 U/L (ref 0–37)
Alkaline Phosphatase: 60 U/L (ref 39–117)
CO2: 24 mEq/L (ref 19–32)
Calcium: 7.9 mg/dL — ABNORMAL LOW (ref 8.4–10.5)
Chloride: 103 mEq/L (ref 96–112)
GFR calc Af Amer: >90 mL/min (ref >90)
GFR calc non Af Amer: >90 mL/min (ref >90)
Glucose, Bld: 109 mg/dL — ABNORMAL HIGH (ref 70–99)
Potassium: 4 mEq/L (ref 3.5–5.1)
Sodium: 136 mEq/L (ref 135–145)
Total Bilirubin: 0.3 mg/dL (ref 0.3–1.2)

## 2013-03-10 LAB — CBC WITH DIFFERENTIAL/PLATELET
Basophils Absolute: 0 10*3/uL (ref 0.0–0.1)
Basophils Relative: 0 % (ref 0–1)
Eosinophils Absolute: 0.4 10*3/uL (ref 0.0–0.7)
Hemoglobin: 8.8 g/dL — ABNORMAL LOW (ref 13.0–17.0)
MCH: 30.1 pg (ref 26.0–34.0)
MCHC: 32.5 g/dL (ref 30.0–36.0)
Monocytes Relative: 12 % (ref 3–12)
Neutro Abs: 8.3 10*3/uL — ABNORMAL HIGH (ref 1.7–7.7)
Neutrophils Relative %: 67 % (ref 43–77)
Platelets: 498 10*3/uL — ABNORMAL HIGH (ref 150–400)
RDW: 15.5 % (ref 11.5–15.5)

## 2013-03-10 MED ORDER — HYDROMORPHONE HCL PF 1 MG/ML IJ SOLN
1.0000 mg | Freq: Four times a day (QID) | INTRAMUSCULAR | Status: DC | PRN
  Administered 2013-03-10 – 2013-03-11 (×5): 1 mg via INTRAVENOUS
  Filled 2013-03-10 (×4): qty 1

## 2013-03-10 MED ORDER — OXYCODONE HCL 5 MG PO TABS
5.0000 mg | ORAL_TABLET | ORAL | Status: DC | PRN
  Administered 2013-03-10 – 2013-03-11 (×4): 15 mg via ORAL
  Filled 2013-03-10 (×4): qty 3

## 2013-03-10 MED ORDER — METHOCARBAMOL 500 MG PO TABS
1000.0000 mg | ORAL_TABLET | Freq: Four times a day (QID) | ORAL | Status: DC
  Administered 2013-03-10 – 2013-03-11 (×6): 1000 mg via ORAL
  Filled 2013-03-10 (×7): qty 2

## 2013-03-10 MED ORDER — TRAMADOL HCL 50 MG PO TABS: 100.0000 mg | ORAL_TABLET | Freq: Two times a day (BID) | ORAL | Status: DC | PRN

## 2013-03-10 MED ORDER — OXYCODONE-ACETAMINOPHEN 5-325 MG PO TABS
1.0000 | ORAL_TABLET | Freq: Four times a day (QID) | ORAL | Status: DC | PRN
  Administered 2013-03-10: 1 via ORAL
  Filled 2013-03-10: qty 2

## 2013-03-10 MED ORDER — OXYCODONE HCL 5 MG PO TABS
5.0000 mg | ORAL_TABLET | Freq: Four times a day (QID) | ORAL | Status: DC | PRN
  Administered 2013-03-10 – 2013-03-11 (×3): 10 mg via ORAL
  Filled 2013-03-10 (×3): qty 2

## 2013-03-10 NOTE — Progress Notes (Signed)
Pts wound vac has an output of from time of placement until 1853 today.
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
## 2013-03-10 NOTE — Progress Notes (Addendum)
Physical Therapy Treatment Patient Details Name: [name] MRN: [id number] DOB: Nov 26, 1975 Today's Date: 03/10/2013 Time: 4098-1191 PT Time Calculation (min): 15 min  PT Assessment / Plan / Recommendation Comments on Treatment Session  Discussed exercise status for pt. with Montez Morita PA.  He wants pt. to exercise left ankle and knee as tolerated.  DF is -25 degrees and knee flexion is 45 degrees ( both active assistive).  Pt. much more pleasant this pm as his behavior and lashing out at staff was addressed by Montez Morita PA.  Additional goal added today for ther ex at independent level.  Pt. now in PRAFO L lower leg    Follow Up Recommendations  Supervision/Assistance - 24 hour     Does the patient have the potential to tolerate intense rehabilitation     Barriers to Discharge        Equipment Recommendations  Rolling walker with 5" wheels;Wheelchair (measurements PT);Wheelchair cushion (measurements PT)    Recommendations for Other Services    Frequency Min 5X/week   Plan Discharge plan remains appropriate;Frequency remains appropriate    Precautions / Restrictions Precautions Precautions: Fall Restrictions Weight Bearing Restrictions: Yes LLE Weight Bearing: Non weight bearing   Pertinent Vitals/Pain 9/10 pain left leg, RN in with pain med          Exercises Total Joint Exercises Knee Flexion: AAROM;Left;10 reps;Supine General Exercises - Lower Extremity Ankle Circles/Pumps: Left;AROM;AAROM;15 reps;Supine   PT Diagnosis:    PT Problem List:   PT Treatment Interventions:     PT Goals Acute Rehab PT Goals PT Goal Formulation: With patient Time For Goal Achievement: 03/16/13 Potential to Achieve Goals: Good l Pt will Perform Home Exercise Program: Independently PT Goal: Perform Home Exercise Program - Progress: Goal set today  Visit Information  Last PT Received On: 03/10/13 Assistance Needed: +1 (for bed exercises)    Subjective Data  Subjective: Pt agreeable  to exercise of Left LE   Cognition  Cognition Arousal/Alertness: Awake/alert Behavior During Therapy: WFL for tasks assessed/performed Overall Cognitive Status: Within Functional Limits for tasks assessed       End of Session PT - End of Session Activity Tolerance: Patient tolerated treatment well Patient left: in bed Nurse Communication: Patient requests pain meds   GP     Ferman Hamming 03/10/2013, 1:08 PM [place] Picking PT Acute Rehab Services [phone] [place] [phone]
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
## 2013-03-10 NOTE — Progress Notes (Signed)
Physical Therapy Treatment Patient Details Name: [name] MRN: [id number] DOB: 1976/06/07 Today's Date: 03/10/2013 Time: 4098-1191 PT Time Calculation (min): 32 min  PT Assessment / Plan / Recommendation Comments on Treatment Session  Pt. wanted to go into the bathroom and shave.  He tolerated standing at sink NWB L LE  while shaving at least 3 minutes.  Is overall making progress but is impulsive and insistent on doing things his way which is a safety concern .  Do believe he will be compliant with NWB status once DC'd however.    Follow Up Recommendations  Supervision/Assistance - 24 hour     Does the patient have the potential to tolerate intense rehabilitation     Barriers to Discharge        Equipment Recommendations  Rolling walker with 5" wheels;Wheelchair (measurements PT);Wheelchair cushion (measurements PT)    Recommendations for Other Services    Frequency Min 5X/week   Plan Discharge plan remains appropriate;Frequency remains appropriate    Precautions / Restrictions Precautions Precautions: Fall Precaution Comments: external fixator removed, has wound vac on L LE Restrictions Weight Bearing Restrictions: Yes LLE Weight Bearing: Non weight bearing   Pertinent Vitals/Pain See vitals tab     Mobility  Bed Mobility Bed Mobility: Not assessed (pt. seated at edge of bed with OT upon PT entry) Supine to Sit: 6: Modified independent (Device/Increase time);HOB elevated;With rails Sitting - Scoot to Edge of Bed: 6: Modified independent (Device/Increase time) Details for Bed Mobility Assistance: HOB slightly elevated Transfers Transfers: Sit to Stand Sit to Stand: 4: Min guard;With upper extremity assist;From bed;From chair/3-in-1 Stand to Sit: 5: Supervision;With upper extremity assist;To chair/3-in-1 Details for Transfer Assistance: verbal safety cues due to his impulsivity  Ambulation/Gait Ambulation/Gait Assistance: 4: Min guard Ambulation Distance (Feet): 15  Feet Assistive device: Rolling walker Ambulation/Gait Assistance Details: Pt. able to comply with NWB L LE status.  Needs safety cueing for impulsive tendancies Gait Pattern: Step-to pattern (single leg hop due to NWB status) Gait velocity: decreased due to pain     Exercises     PT Diagnosis:    PT Problem List:   PT Treatment Interventions:     PT Goals Acute Rehab PT Goals PT Goal Formulation: With patient Time For Goal Achievement: 03/16/13 Potential to Achieve Goals: Good Pt will go Sit to Stand: with modified independence PT Goal: Sit to Stand - Progress: Progressing toward goal Pt will go Stand to Sit: with modified independence PT Goal: Stand to Sit - Progress: Progressing toward goal Pt will Ambulate: 51 - 150 feet;with modified independence;with least restrictive assistive device PT Goal: Ambulate - Progress: Progressing toward goal  Visit Information  Last PT Received On: 03/10/13 Assistance Needed: +2    Subjective Data  Subjective: Pt. requesting to go into bathroom to shave his face   Cognition  Cognition Arousal/Alertness: Awake/alert Behavior During Therapy: Impulsive Overall Cognitive Status: Within Functional Limits for tasks assessed    Balance  Balance Balance Assessed: Yes Static Standing Balance Static Standing - Balance Support: Left upper extremity supported;During functional activity Static Standing - Level of Assistance: 5: Stand by assistance;Other (comment) (min guard for safety) Dynamic Standing Balance Dynamic Standing - Balance Support: Bilateral upper extremity supported;During functional activity Dynamic Standing - Level of Assistance: 5: Stand by assistance  End of Session     GP     Ferman Hamming 03/10/2013, 12:00 PM Weldon Picking PT Acute Rehab Services [phone] Beeper [phone]

## 2013-03-10 NOTE — Progress Notes (Signed)
ANTIBIOTIC CONSULT NOTE - FOLLOW UP  Pharmacy Consult for Vancomycin Indication: open tibia fracture with external fixation  Allergies  Allergen Reactions  . Flexeril (Cyclobenzaprine) Other (See Comments)    Makes him extremely agitated.   [name] Penicillins Itching and Rash    Patient Measurements: Height: 5\' 9"  (175.3 cm) Weight: 198 lb 10.2 oz (90.1 kg) IBW/kg (Calculated) : 70.7  Vital Signs:   Intake/Output from previous day: 06/04 0701 - 06/05 0700 In: 1085 [P.O.:240; I.V.:595; IV Piggyback:250] Out: 850 [Urine:850] Intake/Output from this shift: Total I/O In: 240 [P.O.:240] Out: 350 [Urine:350]  Labs:  Recent Labs  03/08/13 2110 03/09/13 0500 03/10/13 0525  WBC 12.9* 12.2* 12.4*  HGB 8.7* 9.1* 8.8*  PLT 510* 500* 498*  CREATININE 1.10 1.11 1.03   Estimated Creatinine Clearance: 109 ml/min (by C-G formula based on Cr of 1.03). No results found for this basename: VANCOTROUGH, Leodis Binet, VANCORANDOM, GENTTROUGH, GENTPEAK, GENTRANDOM, TOBRATROUGH, TOBRAPEAK, TOBRARND, AMIKACINPEAK, AMIKACINTROU, AMIKACIN,  in the last 72 hours   Microbiology: Recent Results (from the past 720 hour(s))  MRSA PCR SCREENING     Status: None   Collection Time    02/27/13  4:15 AM      Result Value Range Status   MRSA by PCR NEGATIVE  NEGATIVE Final   Comment:            The GeneXpert MRSA Assay (FDA     approved for NASAL specimens     only), is one component of a     comprehensive MRSA colonization     surveillance program. It is not     intended to diagnose MRSA     infection nor to guide or     monitor treatment for     MRSA infections.    Assessment: 37 year old male who continues on Vancomycin day # 12 for open fracture prophylaxis.    Vancomycin trough on 6/2 = 7.6 mcg/ml on 1 gm IV q8h.  Vanc trough was below goal and dose was increased to 1250mg  IV q8h on 6/2.  PA-C note says that vancomycin to stop tomorrow.  Goal of Therapy:  Vancomycin trough level 15-20  mcg/ml  Plan:  Continue vancomycin at 1250 mg IV q8h Will not check another level as vancomycin is expected to stop on 6/6 Monitor renal function  Celedonio Miyamoto, PharmD, [facility] Clinical Pharmacist Pager [phone]   03/10/2013 3:22 PM
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
## 2013-03-10 NOTE — Progress Notes (Signed)
Orthopedic Tech Progress Note Patient Details:  [name] 1976-06-13 [id number]  Ortho Devices Type of Ortho Device: Postop shoe/boot Ortho Device/Splint Location: Left prafo Ortho Device/Splint Interventions: Application   Cammer, Mickie Bail 03/10/2013, 1:07 PM

## 2013-03-10 NOTE — Progress Notes (Signed)
Patient is being evaluated for CIR. SW will await their determination.  Sabino Niemann, MSW, [phone]

## 2013-03-10 NOTE — Progress Notes (Signed)
Occupational Therapy Treatment Patient Details Name: [name] MRN: [id number] DOB: Mar 09, 1976 Today's Date: 03/10/2013 Time: 0865-7846 OT Time Calculation (min): 44 min  OT Assessment / Plan / Recommendation Comments on Treatment Session Pt underwent skin graft and removal of external fixator and ORIF of bicondylar tibial plateau and tibial shaft 2 days ago. Progressing in mobility adhering to NWB precautions.  Able to stand for shaving 3 min x 2. Pt continues to be very impulsive and requires full control of session. Pain interfering with ability to benefit from sock aid.     Follow Up Recommendations       Barriers to Discharge       Equipment Recommendations  3 in 1 bedside comode    Recommendations for Other Services    Frequency Min 2X/week   Plan Discharge plan remains appropriate    Precautions / Restrictions Precautions Precautions: Fall Precaution Comments: external fixator removed, has wound vac on L LE Restrictions Weight Bearing Restrictions: Yes LLE Weight Bearing: Non weight bearing   Pertinent Vitals/Pain 6/10 L LE premedicated, repositioned    ADL  Grooming: Set up;Min guard;Shaving Where Assessed - Grooming: Supported standing Lower Body Dressing: Maximal assistance (attempted use of sock aid, too painful on ankle) Where Assessed - Lower Body Dressing: Unsupported sitting Toilet Transfer: Min Pension scheme manager Method: Sit to Barista:  (to  3 in 1 at sink for grooming) Equipment Used: Sock aid;Rolling walker;Gait belt Transfers/Ambulation Related to ADLs: min guard with RW for ambulation ADL Comments: Pt with impulsivity impeding safety.  Very particular about how he wants to do things and have things set up. Can doff L sock with R big toe!    OT Diagnosis:    OT Problem List:   OT Treatment Interventions:     OT Goals ADL Goals Pt Will Perform Lower Body Dressing: with set-up;Unsupported;with adaptive equipment;Sit to  stand from chair;Sit to stand from bed ADL Goal: Lower Body Dressing - Progress: Not met Pt Will Transfer to Toilet: with supervision;Stand pivot transfer;Ambulation;with DME;3-in-1;Maintaining weight bearing status ADL Goal: Toilet Transfer - Progress: Progressing toward goals Pt Will Perform Toileting - Clothing Manipulation: with modified independence;Standing Pt Will Perform Toileting - Hygiene: with modified independence;Sit to stand from 3-in-1/toilet Miscellaneous OT Goals Miscellaneous OT Goal #1: Pt will be Mod I in and OOB for BADLs. OT Goal: Miscellaneous Goal #1 - Progress: Met Miscellaneous OT Goal #2: Pt will tolerate and understand the use of the foot strap on LLE. OT Goal: Miscellaneous Goal #2 - Progress: Discontinued (comment) (EF removed)  Visit Information  Last OT Received On: 03/10/13 Assistance Needed: +2 PT/OT Co-Evaluation/Treatment: Yes    Subjective Data      Prior Functioning       Cognition  Cognition Arousal/Alertness: Awake/alert Behavior During Therapy: Impulsive Overall Cognitive Status: Within Functional Limits for tasks assessed    Mobility  Bed Mobility Bed Mobility: Supine to Sit Supine to Sit: 6: Modified independent (Device/Increase time);HOB elevated;With rails Sitting - Scoot to Edge of Bed: 6: Modified independent (Device/Increase time) Details for Bed Mobility Assistance: HOB slightly elevated Transfers Transfers: Sit to Stand;Stand to Sit Sit to Stand: 4: Min guard;With upper extremity assist;From bed;From chair/3-in-1 Stand to Sit: 5: Supervision;With upper extremity assist;To chair/3-in-1 Details for Transfer Assistance: Verbal cues for safety due to impulsivity.    Exercises      Balance Balance Balance Assessed: Yes Static Standing Balance Static Standing - Balance Support: Left upper extremity supported;During functional activity Static Standing -  Level of Assistance: 5: Stand by assistance (min guard)   End of Session  OT - End of Session Activity Tolerance: Patient tolerated treatment well Patient left: in chair;with call bell/phone within reach;with nursing in room Nurse Communication:  (pt with concerns about R LE incision)  GO     Evern Bio 03/10/2013, 10:04 AM [phone]

## 2013-03-10 NOTE — ED Provider Notes (Signed)
Medical screening examination/treatment/procedure(s) were performed by non-physician practitioner and as supervising physician I was immediately available for consultation/collaboration.  Toy Baker, MD 03/10/13 [phone]

## 2013-03-10 NOTE — Progress Notes (Signed)
[facility]  Subjective  - Soreness and pain left leg.   Objective 115/71 126 99.2 F (37.3 C) (Oral) 18 98%  Intake/Output Summary (Last 24 hours) at 03/10/13 0824 Last data filed at 03/10/13 0256  Gross per 24 hour  Intake   1085 ml  Output    850 ml  Net    235 ml    Palpable PT left  Skin is warm and dry   Assessment/Planning 02/27/2013 Left distal superficial femoral artery to posterior tibial artery bypass graft with contralateral reversed greater saphenous vein. #2: 4 compartment fasciotomy We will ask Dr. Myra Gianotti if the staples can be removed from the left thigh.   Clinton Gallant [facility] 03/10/2013 8:24 AM --  Laboratory Lab Results:  Recent Labs  03/09/13 0500 03/10/13 0525  WBC 12.2* 12.4*  HGB 9.1* 8.8*  HCT 28.1* 27.1*  PLT 500* 498*   BMET  Recent Labs  03/09/13 0500 03/10/13 0525  NA 137 136  K 4.4 4.0  CL 103 103  CO2 25 24  GLUCOSE 117* 109*  BUN 7 6  CREATININE 1.11 1.03  CALCIUM 8.3* 7.9*    COAG Lab Results  Component Value Date   INR 0.93 02/26/2013   No results found for this basename: PTT

## 2013-03-11 MED ORDER — DSS 100 MG PO CAPS: 100.0000 mg | ORAL_CAPSULE | Freq: Two times a day (BID) | ORAL | Status: DC

## 2013-03-11 MED ORDER — METHOCARBAMOL 500 MG PO TABS: 500.0000 mg | ORAL_TABLET | Freq: Four times a day (QID) | ORAL | Status: DC | PRN

## 2013-03-11 MED ORDER — OXYCODONE HCL 5 MG PO TABS: 5.0000 mg | ORAL_TABLET | ORAL | Status: DC | PRN

## 2013-03-11 MED ORDER — TRAMADOL HCL 50 MG PO TABS: 100.0000 mg | ORAL_TABLET | Freq: Two times a day (BID) | ORAL | Status: DC | PRN

## 2013-03-11 MED ORDER — OXYCODONE-ACETAMINOPHEN 10-325 MG PO TABS: 1.0000 | ORAL_TABLET | Freq: Four times a day (QID) | ORAL | Status: DC | PRN

## 2013-03-11 MED ORDER — POLYETHYLENE GLYCOL 3350 17 G PO PACK: 17.0000 g | PACK | Freq: Every day | ORAL | Status: DC | PRN

## 2013-03-11 MED ORDER — ENOXAPARIN SODIUM 40 MG/0.4ML ~~LOC~~ SOLN: 40.0000 mg | SUBCUTANEOUS | Status: DC

## 2013-03-11 NOTE — Progress Notes (Signed)
Physical Therapy Treatment Patient Details Name: [name] MRN: [id number] DOB: 08/20/1976 Today's Date: 03/11/2013 Time: 4098-1191 PT Time Calculation (min): 23 min  PT Assessment / Plan / Recommendation Comments on Treatment Session  Pt. c/o feeling pressure left 5th MTP from prafo.  It was removed and skin checked.  No redness obseverved, callous present. Extra padding placed using 4x4s  Pt. educated on having skin checked often and notifying  someone if pressure returns.  He is agreeable.  Pt. is not able to negotiate steps due to pain level and NWB status.  Has 5 steps to access home, therefore believe ambualnce transport safest and most practical alternative.  Pt. in agreement.  Have discussed equipment needs and HHPT recommendation with case manager Vance Peper.  All is ordered.      Follow Up Recommendations  Supervision/Assistance - 24 hour     Does the patient have the potential to tolerate intense rehabilitation     Barriers to Discharge        Equipment Recommendations  Rolling walker with 5" wheels;Wheelchair (measurements PT);Wheelchair cushion (measurements PT)    Recommendations for Other Services    Frequency Min 5X/week   Plan Discharge plan remains appropriate;Frequency remains appropriate    Precautions / Restrictions Precautions Precautions: Fall Restrictions Weight Bearing Restrictions: Yes LLE Weight Bearing: Non weight bearing   Pertinent Vitals/Pain See vitals tab     Mobility  Bed Mobility Bed Mobility: Supine to Sit;Sit to Supine Supine to Sit: 6: Modified independent (Device/Increase time);HOB elevated;With rails Sit to Supine: 6: Modified independent (Device/Increase time);With rail;HOB elevated Details for Bed Mobility Assistance: HOB slightly elevated Transfers Transfers: Sit to Stand;Stand to Sit Sit to Stand: 4: Min guard;With upper extremity assist;From bed;From chair/3-in-1 Stand to Sit: 5: Supervision;With upper extremity assist;With  armrests;To chair/3-in-1;To bed Details for Transfer Assistance: cues to move more slowly and for overall safety Ambulation/Gait Ambulation/Gait Assistance: 4: Min assist Ambulation Distance (Feet): 20 Feet (10x2) Assistive device: Rolling walker Ambulation/Gait Assistance Details: cues for safe turning and negotiating around objects as he tends to move quickly and impulsively Gait Pattern: Step-to pattern (single leg hop)    Exercises Total Joint Exercises Knee Flexion: AROM;Left;10 reps;Seated General Exercises - Lower Extremity Ankle Circles/Pumps: Left;AROM;AAROM;15 reps;Supine   PT Diagnosis:    PT Problem List:   PT Treatment Interventions:     PT Goals Acute Rehab PT Goals Pt will go Supine/Side to Sit: with HOB 0 degrees PT Goal: Supine/Side to Sit - Progress: Progressing toward goal Pt will go Sit to Supine/Side: with modified independence PT Goal: Sit to Supine/Side - Progress: Met Pt will go Sit to Stand: with modified independence PT Goal: Sit to Stand - Progress: Progressing toward goal Pt will go Stand to Sit: with modified independence PT Goal: Stand to Sit - Progress: Progressing toward goal Pt will Ambulate: 51 - 150 feet;with modified independence;with least restrictive assistive device PT Goal: Ambulate - Progress: Progressing toward goal PT Goal: Up/Down Stairs - Progress: Discontinued (comment) (due to pt. will have ambulance transport home)  Visit Information  Last PT Received On: 03/11/13 Assistance Needed: +1    Subjective Data  Subjective: Pt. reports increased pain in left leg following walk to and from bathroom   Cognition  Cognition Arousal/Alertness: Awake/alert Behavior During Therapy: WFL for tasks assessed/performed Overall Cognitive Status: Within Functional Limits for tasks assessed    Balance     End of Session PT - End of Session Equipment Utilized During Treatment: Gait belt;Other (comment) (PRAFO  left foot) Activity Tolerance:  Patient tolerated treatment well;Patient limited by pain;Other (comment) (pain increased  in L LE following sitting on 3n1 for + BM) Patient left: in bed;with call bell/phone within reach;with family/visitor present (mom and dad) Nurse Communication: Mobility status;Patient requests pain meds;Precautions;Weight bearing status   GP     Ferman Hamming 03/11/2013, 12:12 PM Weldon Picking PT Acute Rehab Services [phone] Beeper [phone]

## 2013-03-11 NOTE — Progress Notes (Signed)
[facility] [place]  Subjective  - Doing better.  No new complaints   Objective 121/76 118 99.5 F (37.5 C) (Oral) 18 98%  Intake/Output Summary (Last 24 hours) at 03/11/13 1324 Last data filed at 03/11/13 4098  Gross per 24 hour  Intake    240 ml  Output   2525 ml  Net  -2285 ml    Upper thigh incision well healed.  Sutures removed patient tolerated well. Distally toes warm to touch and mod. Active range of motion.  Assessment/Planning: 02/27/2013 Left distal superficial femoral artery to posterior tibial artery bypass graft with contralateral reversed greater saphenous vein. #2: 4 compartment fasciotomy     [name], [name] [name] 03/11/2013 1:24 PM --  Laboratory Lab Results:  Recent Labs  03/09/13 0500 03/10/13 0525  WBC 12.2* 12.4*  HGB 9.1* 8.8*  HCT 28.1* 27.1*  PLT 500* 498*   BMET  Recent Labs  03/09/13 0500 03/10/13 0525  NA 137 136  K 4.4 4.0  CL 103 103  CO2 25 24  GLUCOSE 117* 109*  BUN 7 6  CREATININE 1.11 1.03  CALCIUM 8.3* 7.9*    COAG Lab Results  Component Value Date   INR 0.93 02/26/2013   No results found for this basename: PTT

## 2013-03-11 NOTE — Progress Notes (Signed)
Clinical social worker assisted with patient discharge to skilled nursing facility, [facility] and [address].  CSW addressed all family questions and concerns. CSW copied chart and added all important documents. CSW also set up patient transportation with [employer]. Clinical Social Worker will sign off for now as social work intervention is no longer needed.   Sabino Niemann, MSW, [phone]

## 2013-03-11 NOTE — Progress Notes (Signed)
Patient complained regarding Prafo boot being uncomfortable even after several adjustments.  Tech went in room and discovered that the one of the straps were damaged/removed. He used an ace wrap to temporarily fix/hold boot in place. Patient will need a new boot.  In addition, patient would not allow anyone to clean/clear off his table even though we attempted several times. Patient stated "I will tell you when I want it cleaned".
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
## 2013-03-11 NOTE — Progress Notes (Addendum)
Per Rehab MD pt does not meet CIR criteria-pt too high level.  Message about this left for pt's SW, Amy & called to pt's CM, Darl Pikes.  CIR to sign off.  [phone]

## 2013-03-11 NOTE — Clinical Social Work Placement (Signed)
Clinical Social Work Department  CLINICAL SOCIAL WORK PLACEMENT NOTE  03/11/2013  Patient: [name] Account Number: [id number]  Admit date: 03/10/13  Clinical Social Worker: Sabino Niemann LCSWA Date/time: 03/11/2013 4:30 PM  Clinical Social Work is seeking post-discharge placement for this patient at the following level of care: SKILLED NURSING (*CSW will update this form in Epic as items are completed)  03/11/2013 Patient/family provided with [facility] Department of Clinical Social Work's list of facilities offering this level of care within the geographic area requested by the patient (or if unable, by the patient's family).  6/6/2014Patient/family informed of their freedom to choose among providers that offer the needed level of care, that participate in Medicare, Medicaid or managed care program needed by the patient, have an available bed and are willing to accept the patient.  6/6/2014Patient/family informed of [facility]' ownership interest in [facility], as well as of the fact that they are under no obligation to receive care at this facility.  PASARR submitted to EDS on 03/11/2013 PASARR number received from EDS on 03/11/2013 FL2 transmitted to all facilities in geographic area requested by pt/family on 03/11/2013  FL2 transmitted to all facilities within larger geographic area on  Patient informed that his/her managed care company has contracts with or will negotiate with certain facilities, including the following:  Patient/family informed of bed offers received: 03/11/2013 Patient chooses bed at [facility] and rehab Physician recommends and patient chooses bed at  Patient to be transferred to on 03/11/2013 Patient to be transferred to facility by [facility] The following physician request were entered in Epic:  Additional Comments:
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
## 2013-03-11 NOTE — Clinical Social Work Psychosocial (Signed)
Clinical Social Work Department  BRIEF PSYCHOSOCIAL ASSESSMENT  Patient: [name]  Account Number: [id number]   Admit date: 02/26/13 Clinical Social Worker Sabino Niemann, MSW Date/Time: 03/11/2013 8:30 AM Referred by: Physician Date Referred: 03/10/13 Referred for   SNF Placement   Other Referral:  Interview type: Patient and patient's parents Other interview type: PSYCHOSOCIAL DATA  Living Status: Patient lives with his mom and dad Admitted from facility:  Level of care:  Primary support name: Margie Pavlich Primary support relationship to patient: Mother Degree of support available:  Strong and vested  CURRENT CONCERNS  Current Concerns   Post-Acute Placement   Other Concerns:  SOCIAL WORK ASSESSMENT / PLAN  CSW met with pt- patient was very tearful and had trouble making a decision. Patient finally decided to go to [facility]: PT recommendation for SNF.   Pt lives with his parents  CSW explained placement process and answered questions.   Pt reported wanting a facility in [facility]    CSW completed FL2 and initiated SNF search.     Assessment/plan status: Information/Referral to [facility]  Other assessment/ plan:  Information/referral to community resources:  SNF     PATIENT'S/FAMILY'S RESPONSE TO PLAN OF CARE:  After much discussion pateint reported he is agreeable to ST SNF in order to increase strength and independence with mobility prior to returning home  Pt verbalized understanding of placement process and appreciation for CSW assist.   Sabino Niemann, MSW [phone]

## 2013-03-12 LAB — TYPE AND SCREEN: Unit division: 0

## 2013-03-12 NOTE — Progress Notes (Signed)
I saw and examined the patient with Mr. Renae Fickle, communicating the findings and plan noted above, and removing wound vac, replacing with gauze dressing.  Myrene Galas, MD [facility] [phone] (p)

## 2013-03-12 NOTE — Progress Notes (Signed)
I have seen and examined the patient. I agree with the findings and plan above.  Budd Palmer, MD 03/12/2013 11:11 PM

## 2013-03-16 ENCOUNTER — Telehealth: Payer: Self-pay | Admitting: Surgery

## 2013-03-16 ENCOUNTER — Other Ambulatory Visit: Payer: Self-pay | Admitting: *Deleted

## 2013-03-16 DIAGNOSIS — S8992XA Unspecified injury of left lower leg, initial encounter: Secondary | ICD-10-CM

## 2013-03-16 DIAGNOSIS — Z48812 Encounter for surgical aftercare following surgery on the circulatory system: Secondary | ICD-10-CM
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
## 2013-05-13 DIAGNOSIS — T50901A Poisoning by unspecified drugs, medicaments and biological substances, accidental (unintentional), initial encounter: Secondary | ICD-10-CM | POA: Insufficient documentation

## 2013-05-31 ENCOUNTER — Other Ambulatory Visit: Payer: Self-pay | Admitting: *Deleted

## 2013-05-31 DIAGNOSIS — I739 Peripheral vascular disease, unspecified: Secondary | ICD-10-CM

## 2013-05-31 DIAGNOSIS — Z48812 Encounter for surgical aftercare following surgery on the circulatory system: Secondary | ICD-10-CM

## 2013-06-13 ENCOUNTER — Ambulatory Visit: Payer: Medicare Other | Admitting: Surgery

## 2013-06-17 ENCOUNTER — Encounter: Payer: Self-pay | Admitting: Surgery

## 2013-06-20 ENCOUNTER — Encounter (INDEPENDENT_AMBULATORY_CARE_PROVIDER_SITE_OTHER): Payer: Medicare Other | Admitting: *Deleted

## 2013-06-20 ENCOUNTER — Encounter: Payer: Self-pay | Admitting: Surgery

## 2013-06-20 ENCOUNTER — Ambulatory Visit (INDEPENDENT_AMBULATORY_CARE_PROVIDER_SITE_OTHER): Payer: Medicare Other | Admitting: Surgery

## 2013-06-20 VITALS — BP 121/77 | HR 107 | Ht 69.0 in | Wt 199.2 lb

## 2013-06-20 DIAGNOSIS — S8990XA Unspecified injury of unspecified lower leg, initial encounter: Secondary | ICD-10-CM

## 2013-06-20 DIAGNOSIS — S8992XA Unspecified injury of left lower leg, initial encounter: Secondary | ICD-10-CM

## 2013-06-20 DIAGNOSIS — T148XXA Other injury of unspecified body region, initial encounter: Secondary | ICD-10-CM

## 2013-06-20 DIAGNOSIS — Z48812 Encounter for surgical aftercare following surgery on the circulatory system: Secondary | ICD-10-CM

## 2013-06-20 DIAGNOSIS — I739 Peripheral vascular disease, unspecified: Secondary | ICD-10-CM
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
## 2013-07-18 ENCOUNTER — Encounter (HOSPITAL_COMMUNITY): Payer: Self-pay | Admitting: Pharmacy Technician

## 2013-07-20 NOTE — Pre-Procedure Instructions (Deleted)
[name]  07/20/2013   Your procedure is scheduled on:  Tuesday, October 21st   Report to [facility]  2 * 3 at 6:00 AM.             (FREE Valet Parking)  Call this number if you have problems the morning of surgery: [phone]   Remember:   Do not eat food or drink liquids after midnight Monday.   Take these medicines the morning of surgery with A SIP OF WATER: Oxycodone, Lyrica   Do not wear jewelry.  Do not wear lotions, powders, or colognes. You may wear deodorant.   Men may shave face and neck.   Do not bring valuables to the hospital.  [facility] is not responsible for any belongings or valuables.               Contacts, dentures or bridgework may not be worn into surgery.  Leave suitcase in the car. After surgery it may be brought to your room.  For patients admitted to the hospital, discharge time is determined by your                treatment team.               Name and phone number of your driver:    Special Instructions: Shower using CHG 2 nights before surgery and the night before surgery.  If you shower the day of surgery use CHG.  Use special wash - you have one bottle of CHG for all showers.  You should use approximately 1/3 of the bottle for each shower.   Please read over the following fact sheets that you were given: Pain Booklet, Coughing and Deep Breathing and Surgical Site Infection Prevention

## 2013-07-20 NOTE — Pre-Procedure Instructions (Signed)
Jonathandavid Marlett  07/20/2013   Your procedure is scheduled on:  October 21st, Tuesday   Report to [facility]  2 * 3 at 6:00 AM.             (FREE Valet Parking)  Call this number if you have problems the morning of surgery: [phone]   Remember:   Do not eat food or drink liquids after midnight Monday.   Take these medicines the morning of surgery with A SIP OF WATER: Nexium, Lyrica, Oxycodone   Do not wear jewelry.  Do not wear lotions, powders, or colognes. You may wear deodorant.   Men may shave face and neck.   Do not bring valuables to the hospital.  [facility] is not responsible for any belongings or valuables.               Contacts, dentures or bridgework may not be worn into surgery.  Leave suitcase in the car. After surgery it may be brought to your room.  For patients admitted to the hospital, discharge time is determined by your treatment team.            Name and phone number of your driver:    Special Instructions: Shower using CHG 2 nights before surgery and the night before surgery.  If you shower the day of surgery use CHG.  Use special wash - you have one bottle of CHG for all showers.  You should use approximately 1/3 of the bottle for each shower.   Please read over the following fact sheets that you were given: Pain Booklet and Surgical Site Infection Prevention

## 2013-07-21 ENCOUNTER — Encounter (HOSPITAL_COMMUNITY): Payer: Self-pay

## 2013-07-21 ENCOUNTER — Encounter (HOSPITAL_COMMUNITY)
Admission: RE | Admit: 2013-07-21 | Discharge: 2013-07-21 | Disposition: A | Discharge status: Home / Self Care | Payer: Medicare Other | Source: Ambulatory Visit | Attending: Anesthesiology | Admitting: Anesthesiology

## 2013-07-21 ENCOUNTER — Encounter (HOSPITAL_COMMUNITY)
Admission: RE | Admit: 2013-07-21 | Discharge: 2013-07-21 | Disposition: A | Discharge status: Home / Self Care | Payer: Medicare Other | Source: Ambulatory Visit | Attending: Orthopedic Surgery | Admitting: Orthopedic Surgery

## 2013-07-21 DIAGNOSIS — Z01810 Encounter for preprocedural cardiovascular examination: Secondary | ICD-10-CM | POA: Insufficient documentation

## 2013-07-21 DIAGNOSIS — Z01812 Encounter for preprocedural laboratory examination: Secondary | ICD-10-CM | POA: Insufficient documentation

## 2013-07-21 DIAGNOSIS — Z01818 Encounter for other preprocedural examination: Secondary | ICD-10-CM | POA: Insufficient documentation

## 2013-07-21 HISTORY — DX: Unspecified chronic bronchitis: J42

## 2013-07-21 HISTORY — DX: Anxiety disorder, unspecified: F41.9

## 2013-07-21 LAB — URINALYSIS, ROUTINE W REFLEX MICROSCOPIC
Glucose, UA: NEGATIVE mg/dL
Hgb urine dipstick: NEGATIVE
Ketones, ur: NEGATIVE mg/dL
Leukocytes, UA: NEGATIVE
Nitrite: NEGATIVE
Protein, ur: NEGATIVE mg/dL
Specific Gravity, Urine: 1.006 (ref 1.005–1.030)
Urobilinogen, UA: 0.2 mg/dL (ref 0.0–1.0)
pH: 6.5 (ref 5.0–8.0)

## 2013-07-21 LAB — CBC WITH DIFFERENTIAL/PLATELET
Basophils Absolute: 0.1 10*3/uL (ref 0.0–0.1)
Hemoglobin: 15.7 g/dL (ref 13.0–17.0)
Lymphocytes Relative: 26 % (ref 12–46)
Lymphs Abs: 3.1 10*3/uL (ref 0.7–4.0)
MCV: 80.3 fL (ref 78.0–100.0)
Neutro Abs: 7.2 10*3/uL (ref 1.7–7.7)
Neutrophils Relative %: 61 % (ref 43–77)
Platelets: 353 10*3/uL (ref 150–400)
RBC: 5.64 MIL/uL (ref 4.22–5.81)
WBC: 11.7 10*3/uL — ABNORMAL HIGH (ref 4.0–10.5)

## 2013-07-21 LAB — PROTIME-INR: INR: 0.92 (ref 0.00–1.49)

## 2013-07-21 LAB — COMPREHENSIVE METABOLIC PANEL
ALT: 23 U/L (ref 0–53)
AST: 15 U/L (ref 0–37)
Albumin: 3.8 g/dL (ref 3.5–5.2)
Alkaline Phosphatase: 146 U/L — ABNORMAL HIGH (ref 39–117)
CO2: 26 mEq/L (ref 19–32)
Calcium: 9 mg/dL (ref 8.4–10.5)
Creatinine, Ser: 0.91 mg/dL (ref 0.50–1.35)
GFR calc Af Amer: >90 mL/min (ref >90)
GFR calc non Af Amer: >90 mL/min (ref >90)
Glucose, Bld: 90 mg/dL (ref 70–99)
Potassium: 4.4 mEq/L (ref 3.5–5.1)
Sodium: 141 mEq/L (ref 135–145)

## 2013-07-21 LAB — RAPID URINE DRUG SCREEN, HOSP PERFORMED
Barbiturates: NOT DETECTED
Opiates: NOT DETECTED
Tetrahydrocannabinol: POSITIVE — AB

## 2013-07-21 LAB — APTT: aPTT: 30 seconds (ref 24–37)

## 2013-07-21 NOTE — Progress Notes (Signed)
Notified Dr. Magdalene Patricia office made aware of UA results---they will pass this on to Dr,. Handy.  DA

## 2013-07-22 LAB — DRUGS OF ABUSE SCREEN W/O ALC, ROUTINE URINE
Amphetamine Screen, Ur: NEGATIVE
Barbiturate Quant, Ur: NEGATIVE
Benzodiazepines.: NEGATIVE
Cocaine Metabolites: NEGATIVE
Creatinine,U: 39 mg/dL
Methadone: NEGATIVE
Phencyclidine (PCP): NEGATIVE

## 2013-07-25 MED ORDER — VANCOMYCIN HCL IN DEXTROSE 1-5 GM/200ML-% IV SOLN
1000.0000 mg | INTRAVENOUS | Status: AC
  Administered 2013-07-26: 1000 mg via INTRAVENOUS
  Filled 2013-07-25: qty 200

## 2013-07-26 ENCOUNTER — Inpatient Hospital Stay (HOSPITAL_COMMUNITY): Payer: Medicare Other

## 2013-07-26 ENCOUNTER — Encounter (HOSPITAL_COMMUNITY): Payer: Medicare Other | Admitting: Anesthesiology

## 2013-07-26 ENCOUNTER — Ambulatory Visit (HOSPITAL_COMMUNITY)
Admission: RE | Admit: 2013-07-26 | Discharge: 2013-07-26 | DRG: 494 | Disposition: A | Discharge status: Home / Self Care | Payer: Medicare Other | Source: Ambulatory Visit | Attending: Orthopedic Surgery | Admitting: Orthopedic Surgery

## 2013-07-26 ENCOUNTER — Inpatient Hospital Stay (HOSPITAL_COMMUNITY): Payer: Medicare Other | Admitting: Anesthesiology

## 2013-07-26 ENCOUNTER — Encounter (HOSPITAL_COMMUNITY)
Admission: RE | Disposition: A | Discharge status: Home / Self Care | Payer: Self-pay | Source: Ambulatory Visit | Attending: Orthopedic Surgery

## 2013-07-26 ENCOUNTER — Encounter (HOSPITAL_COMMUNITY): Payer: Self-pay | Admitting: Anesthesiology

## 2013-07-26 DIAGNOSIS — F17200 Nicotine dependence, unspecified, uncomplicated: Secondary | ICD-10-CM | POA: Insufficient documentation

## 2013-07-26 DIAGNOSIS — G8929 Other chronic pain: Secondary | ICD-10-CM | POA: Insufficient documentation

## 2013-07-26 DIAGNOSIS — S82202N Unspecified fracture of shaft of left tibia, subsequent encounter for open fracture type IIIA, IIIB, or IIIC with nonunion: Secondary | ICD-10-CM

## 2013-07-26 DIAGNOSIS — IMO0002 Reserved for concepts with insufficient information to code with codable children: Secondary | ICD-10-CM | POA: Insufficient documentation

## 2013-07-26 HISTORY — PX: ORIF TIBIA PLATEAU: SHX2132

## 2013-07-26 SURGERY — OPEN REDUCTION INTERNAL FIXATION (ORIF) TIBIAL PLATEAU
Anesthesia: General | Site: Knee | Laterality: Left | Wound class: Clean

## 2013-07-26 MED ORDER — LACTATED RINGERS IV SOLN: INTRAVENOUS | Status: DC

## 2013-07-26 MED ORDER — 0.9 % SODIUM CHLORIDE (POUR BTL) OPTIME
TOPICAL | Status: DC | PRN
  Administered 2013-07-26: 1000 mL

## 2013-07-26 MED ORDER — OXYCODONE-ACETAMINOPHEN 10-325 MG PO TABS: 1.0000 | ORAL_TABLET | ORAL | Status: DC | PRN

## 2013-07-26 MED ORDER — CARISOPRODOL 350 MG PO TABS: 350.0000 mg | ORAL_TABLET | Freq: Three times a day (TID) | ORAL | Status: DC | PRN

## 2013-07-26 MED ORDER — OXYCODONE HCL 5 MG PO TABS
5.0000 mg | ORAL_TABLET | Freq: Once | ORAL | Status: AC | PRN
  Administered 2013-07-26: 5 mg via ORAL

## 2013-07-26 MED ORDER — LIDOCAINE HCL (CARDIAC) 20 MG/ML IV SOLN
INTRAVENOUS | Status: DC | PRN
  Administered 2013-07-26: 60 mg via INTRAVENOUS

## 2013-07-26 MED ORDER — PROMETHAZINE HCL 25 MG/ML IJ SOLN
6.2500 mg | INTRAMUSCULAR | Status: DC | PRN
Start: 2013-07-26 — End: 2013-07-26
  Administered 2013-07-26: 6.25 mg via INTRAVENOUS

## 2013-07-26 MED ORDER — ROCURONIUM BROMIDE 100 MG/10ML IV SOLN
INTRAVENOUS | Status: DC | PRN
  Administered 2013-07-26: 50 mg via INTRAVENOUS

## 2013-07-26 MED ORDER — ACETAMINOPHEN 500 MG PO TABS
1000.0000 mg | ORAL_TABLET | Freq: Once | ORAL | Status: AC
  Administered 2013-07-26: 1000 mg via ORAL

## 2013-07-26 MED ORDER — OXYCODONE HCL 5 MG PO TABS
ORAL_TABLET | ORAL | Status: AC
  Filled 2013-07-26: qty 1

## 2013-07-26 MED ORDER — PANTOPRAZOLE SODIUM 40 MG IV SOLR
40.0000 mg | Freq: Once | INTRAVENOUS | Status: AC
  Administered 2013-07-26: 40 mg via INTRAVENOUS
  Filled 2013-07-26: qty 40

## 2013-07-26 MED ORDER — FENTANYL CITRATE 0.05 MG/ML IJ SOLN
INTRAMUSCULAR | Status: DC | PRN
  Administered 2013-07-26: 100 ug via INTRAVENOUS
  Administered 2013-07-26: 50 ug via INTRAVENOUS
  Administered 2013-07-26: 100 ug via INTRAVENOUS

## 2013-07-26 MED ORDER — HYDROMORPHONE HCL PF 1 MG/ML IJ SOLN
INTRAMUSCULAR | Status: AC
  Filled 2013-07-26: qty 1

## 2013-07-26 MED ORDER — BUPIVACAINE HCL (PF) 0.25 % IJ SOLN
INTRAMUSCULAR | Status: AC
  Filled 2013-07-26: qty 30

## 2013-07-26 MED ORDER — PROMETHAZINE HCL 25 MG/ML IJ SOLN
INTRAMUSCULAR | Status: AC
  Filled 2013-07-26: qty 1

## 2013-07-26 MED ORDER — OXYCODONE HCL 5 MG PO TABS: 5.0000 mg | ORAL_TABLET | ORAL | Status: DC | PRN

## 2013-07-26 MED ORDER — ONDANSETRON HCL 4 MG/2ML IJ SOLN
INTRAMUSCULAR | Status: DC | PRN
  Administered 2013-07-26: 4 mg via INTRAMUSCULAR

## 2013-07-26 MED ORDER — VECURONIUM BROMIDE 10 MG IV SOLR
INTRAVENOUS | Status: DC | PRN
  Administered 2013-07-26: 2 mg via INTRAVENOUS

## 2013-07-26 MED ORDER — NEOSTIGMINE METHYLSULFATE 1 MG/ML IJ SOLN
INTRAMUSCULAR | Status: DC | PRN
  Administered 2013-07-26: 5 mg via INTRAVENOUS

## 2013-07-26 MED ORDER — PANTOPRAZOLE SODIUM 20 MG PO TBEC: 20.0000 mg | DELAYED_RELEASE_TABLET | Freq: Every day | ORAL | Status: DC

## 2013-07-26 MED ORDER — HYDROMORPHONE HCL PF 1 MG/ML IJ SOLN
INTRAMUSCULAR | Status: DC | PRN
  Administered 2013-07-26 (×2): 0.5 mg via INTRAVENOUS

## 2013-07-26 MED ORDER — LACTATED RINGERS IV SOLN
INTRAVENOUS | Status: DC | PRN
  Administered 2013-07-26 (×2): via INTRAVENOUS

## 2013-07-26 MED ORDER — PANTOPRAZOLE SODIUM 40 MG IV SOLR: 40.0000 mg | INTRAVENOUS | Status: DC

## 2013-07-26 MED ORDER — OXYCODONE HCL 5 MG/5ML PO SOLN: 5.0000 mg | Freq: Once | ORAL | Status: AC | PRN

## 2013-07-26 MED ORDER — HYDROMORPHONE HCL PF 1 MG/ML IJ SOLN
0.2500 mg | INTRAMUSCULAR | Status: DC | PRN
  Administered 2013-07-26 (×2): 0.5 mg via INTRAVENOUS

## 2013-07-26 MED ORDER — PROPOFOL 10 MG/ML IV BOLUS
INTRAVENOUS | Status: DC | PRN
  Administered 2013-07-26 (×4): 10 mg via INTRAVENOUS
  Administered 2013-07-26: 200 mg via INTRAVENOUS
  Administered 2013-07-26 (×5): 10 mg via INTRAVENOUS

## 2013-07-26 MED ORDER — GLYCOPYRROLATE 0.2 MG/ML IJ SOLN
INTRAMUSCULAR | Status: DC | PRN
  Administered 2013-07-26: .8 mg via INTRAVENOUS

## 2013-07-26 SURGICAL SUPPLY — 72 items
BANDAGE ELASTIC 4 VELCRO ST LF (GAUZE/BANDAGES/DRESSINGS) ×2 IMPLANT
BANDAGE ELASTIC 6 VELCRO ST LF (GAUZE/BANDAGES/DRESSINGS) ×2 IMPLANT
BANDAGE ESMARK 6X9 LF (GAUZE/BANDAGES/DRESSINGS) ×1 IMPLANT
BANDAGE GAUZE ELAST BULKY 4 IN (GAUZE/BANDAGES/DRESSINGS) ×2 IMPLANT
BLADE SURG 10 STRL SS (BLADE) ×2 IMPLANT
BLADE SURG 15 STRL LF DISP TIS (BLADE) ×1 IMPLANT
BLADE SURG 15 STRL SS (BLADE) ×1
BLADE SURG ROTATE 9660 (MISCELLANEOUS) IMPLANT
BNDG COHESIVE 4X5 TAN STRL (GAUZE/BANDAGES/DRESSINGS) ×2 IMPLANT
BNDG ESMARK 6X9 LF (GAUZE/BANDAGES/DRESSINGS) ×2
BONE CANC CHIPS 40CC CAN1/2 (Bone Implant) ×2 IMPLANT
BONE CHIP PRESERV 20CC (Bone Implant) ×2 IMPLANT
BRUSH SCRUB DISP (MISCELLANEOUS) ×4 IMPLANT
CHIPS CANC BONE 40CC CAN1/2 (Bone Implant) ×1 IMPLANT
CLOTH BEACON ORANGE TIMEOUT ST (SAFETY) ×2 IMPLANT
COVER MAYO STAND STRL (DRAPES) ×2 IMPLANT
DRAPE C-ARM 42X72 X-RAY (DRAPES) ×2 IMPLANT
DRAPE C-ARMOR (DRAPES) ×2 IMPLANT
DRAPE INCISE IOBAN 66X45 STRL (DRAPES) ×2 IMPLANT
DRAPE ORTHO SPLIT 77X108 STRL (DRAPES)
DRAPE SURG ORHT 6 SPLT 77X108 (DRAPES) IMPLANT
DRAPE U-SHAPE 47X51 STRL (DRAPES) ×2 IMPLANT
DRSG ADAPTIC 3X8 NADH LF (GAUZE/BANDAGES/DRESSINGS) ×2 IMPLANT
DRSG PAD ABDOMINAL 8X10 ST (GAUZE/BANDAGES/DRESSINGS) ×8 IMPLANT
ELECT REM PT RETURN 9FT ADLT (ELECTROSURGICAL) ×2
ELECTRODE REM PT RTRN 9FT ADLT (ELECTROSURGICAL) ×1 IMPLANT
EVACUATOR 1/8 PVC DRAIN (DRAIN) IMPLANT
EVACUATOR 3/16  PVC DRAIN (DRAIN)
EVACUATOR 3/16 PVC DRAIN (DRAIN) IMPLANT
GLOVE BIO SURGEON STRL SZ7.5 (GLOVE) ×2 IMPLANT
GLOVE BIO SURGEON STRL SZ8 (GLOVE) ×2 IMPLANT
GLOVE BIOGEL PI IND STRL 7.5 (GLOVE) ×1 IMPLANT
GLOVE BIOGEL PI IND STRL 8 (GLOVE) ×1 IMPLANT
GLOVE BIOGEL PI INDICATOR 7.5 (GLOVE) ×1
GLOVE BIOGEL PI INDICATOR 8 (GLOVE) ×1
GOWN PREVENTION PLUS XLARGE (GOWN DISPOSABLE) ×2 IMPLANT
GOWN STRL NON-REIN LRG LVL3 (GOWN DISPOSABLE) ×4 IMPLANT
IMMOBILIZER KNEE 22 UNIV (SOFTGOODS) ×2 IMPLANT
KIT BASIN OR (CUSTOM PROCEDURE TRAY) ×2 IMPLANT
KIT INFUSE LRG II (Orthopedic Implant) ×2 IMPLANT
KIT ROOM TURNOVER OR (KITS) ×2 IMPLANT
MANIFOLD NEPTUNE II (INSTRUMENTS) ×2 IMPLANT
NDL SUT 6 .5 CRC .975X.05 MAYO (NEEDLE) IMPLANT
NEEDLE 22X1 1/2 (OR ONLY) (NEEDLE) IMPLANT
NEEDLE MAYO TAPER (NEEDLE)
NS IRRIG 1000ML POUR BTL (IV SOLUTION) ×2 IMPLANT
PACK ORTHO EXTREMITY (CUSTOM PROCEDURE TRAY) ×2 IMPLANT
PAD ARMBOARD 7.5X6 YLW CONV (MISCELLANEOUS) ×4 IMPLANT
PAD CAST 4YDX4 CTTN HI CHSV (CAST SUPPLIES) ×1 IMPLANT
PADDING CAST COTTON 4X4 STRL (CAST SUPPLIES) ×1
PADDING CAST COTTON 6X4 STRL (CAST SUPPLIES) ×2 IMPLANT
SPONGE GAUZE 4X4 12PLY (GAUZE/BANDAGES/DRESSINGS) ×2 IMPLANT
SPONGE LAP 18X18 X RAY DECT (DISPOSABLE) ×2 IMPLANT
STAPLER VISISTAT 35W (STAPLE) ×2 IMPLANT
STOCKINETTE IMPERVIOUS LG (DRAPES) ×2 IMPLANT
SUCTION FRAZIER TIP 10 FR DISP (SUCTIONS) ×2 IMPLANT
SUT ETHILON 3 0 PS 1 (SUTURE) IMPLANT
SUT PROLENE 0 CT 2 (SUTURE) ×4 IMPLANT
SUT VIC AB 0 CT1 27 (SUTURE) ×1
SUT VIC AB 0 CT1 27XBRD ANBCTR (SUTURE) ×1 IMPLANT
SUT VIC AB 1 CT1 27 (SUTURE) ×1
SUT VIC AB 1 CT1 27XBRD ANBCTR (SUTURE) ×1 IMPLANT
SUT VIC AB 2-0 CT1 27 (SUTURE) ×2
SUT VIC AB 2-0 CT1 TAPERPNT 27 (SUTURE) ×2 IMPLANT
SYR 20ML ECCENTRIC (SYRINGE) IMPLANT
SYRINGE 10CC LL (SYRINGE) ×4 IMPLANT
TOWEL OR 17X24 6PK STRL BLUE (TOWEL DISPOSABLE) ×2 IMPLANT
TOWEL OR 17X26 10 PK STRL BLUE (TOWEL DISPOSABLE) ×4 IMPLANT
TRAY FOLEY CATH 16FRSI W/METER (SET/KITS/TRAYS/PACK) ×2 IMPLANT
TUBE CONNECTING 12X1/4 (SUCTIONS) ×2 IMPLANT
WATER STERILE IRR 1000ML POUR (IV SOLUTION) ×4 IMPLANT
YANKAUER SUCT BULB TIP NO VENT (SUCTIONS) ×2 IMPLANT
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
## 2013-07-26 NOTE — Op Note (Signed)
NAME:  [name], [name]                   ACCOUNT NO.:  [id number]  MEDICAL RECORD NO.:  [id number]  LOCATION:  [facility]                         FACILITY:  [facility]  PHYSICIAN:  Doralee Albino. Carola Frost, M.D. DATE OF BIRTH:  1976/08/01  DATE OF PROCEDURE:  07/26/2013 DATE OF DISCHARGE:  07/26/2013                              OPERATIVE REPORT   PREOPERATIVE DIAGNOSIS:  POSTOPERATIVE DIAGNOSES: 1. 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 2. Retained antibiotic spacer.  POSTOPERATIVE DIAGNOSES: 1. 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 2. Retained antibiotic spacer.  PROCEDURES: 1. Nonunion repair of the left proximal tibia using INFUSE and     allograft. 2. Removal of antibiotic cement spacer.  SURGEON:  Doralee Albino. Carola Frost, M.D.  ASSISTANT:  None.  ANESTHESIA:  General.  COMPLICATIONS:  None.  TOURNIQUET:  None.  EBL:  50 mL.  DISPOSITION:  To PACU.  CONDITION:  Stable.  BRIEF SUMMARY AND INDICATION FOR PROCEDURE:  [name] is a 37 year old male who sustained a IIIC open tibial plateau and proximal tibia fracture.  He had large medial fasciotomies.  At that time, there was significant discussion regarding amputation versus salvage and the patient did opt for retention of the extremity.  He underwent ORIF with placement of an antibiotic cement spacer with the expectation being to return to the OR for eventual removal.  The patient has stabilized clinically.  He was referred for evaluation of his medial soft tissue coverage and Dr. Genelle Gather at [facility] did not feel that he needed a flap. He has not had any drainage from the medial side, but still has a small area that is yet completely epithelialized.  I discussed with him the risks and benefits of removal of the antibiotic spacer, and grafting. We also discussed iliac crest grafting, possibility of hardware breakage, need for further  surgery, and multiple others.  The patient understood these risks and did wish to proceed.  BRIEF DESCRIPTION OF PROCEDURE:  The patient was taken to the operating room.  After anesthesia was induced, he did receive vancomycin preoperatively.  He had no evidence of infection.  The old incision was remade laterally and dissection carried carefully down to the plate and cement spacer.  Soft tissue was elevated carefully off and the spacer removed.  Curettes were used to freshen all bone edges and completely explore the cavity.  At that time, it was irrigated thoroughly in pulsatile fashion.  C-arm was used to confirm complete exploration of this and definition of the bone cavity.  It was then grafted with the largest INFUSE sponge and cancellous allograft placed within.  There was a burrito construct placed in the posteromedial and posterolateral aspects of the defect, additional graft in the middle, and then more INFUSE around the anterior, medial, and anterolateral cortices.  Final images showed complete fill of the cavity and no complications.  Wound was closed in standard layered fashion with 0-Vicryl, 2-0 Vicryl, and 3- 0 nylon using horizontal vertical mattress suture.  Sterile gently compressive dressing was applied.  The patient was awakened from  anesthesia and transported to PACU in stable condition.  PROGNOSIS:  [name] will remain partial weightbearing on his left proximal tibia with gradual resumption.  We anticipate he will be up to go home today with plan to return to the office in 10-14 days for removal of his sutures.  He remains at increased risk for persistent nonunion.  I will continue to work on recovery of his ankle function and particularly his great toe and ankle extension from his previous neurologic injury.     Doralee Albino. Carola Frost, M.D.     [facility]  D:  07/26/2013  T:  07/26/2013  Job:  [id number]

## 2013-07-26 NOTE — H&P (Signed)
I have seen and examined the patient. I agree with the findings above.  I discussed with the patient the risks and benefits of surgery for removal of antibiotic spacer and bone grafting with Infuse or iliac crest, including the possibility of infection, nerve injury, vessel injury, wound breakdown, arthritis, symptomatic hardware, DVT/ PE, loss of motion, and need for further surgery among others.  We also specifically discussed the elevated risk of soft tissue breakdown that could lead to amputation.  He understood these risks and wished to proceed.  Budd Palmer, MD 07/26/2013 8:23 AM

## 2013-07-26 NOTE — Anesthesia Procedure Notes (Signed)
Procedure Name: Intubation Date/Time: 07/26/2013 8:39 AM Performed by: Whitman Hero Pre-anesthesia Checklist: Patient identified, Timeout performed, Emergency Drugs available, Suction available and Patient being monitored Patient Re-evaluated:Patient Re-evaluated prior to inductionOxygen Delivery Method: Circle system utilized Preoxygenation: Pre-oxygenation with 100% oxygen Intubation Type: IV induction Ventilation: Mask ventilation without difficulty Laryngoscope Size: Mac and 4 Grade View: Grade I Tube type: Oral Tube size: 8.0 mm Number of attempts: 1 Airway Equipment and Method: Stylet Placement Confirmation: ETT inserted through vocal cords under direct vision,  breath sounds checked- equal and bilateral and positive ETCO2 Secured at: 23 cm Tube secured with: Tape Dental Injury: Teeth and Oropharynx as per pre-operative assessment

## 2013-07-26 NOTE — Anesthesia Postprocedure Evaluation (Signed)
  Anesthesia Post-op Note  Patient: [name]  Procedure(s) Performed: Procedure(s): NONUNION REPAIR LEFT PROXIMAL TIBIA WITH BONE GRAFT/REMOVING ANTIBIOTIC SPACER (Left)  Patient Location: PACU  Anesthesia Type:General  Level of Consciousness: awake and alert   Airway and Oxygen Therapy: Patient Spontanous Breathing  Post-op Pain: moderate  Post-op Assessment: Post-op Vital signs reviewed, Patient's Cardiovascular Status Stable and Respiratory Function Stable  Post-op Vital Signs: Reviewed  Filed Vitals:   07/26/13 1145  BP: 132/86  Pulse: 92  Temp: 36.7 C  Resp: 21    Complications: No apparent anesthesia complications

## 2013-07-26 NOTE — Brief Op Note (Signed)
07/26/2013  10:24 AM  PATIENT:  [name]  37 y.o. male  PRE-OPERATIVE DIAGNOSIS:  nonunion left proximal tibia, retained antibiotic spacer  POST-OPERATIVE DIAGNOSIS:  nonunion left proximal tibia, retained antibiotic spacer  PROCEDURE:  Procedure(s): 1. NONUNION REPAIR LEFT PROXIMAL TIBIA WITH BONE GRAFT/REMOVING ANTIBIOTIC SPACER (Left) 2. Removal of antibiotic spacer  SURGEON:  Surgeon(s) and Role:    * Budd Palmer, MD - Primary  ANESTHESIA:   general  EBL:  Total I/O In: 1000 [I.V.:1000] Out: -   BLOOD ADMINISTERED:none  DRAINS: none   LOCAL MEDICATIONS USED:  NONE  SPECIMEN:  No Specimen  DISPOSITION OF SPECIMEN:  N/A  COUNTS:  YES  TOURNIQUET:  * No tourniquets in log *  DICTATION: .Other Dictation: Dictation Number [id number]  PLAN OF CARE: Outpatient  PATIENT DISPOSITION:  PACU - hemodynamically stable.   Delay start of Pharmacological VTE agent (>24hrs) due to surgical blood loss or risk of bleeding: yes

## 2013-07-26 NOTE — Anesthesia Preprocedure Evaluation (Addendum)
Anesthesia Evaluation  Patient identified by MRN, date of birth, ID band Patient awake    Reviewed: Allergy & Precautions, H&P , NPO status , Patient's Chart, lab work & pertinent test results  Airway Mallampati: I TM Distance: >3 FB Neck ROM: Full    Dental no notable dental hx. (+) Edentulous Upper and Dental Advisory Given   Pulmonary Current Smoker,  breath sounds clear to auscultation  Pulmonary exam normal       Cardiovascular negative cardio ROS  Rhythm:Regular Rate:Normal     Neuro/Psych Anxiety negative neurological ROS     GI/Hepatic GERD-  Medicated and Controlled,(+)     substance abuse  marijuana use,   Endo/Other  negative endocrine ROS  Renal/GU negative Renal ROS  negative genitourinary   Musculoskeletal   Abdominal   Peds  Hematology negative hematology ROS (+)   Anesthesia Other Findings   Reproductive/Obstetrics negative OB ROS                          Anesthesia Physical Anesthesia Plan  ASA: II  Anesthesia Plan: General   Post-op Pain Management:    Induction: Intravenous  Airway Management Planned: Oral ETT  Additional Equipment:   Intra-op Plan:   Post-operative Plan: Extubation in OR  Informed Consent: I have reviewed the patients History and Physical, chart, labs and discussed the procedure including the risks, benefits and alternatives for the proposed anesthesia with the patient or authorized representative who has indicated his/her understanding and acceptance.   Dental advisory given  Plan Discussed with: CRNA  Anesthesia Plan Comments:         Anesthesia Quick Evaluation

## 2013-07-26 NOTE — Transfer of Care (Signed)
Immediate Anesthesia Transfer of Care Note  Patient: [name]  Procedure(s) Performed: Procedure(s): NONUNION REPAIR LEFT PROXIMAL TIBIA WITH BONE GRAFT/REMOVING ANTIBIOTIC SPACER (Left)  Patient Location: PACU  Anesthesia Type:General  Level of Consciousness: awake, alert  and oriented  Airway & Oxygen Therapy: Patient Spontanous Breathing  Post-op Assessment: Report given to PACU RN and Post -op Vital signs reviewed and stable  Post vital signs: Reviewed and stable  Complications: No apparent anesthesia complications

## 2013-07-28 ENCOUNTER — Encounter (HOSPITAL_COMMUNITY): Payer: Self-pay | Admitting: Orthopedic Surgery

## 2013-08-08 NOTE — Discharge Summary (Signed)
[facility] (OTS)  Patient ID: [name] MRN: [id number] DOB/AGE: 05-21-1976 37 y.o.  Admit date: 07/26/2013 Discharge date: 07/26/2013  Admission Diagnoses: Left proximal tibia nonunion  Retained abx spacer PSA Chronic pain Nicotine dependence   Discharge Diagnoses:  Left proximal tibia nonunion  PSA Chronic pain  Nicotine dependence   Procedures Performed: 07/26/2013- Dr. Carola Frost  1. Nonunion repair of the left proximal tibia using INFUSE and     allograft. 2. Removal of antibiotic cement spacer.   Discharged Condition: good  Hospital Course:   Discharged in stable condition day of surgery from pacu   Consults: None  Significant Diagnostic Studies: none  Treatments: surgery: as above   Discharge Exam:  Discharged in stable condition form PACU  Disposition: 01-Home or Self Care  Discharge Orders   Future Appointments Provider Department Dept Phone   12/26/2013 2:00 PM [facility] [place] [facility] [address] [phone]   12/26/2013 2:30 PM [facility] [place] [facility] [address] [phone]   12/26/2013 3:00 PM Carma Lair Nickel, NP [facility] [phone]   Future Orders Complete By Expires   Call MD / Call 911  As directed    Comments:     If you experience chest pain or shortness of breath, CALL 911 and be transported to the hospital emergency room.  If you develope a fever above 101 F, pus (white drainage) or increased drainage or redness at the wound, or calf pain, call your surgeon's office.   Constipation Prevention  As directed    Comments:     Drink plenty of fluids.  Prune juice may be helpful.  You may use a stool softener, such as Colace (over the counter) 100 mg twice a day.  Use MiraLax (over the counter) for constipation as needed.   Diet general  As directed    Discharge instructions  As directed    Comments:     [facility] Discharge  Instructions   General Discharge Instructions  WEIGHT BEARING STATUS: nonweightbearing Left leg  RANGE OF MOTION/ACTIVITY:range of motion as tolerated L knee and ankle   Diet: as you were eating previously.  Can use over the counter stool softeners and bowel preparations, such as Miralax, to help with bowel movements.  Narcotics can be constipating.  Be sure to drink plenty of fluids  STOP SMOKING OR USING NICOTINE PRODUCTS!!!!  As discussed nicotine severely impairs your body's ability to heal surgical and traumatic wounds but also impairs bone healing.  Wounds and bone heal by forming microscopic blood vessels (angiogenesis) and nicotine is a vasoconstrictor (essentially, shrinks blood vessels).  Therefore, if vasoconstriction occurs to these microscopic blood vessels they essentially disappear and are unable to deliver necessary nutrients to the healing tissue.  This is one modifiable factor that you can do to dramatically increase your chances of healing your injury.    (This means no smoking, no nicotine gum, patches, etc)  DO NOT USE NONSTEROIDAL ANTI-INFLAMMATORY DRUGS (NSAID'S)  Using products such as Advil (ibuprofen), Aleve (naproxen), Motrin (ibuprofen) for additional pain control during fracture healing can delay and/or prevent the healing response.  If you would like to take over the counter (OTC) medication, Tylenol (acetaminophen) is ok.  However, some narcotic medications that are given for pain control contain acetaminophen as well. Therefore, you should not exceed more than 4000 mg of tylenol in a day if you do not have liver disease.  Also note that there are may OTC  medicines, such as cold medicines and allergy medicines that my contain tylenol as well.  If you have any questions about medications and/or interactions please ask your doctor/PA or your pharmacist.   PAIN MEDICATION USE AND EXPECTATIONS  You have likely been given narcotic medications to help control your pain.   After a traumatic event that results in an fracture (broken bone) with or without surgery, it is ok to use narcotic pain medications to help control one's pain.  We understand that everyone responds to pain differently and each individual patient will be evaluated on a regular basis for the continued need for narcotic medications. Ideally, narcotic medication use should last no more than 6-8 weeks (coinciding with fracture healing).   As a patient it is your responsibility as well to monitor narcotic medication use and report the amount and frequency you use these medications when you come to your office visit.   We would also advise that if you are using narcotic medications, you should take a dose prior to therapy to maximize you participation.  IF YOU ARE ON NARCOTIC MEDICATIONS IT IS NOT PERMISSIBLE TO OPERATE A MOTOR VEHICLE (MOTORCYCLE/CAR/TRUCK/MOPED) OR HEAVY MACHINERY DO NOT MIX NARCOTICS WITH OTHER CNS (CENTRAL NERVOUS SYSTEM) DEPRESSANTS SUCH AS ALCOHOL       ICE AND ELEVATE INJURED/OPERATIVE EXTREMITY  Using ice and elevating the injured extremity above your heart can help with swelling and pain control.  Icing in a pulsatile fashion, such as 20 minutes on and 20 minutes off, can be followed.    Do not place ice directly on skin. Make sure there is a barrier between to skin and the ice pack.    Using frozen items such as frozen peas works well as the conform nicely to the are that needs to be iced.  USE AN ACE WRAP OR TED HOSE FOR SWELLING CONTROL  In addition to icing and elevation, Ace wraps or TED hose are used to help limit and resolve swelling.  It is recommended to use Ace wraps or TED hose until you are informed to stop.    When using Ace Wraps start the wrapping distally (farthest away from the body) and wrap proximally (closer to the body)   Example: If you had surgery on your leg or thing and you do not have a splint on, start the ace wrap at the toes and work your way up to  the thigh        If you had surgery on your upper extremity and do not have a splint on, start the ace wrap at your fingers and work your way up to the upper arm  IF YOU ARE IN A SPLINT OR CAST DO NOT REMOVE IT FOR ANY REASON   If your splint gets wet for any reason please contact the office immediately. You may shower in your splint or cast as long as you keep it dry.  This can be done by wrapping in a cast cover or garbage back (or similar)  Do Not stick any thing down your splint or cast such as pencils, money, or hangers to try and scratch yourself with.  If you feel itchy take benadryl as prescribed on the bottle for itching  IF YOU ARE IN A CAM BOOT (BLACK BOOT)  You may remove boot periodically. Perform daily dressing changes as noted below.  Wash the liner of the boot regularly and wear a sock when wearing the boot. It is recommended that you sleep in the  boot until told otherwise  CALL THE OFFICE WITH ANY QUESTIONS OR CONCERTS: [phone]     Discharge Pin Site Instructions  Dress pins daily with Kerlix roll starting on POD 2. Wrap the Kerlix so that it tamps the skin down around the pin-skin interface to prevent/limit motion of the skin relative to the pin.  (Pin-skin motion is the primary cause of pain and infection related to external fixator pin sites).  Remove any crust or coagulum that may obstruct drainage with a saline moistened gauze or soap and water.  After POD 3, if there is no discernable drainage on the pin site dressing, the interval for change can by increased to every other day.  You may shower with the fixator, cleaning all pin sites gently with soap and water.  If you have a surgical wound this needs to be completely dry and without drainage before showering.  The extremity can be lifted by the fixator to facilitate wound care and transfers.  Notify the office/Doctor if you experience increasing drainage, redness, or pain from a pin site, or if you notice  purulent (thick, snot-like) drainage.  Discharge Wound Care Instructions  Do NOT apply any ointments, solutions or lotions to pin sites or surgical wounds.  These prevent needed drainage and even though solutions like hydrogen peroxide kill bacteria, they also damage cells lining the pin sites that help fight infection.  Applying lotions or ointments can keep the wounds moist and can cause them to breakdown and open up as well. This can increase the risk for infection. When in doubt call the office.  Surgical incisions should be dressed daily.  If any drainage is noted, use one layer of adaptic, then gauze, Kerlix, and an ace wrap.  Once the incision is completely dry and without drainage, it may be left open to air out.  Showering may begin 36-48 hours later.  Cleaning gently with soap and water.  Traumatic wounds should be dressed daily as well.    One layer of adaptic, gauze, Kerlix, then ace wrap.  The adaptic can be discontinued once the draining has ceased    If you have a wet to dry dressing: wet the gauze with saline the squeeze as much saline out so the gauze is moist (not soaking wet), place moistened gauze over wound, then place a dry gauze over the moist one, followed by Kerlix wrap, then ace wrap.   Driving restrictions  As directed    Comments:     No driving   Increase activity slowly as tolerated  As directed    Non weight bearing  As directed    Questions:     Laterality:     Extremity:         Medication List    STOP taking these medications       esomeprazole 40 MG capsule  Commonly known as:  NEXIUM      TAKE these medications       amitriptyline 50 MG tablet  Commonly known as:  ELAVIL  Take 1 tablet by mouth at bedtime.     carisoprodol 350 MG tablet  Commonly known as:  SOMA  Take 1 tablet (350 mg total) by mouth every 8 (eight) hours as needed for muscle spasms.     ciprofloxacin 500 MG tablet  Commonly known as:  CIPRO  Take 500 mg by mouth 2  (two) times daily.     diazepam 10 MG tablet  Commonly known as:  VALIUM  Take 10 mg by mouth 2 (two) times daily.     doxepin 150 MG capsule  Commonly known as:  SINEQUAN  Take 150 mg by mouth 2 (two) times daily.     DSS 100 MG Caps  Take 100 mg by mouth 2 (two) times daily.     oxyCODONE 5 MG immediate release tablet  Commonly known as:  ROXICODONE  Take 1 tablet (5 mg total) by mouth every 4 (four) hours as needed for pain (breakthrough pain only).     oxyCODONE-acetaminophen 10-325 MG per tablet  Commonly known as:  PERCOCET  Take 1-2 tablets by mouth every 4 (four) hours as needed for pain. For pain     pantoprazole 20 MG tablet  Commonly known as:  PROTONIX  Take 1 tablet (20 mg total) by mouth daily.     pregabalin 300 MG capsule  Commonly known as:  LYRICA  Take 300 mg by mouth 2 (two) times daily.           Follow-up Information   Schedule an appointment as soon as possible for a visit with Budd Palmer, MD. (call for appointment)    Specialty:  Orthopedic Surgery   Contact information:   [address] [place] [address] [phone]        Signed:  Mearl Latin, PA-C [facility] [phone] (P) 08/08/2013, 7:54 AM

## 2013-09-05 ENCOUNTER — Encounter (HOSPITAL_COMMUNITY): Payer: Self-pay | Admitting: Emergency Medicine

## 2013-09-05 ENCOUNTER — Inpatient Hospital Stay (HOSPITAL_COMMUNITY)
Admission: AD | Admit: 2013-09-05 | Discharge: 2013-09-08 | DRG: 897 | Disposition: A | Discharge status: Home / Self Care | Payer: Medicare Other | Source: Intra-hospital | Attending: Psychiatry | Admitting: Psychiatry

## 2013-09-05 ENCOUNTER — Emergency Department (EMERGENCY_DEPARTMENT_HOSPITAL)
Admission: EM | Admit: 2013-09-05 | Discharge: 2013-09-05 | Disposition: A | Discharge status: Other Acute Inpatient Hospital | Payer: Medicare Other | Source: Home / Self Care | Attending: Emergency Medicine | Admitting: Emergency Medicine

## 2013-09-05 DIAGNOSIS — F1020 Alcohol dependence, uncomplicated: Principal | ICD-10-CM | POA: Diagnosis present

## 2013-09-05 DIAGNOSIS — G8929 Other chronic pain: Secondary | ICD-10-CM | POA: Diagnosis present

## 2013-09-05 DIAGNOSIS — F411 Generalized anxiety disorder: Secondary | ICD-10-CM | POA: Diagnosis present

## 2013-09-05 DIAGNOSIS — K219 Gastro-esophageal reflux disease without esophagitis: Secondary | ICD-10-CM | POA: Diagnosis present

## 2013-09-05 DIAGNOSIS — F321 Major depressive disorder, single episode, moderate: Secondary | ICD-10-CM | POA: Diagnosis present

## 2013-09-05 DIAGNOSIS — F1994 Other psychoactive substance use, unspecified with psychoactive substance-induced mood disorder: Secondary | ICD-10-CM

## 2013-09-05 DIAGNOSIS — F329 Major depressive disorder, single episode, unspecified: Secondary | ICD-10-CM

## 2013-09-05 DIAGNOSIS — M549 Dorsalgia, unspecified: Secondary | ICD-10-CM | POA: Diagnosis present

## 2013-09-05 DIAGNOSIS — M25519 Pain in unspecified shoulder: Secondary | ICD-10-CM | POA: Diagnosis present

## 2013-09-05 DIAGNOSIS — F1910 Other psychoactive substance abuse, uncomplicated: Secondary | ICD-10-CM

## 2013-09-05 DIAGNOSIS — Z79899 Other long term (current) drug therapy: Secondary | ICD-10-CM

## 2013-09-05 DIAGNOSIS — F332 Major depressive disorder, recurrent severe without psychotic features: Secondary | ICD-10-CM

## 2013-09-05 LAB — SALICYLATE LEVEL: Salicylate Lvl: <2 mg/dL — ABNORMAL LOW (ref 2.8–20.0)

## 2013-09-05 LAB — COMPREHENSIVE METABOLIC PANEL WITH GFR
AST: 12 U/L (ref 0–37)
Calcium: 9.2 mg/dL (ref 8.4–10.5)
Creatinine, Ser: 1.04 mg/dL (ref 0.50–1.35)
GFR calc Af Amer: >90 mL/min (ref >90)
GFR calc non Af Amer: 90 mL/min — ABNORMAL LOW (ref >90)
Glucose, Bld: 104 mg/dL — ABNORMAL HIGH (ref 70–99)
Sodium: 141 meq/L (ref 135–145)

## 2013-09-05 LAB — CBC
HCT: 42.4 % (ref 39.0–52.0)
Hemoglobin: 14.6 g/dL (ref 13.0–17.0)
MCH: 29.6 pg (ref 26.0–34.0)
MCHC: 34.4 g/dL (ref 30.0–36.0)
MCV: 86 fL (ref 78.0–100.0)
Platelets: 324 K/uL (ref 150–400)
RBC: 4.93 MIL/uL (ref 4.22–5.81)
RDW: 17.2 % — ABNORMAL HIGH (ref 11.5–15.5)
WBC: 16.2 10*3/uL — ABNORMAL HIGH (ref 4.0–10.5)

## 2013-09-05 LAB — ACETAMINOPHEN LEVEL: Acetaminophen (Tylenol), Serum: <15 ug/mL (ref 10–30)

## 2013-09-05 LAB — COMPREHENSIVE METABOLIC PANEL
ALT: 17 U/L (ref 0–53)
Albumin: 3.8 g/dL (ref 3.5–5.2)
Alkaline Phosphatase: 147 U/L — ABNORMAL HIGH (ref 39–117)
BUN: 15 mg/dL (ref 6–23)
CO2: 25 mEq/L (ref 19–32)
Chloride: 107 mEq/L (ref 96–112)
Potassium: 4 mEq/L (ref 3.5–5.1)
Total Bilirubin: 0.1 mg/dL — ABNORMAL LOW (ref 0.3–1.2)
Total Protein: 7.7 g/dL (ref 6.0–8.3)

## 2013-09-05 LAB — RAPID URINE DRUG SCREEN, HOSP PERFORMED
Amphetamines: NOT DETECTED
Barbiturates: NOT DETECTED
Benzodiazepines: POSITIVE — AB
Cocaine: POSITIVE — AB
Opiates: NOT DETECTED
Tetrahydrocannabinol: POSITIVE — AB

## 2013-09-05 LAB — ETHANOL: Alcohol, Ethyl (B): <11 mg/dL (ref 0–11)

## 2013-09-05 MED ORDER — VITAMIN B-1 100 MG PO TABS
100.0000 mg | ORAL_TABLET | Freq: Every day | ORAL | Status: DC
  Administered 2013-09-06 – 2013-09-08 (×3): 100 mg via ORAL
  Filled 2013-09-05 (×6): qty 1

## 2013-09-05 MED ORDER — ONDANSETRON HCL 4 MG PO TABS: 4.0000 mg | ORAL_TABLET | Freq: Three times a day (TID) | ORAL | Status: DC | PRN

## 2013-09-05 MED ORDER — THIAMINE HCL 100 MG/ML IJ SOLN: 100.0000 mg | Freq: Every day | INTRAMUSCULAR | Status: DC

## 2013-09-05 MED ORDER — MAGNESIUM HYDROXIDE 400 MG/5ML PO SUSP: 30.0000 mL | Freq: Every day | ORAL | Status: DC | PRN

## 2013-09-05 MED ORDER — FOLIC ACID 1 MG PO TABS
1.0000 mg | ORAL_TABLET | Freq: Once | ORAL | Status: AC
  Administered 2013-09-05: 1 mg via ORAL
  Filled 2013-09-05: qty 1

## 2013-09-05 MED ORDER — LORAZEPAM 1 MG PO TABS: 0.0000 mg | ORAL_TABLET | Freq: Four times a day (QID) | ORAL | Status: DC

## 2013-09-05 MED ORDER — IBUPROFEN 600 MG PO TABS: 600.0000 mg | ORAL_TABLET | Freq: Three times a day (TID) | ORAL | Status: DC | PRN

## 2013-09-05 MED ORDER — AMITRIPTYLINE HCL 25 MG PO TABS: 25.0000 mg | ORAL_TABLET | Freq: Every day | ORAL | Status: DC

## 2013-09-05 MED ORDER — VITAMIN B-1 100 MG PO TABS: 100.0000 mg | ORAL_TABLET | Freq: Every day | ORAL | Status: DC

## 2013-09-05 MED ORDER — AMITRIPTYLINE HCL 50 MG PO TABS
50.0000 mg | ORAL_TABLET | Freq: Every day | ORAL | Status: DC
  Administered 2013-09-05 – 2013-09-07 (×3): 50 mg via ORAL
  Filled 2013-09-05: qty 1
  Filled 2013-09-05: qty 2
  Filled 2013-09-05: qty 1
  Filled 2013-09-05: qty 4
  Filled 2013-09-05 (×2): qty 1
  Filled 2013-09-05: qty 4

## 2013-09-05 MED ORDER — HYDROXYZINE HCL 50 MG PO TABS
50.0000 mg | ORAL_TABLET | Freq: Every evening | ORAL | Status: DC | PRN
  Administered 2013-09-06 (×2): 50 mg via ORAL
  Filled 2013-09-05: qty 4
  Filled 2013-09-05 (×2): qty 1

## 2013-09-05 MED ORDER — METHOCARBAMOL 500 MG PO TABS
500.0000 mg | ORAL_TABLET | Freq: Four times a day (QID) | ORAL | Status: DC | PRN
  Administered 2013-09-05 – 2013-09-07 (×3): 500 mg via ORAL
  Filled 2013-09-05 (×3): qty 1

## 2013-09-05 MED ORDER — LORAZEPAM 1 MG PO TABS
0.0000 mg | ORAL_TABLET | Freq: Four times a day (QID) | ORAL | Status: AC
  Administered 2013-09-05: 2 mg via ORAL
  Administered 2013-09-06 – 2013-09-07 (×6): 1 mg via ORAL
  Filled 2013-09-05 (×2): qty 1
  Filled 2013-09-05: qty 2
  Filled 2013-09-05 (×4): qty 1

## 2013-09-05 MED ORDER — ALUM & MAG HYDROXIDE-SIMETH 200-200-20 MG/5ML PO SUSP: 30.0000 mL | ORAL | Status: DC | PRN

## 2013-09-05 MED ORDER — NICOTINE 21 MG/24HR TD PT24
21.0000 mg | MEDICATED_PATCH | Freq: Once | TRANSDERMAL | Status: DC
  Administered 2013-09-05: 21 mg via TRANSDERMAL
  Filled 2013-09-05: qty 1

## 2013-09-05 MED ORDER — LORAZEPAM 1 MG PO TABS: 0.0000 mg | ORAL_TABLET | Freq: Two times a day (BID) | ORAL | Status: DC

## 2013-09-05 MED ORDER — IBUPROFEN 200 MG PO TABS: 600.0000 mg | ORAL_TABLET | Freq: Three times a day (TID) | ORAL | Status: DC | PRN

## 2013-09-05 MED ORDER — LORAZEPAM 1 MG PO TABS
0.0000 mg | ORAL_TABLET | Freq: Two times a day (BID) | ORAL | Status: DC
  Administered 2013-09-07 – 2013-09-08 (×2): 1 mg via ORAL
  Filled 2013-09-05 (×2): qty 1

## 2013-09-05 MED ORDER — ACETAMINOPHEN 325 MG PO TABS: 650.0000 mg | ORAL_TABLET | Freq: Four times a day (QID) | ORAL | Status: DC | PRN

## 2013-09-05 MED ORDER — ACETAMINOPHEN 325 MG PO TABS: 650.0000 mg | ORAL_TABLET | ORAL | Status: DC | PRN

## 2013-09-05 NOTE — ED Notes (Signed)
[place], [facility] notified pt is sleeping, unable to arouse, pt is on cardiac monitoring, snoring, no respiratory distress noted
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
## 2013-09-05 NOTE — Consult Note (Signed)
Pt was interviewed with NP. Agree with above assessment and plan. 

## 2013-09-05 NOTE — ED Notes (Signed)
Pt arrived to the ED with a need for medical clearance.  Pt states he is addicted to any drug he can get his hands on.  Pt has had orthopedic surgery on his left leg which requires a brace.  Pt has prescriptions for pain medications which he states he abuses.  Pt appears intoxicated at this time.
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
## 2013-09-05 NOTE — BH Assessment (Signed)
Called [facility] to arrange tele-assessment. Spoke with Domingo Mend Tai, RN who said Pt is very drowsy and currently unable to participate in assessment. She will call TTS at [phone] when Pt is alert enough to participate in tele-assessment.  Harlin Rain Ria Comment, [facility] Triage Specialist
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
## 2013-09-05 NOTE — Progress Notes (Signed)
Pt is newly admitted pt that is reporting withdrawal symptoms of the "shakes", feeling closed in, agitation, and body aches. Pt also presents with a moderate amount of anxiety. Pt has been started on 2 mg of Ativan. Writer calculated an initial CIWA of 11 for this pt. Pt is currently in attendance for group this evening. Pt is appearing to be adjusting well to the unit at this time. A: Writer administered scheduled and prn medications to pt. Continued support and availability as needed was extended to this pt. Staff continue to monitor pt with q24min checks.  R: No adverse drug reactions noted. Pt receptive to treatment. Pt remains safe at this time.

## 2013-09-05 NOTE — BH Assessment (Signed)
Per notes by Donnita Falls, RN patient will be assessed by TTS approx. 9am. Writer able to complete this patient's TTS earlier than 0900, therefore; contacted patient's nurse -Dianne to see if patient is alert and oriented enough for an assessment. Per patient's nurse, patient is not alert or oriented enough for an assessment. Graciella Belton will call [phone] when patient becomes alert and oriented.

## 2013-09-05 NOTE — ED Notes (Addendum)
Unable to conduct a CIWA at this time d/t pt being asleep.

## 2013-09-05 NOTE — ED Notes (Signed)
Charge rn talked to [facility], TTS will assess pt around 0900, pt sleeping/drowsy today

## 2013-09-05 NOTE — Progress Notes (Signed)
Writer consulted with the NP Julieanne Cotton) regarding the patient meeting criteria for inpatient hospitalization for a 300 Hall Bed at [facility].  Writer was informed by the [facility] Minerva Areola) that the patient has been accepted to [facility].  Incoming staff will complete support paperwork.

## 2013-09-05 NOTE — ED Notes (Signed)
Pt is now more alert and awake, eating and drinking. Pt requesting something sweet d/t his addiction to opiates. Pt states took 5 Soma's prior to arrival here. Pt is requesting detox. Pt denies SI/HI

## 2013-09-05 NOTE — BH Assessment (Signed)
Received call for tele-assessment. Spoke with Dr. Quita Skye who states Pt has a history of chronic pain and is abusing multiple substances and alcohol. No SI or HI. Pt requesting substance abuse treatment. Tele-assessment will be initiated.  Harlin Rain Ria Comment, [facility] Triage Specialist

## 2013-09-06 ENCOUNTER — Encounter (HOSPITAL_COMMUNITY): Payer: Self-pay

## 2013-09-06 DIAGNOSIS — F1020 Alcohol dependence, uncomplicated: Principal | ICD-10-CM

## 2013-09-06 DIAGNOSIS — F1120 Opioid dependence, uncomplicated: Secondary | ICD-10-CM

## 2013-09-06 DIAGNOSIS — F1220 Cannabis dependence, uncomplicated: Secondary | ICD-10-CM

## 2013-09-06 MED ORDER — DOXEPIN HCL 50 MG PO CAPS
150.0000 mg | ORAL_CAPSULE | Freq: Every day | ORAL | Status: DC
  Administered 2013-09-06 – 2013-09-07 (×2): 150 mg via ORAL
  Filled 2013-09-06: qty 12
  Filled 2013-09-06: qty 6
  Filled 2013-09-06: qty 2
  Filled 2013-09-06: qty 12
  Filled 2013-09-06 (×2): qty 2

## 2013-09-06 MED ORDER — PANTOPRAZOLE SODIUM 40 MG PO TBEC
40.0000 mg | DELAYED_RELEASE_TABLET | Freq: Every day | ORAL | Status: DC
  Administered 2013-09-06 – 2013-09-08 (×3): 40 mg via ORAL
  Filled 2013-09-06 (×5): qty 1

## 2013-09-06 MED ORDER — BACLOFEN 10 MG PO TABS
10.0000 mg | ORAL_TABLET | Freq: Three times a day (TID) | ORAL | Status: DC
  Administered 2013-09-06 – 2013-09-08 (×5): 10 mg via ORAL
  Filled 2013-09-06: qty 12
  Filled 2013-09-06 (×2): qty 1
  Filled 2013-09-06: qty 12
  Filled 2013-09-06: qty 1
  Filled 2013-09-06 (×2): qty 12
  Filled 2013-09-06 (×2): qty 1
  Filled 2013-09-06 (×2): qty 12
  Filled 2013-09-06 (×3): qty 1

## 2013-09-06 MED ORDER — DOCUSATE SODIUM 100 MG PO CAPS
100.0000 mg | ORAL_CAPSULE | Freq: Two times a day (BID) | ORAL | Status: DC
Start: 2013-09-06 — End: 2013-09-08
  Administered 2013-09-06 – 2013-09-08 (×4): 100 mg via ORAL
  Filled 2013-09-06 (×8): qty 1

## 2013-09-06 MED ORDER — GABAPENTIN 300 MG PO CAPS
300.0000 mg | ORAL_CAPSULE | Freq: Three times a day (TID) | ORAL | Status: DC
  Administered 2013-09-06 – 2013-09-08 (×5): 300 mg via ORAL
  Filled 2013-09-06: qty 12
  Filled 2013-09-06 (×2): qty 1
  Filled 2013-09-06: qty 12
  Filled 2013-09-06 (×2): qty 1
  Filled 2013-09-06: qty 12
  Filled 2013-09-06: qty 1
  Filled 2013-09-06 (×2): qty 12
  Filled 2013-09-06 (×3): qty 1
  Filled 2013-09-06: qty 12
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
## 2013-09-06 NOTE — Clinical Social Work Note (Signed)
Pt and CSW left message for P.O. Loni Dolly at [phone] asking her to call back regarding pt's acceptance (tentatively) into [facility] for Dec. 17th. Pt also called to request someone come out to the hospital to remove his inactive ankle device.   [employer], LCSWA  09/06/2013 3:03 PM

## 2013-09-06 NOTE — Tx Team (Signed)
Initial Interdisciplinary Treatment Plan  PATIENT STRENGTHS: (choose at least two) Active sense of humor General fund of knowledge Motivation for treatment/growth  PATIENT STRESSORS: Financial difficulties Health problems Legal issue Substance abuse   PROBLEM LIST: Problem List/Patient Goals Date to be addressed Date deferred Reason deferred Estimated date of resolution  ETOH abuse 12/1     Anxiety 12/1                                                DISCHARGE CRITERIA:  Medical problems require only outpatient monitoring Motivation to continue treatment in a less acute level of care Verbal commitment to aftercare and medication compliance Withdrawal symptoms are absent or subacute and managed without 24-hour nursing intervention  PRELIMINARY DISCHARGE PLAN: Attend 12-step recovery group Outpatient therapy Rehab Placement  PATIENT/FAMIILY INVOLVEMENT: This treatment plan has been presented to and reviewed with the patient, [name].  The patient and family have been given the opportunity to ask questions and make suggestions.  Keyli Duross A 09/06/2013, 4:46 AM
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
## 2013-09-06 NOTE — Progress Notes (Signed)
Adult Psychoeducational Group Note  Date:  09/06/2013 Time:  11:44 AM  Group Topic/Focus:  Recovery Goals:   The focus of this group is to identify appropriate goals for recovery and establish a plan to achieve them.  Participation Level:  Did Not Attend  Participation Quality:    Affect:    Cognitive:    Insight:   Engagement in Group:    Modes of Intervention:    Additional Comments:  Pt was with the doctor  Gracy Racer 09/06/2013, 11:44 AM

## 2013-09-06 NOTE — Progress Notes (Signed)
Patient ID: [name], male   DOB: [birth year], 37 y.o.   MRN: [id number]  D: Patient pleasant and cooperative with staff. Interacting well with peers. A: Medications, encourage group participation and continued peer interaction. R: Patient participated in group session, is compliant with medications. Pt denies SI at this time.

## 2013-09-06 NOTE — Progress Notes (Signed)
Pt reports he may have a bed at [facility], but will not be able to go until Dec 17.  He was tearful as he explained that he does not have anywhere to go until then.  He said he has called family members, but no one will allow him to stay with them until then.  Encouraged pt to speak with his CM to work out a plan for shelter until then.  Pt denies SI/HI/AV at this time.  Pt says his withdrawal symptoms are minimal.  He has not c/o of pain this evening.  He has been med compliant on the unit.  He says he has been going to groups and participating.  Support and encouragement offered.  Safety maintained with q15 minute checks.

## 2013-09-06 NOTE — [facility] Group Notes (Signed)
[facility] Group Notes:  (Nursing/MHT/Case Management/Adjunct)  Date:  09/06/2013  Time:  9:27 AM  Type of Therapy:  Psychoeducational Skills  Participation Level:  Active  Participation Quality:  Appropriate and Attentive  Affect:  Appropriate  Cognitive:  Alert, Appropriate and Oriented  Insight:  Appropriate and Good  Engagement in Group:  Engaged  Modes of Intervention:  Discussion, Education and Support  Summary of Progress/Problems: Pt states his goal is to speak with his probation officer and "get everything straight"  Renaee Munda 09/06/2013, 9:27 AM
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
## 2013-09-06 NOTE — Care Management Utilization Note (Signed)
Per State Regulation 482.30  The chart was reviewed for necessity with respect to the patient's Admission/ Duration of stay.Admission 09/05/13  Next Review Date:09/08/13.  Lacinda Axon, RN, BSN

## 2013-09-06 NOTE — BHH Group Notes (Signed)
[facility] LCSW Group Therapy  09/06/2013 1:35 PM  Type of Therapy:  Group Therapy  Participation Level:  Did Not Attend  [name] 09/06/2013, 1:35 PM
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
## 2013-09-06 NOTE — Progress Notes (Signed)
Recreation Therapy Notes  Animal-Assisted Activity/Therapy (AAA/T) Program Checklist/Progress Notes Patient Eligibility Criteria Checklist & Daily Group note for Rec Tx Intervention  Date: 12.02.2014 Time: 2:30pm Location: [address] Dayroom   AAA/T Program Assumption of Risk Form signed by Patient/ or Parent Legal Guardian yes  Patient is free of allergies or sever asthma  yes  Patient reports no fear of animals yes  Patient reports no history of cruelty to animals yes.   Patient understands his/her participation is voluntary yes.  Patient washes hands before animal contact yes  Patient washes hands after animal contact yes  Behavioral Response: Engaged, Appropriate   Education: Charity fundraiser, Appropriate Animal Interaction   Education Outcome: Acknowledges understanding   Clinical Observations/Feedback: Patient interacted appropriately with dog team and LRT.    Marykay Lex Trust Crago, LRT/CTRS  Jearl Klinefelter 09/06/2013 4:25 PM

## 2013-09-06 NOTE — BHH Suicide Risk Assessment (Signed)
BHH INPATIENT:  Family/Significant Other Suicide Prevention Education  Suicide Prevention Education:  Education Completed; Kathlyn Sacramento (pt's girlfriend) [phone] has been identified by the patient as the family member/significant other with whom the patient will be residing, and identified as the person(s) who will aid the patient in the event of a mental health crisis (suicidal ideations/suicide attempt).  With written consent from the patient, the family member/significant other has been provided the following suicide prevention education, prior to the and/or following the discharge of the patient.  The suicide prevention education provided includes the following:  Suicide risk factors  Suicide prevention and interventions  National Suicide Hotline telephone number  [facility] assessment telephone number  [facility] Emergency Assistance 911  [facility] and/or Residential Mobile Crisis Unit telephone number  Request made of family/significant other to:  Remove weapons (e.g., guns, rifles, knives), all items previously/currently identified as safety concern.    Remove drugs/medications (over-the-counter, prescriptions, illicit drugs), all items previously/currently identified as a safety concern.  The family member/significant other verbalizes understanding of the suicide prevention education information provided.  The family member/significant other agrees to remove the items of safety concern listed above.   Smart, Tyffani Foglesong LCSWA 09/06/2013, 11:41 AM

## 2013-09-07 DIAGNOSIS — F1994 Other psychoactive substance use, unspecified with psychoactive substance-induced mood disorder: Secondary | ICD-10-CM

## 2013-09-07 DIAGNOSIS — F321 Major depressive disorder, single episode, moderate: Secondary | ICD-10-CM

## 2013-09-07 MED ORDER — NICOTINE 21 MG/24HR TD PT24
21.0000 mg | MEDICATED_PATCH | Freq: Every day | TRANSDERMAL | Status: DC
  Administered 2013-09-07 – 2013-09-08 (×2): 21 mg via TRANSDERMAL
  Filled 2013-09-07 (×4): qty 1

## 2013-09-07 NOTE — Progress Notes (Signed)
Patient ID: [name], male   DOB: 08-08-1976, 37 y.o.   MRN: [id number] He has been up and to part of the groups interacting with peers and staff. He has c/o muscle spasams left leg once today and received a prn that was effective. He did shower today.

## 2013-09-07 NOTE — Progress Notes (Signed)
Adult Psychoeducational Group Note  Date:  09/07/2013 Time:  9:54 PM  Group Topic/Focus:  NA MEETING  Participation Level:  Active  Participation Quality:  Appropriate  Affect:  Appropriate  Cognitive:  Appropriate  Insight: Appropriate  Engagement in Group:  Engaged  Modes of Intervention:  Support  Additional Comments:  PT was active and participated during NA   Dyani Babel 09/07/2013, 9:54 PM

## 2013-09-07 NOTE — BHH Group Notes (Signed)
[facility] LCSW Group Therapy  09/07/2013 3:26 PM  Type of Therapy:  Group Therapy  Participation Level:  Did Not Attend  Smart, Shivaun Bilello LCSWA 09/07/2013, 3:26 PM  
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
## 2013-09-07 NOTE — BHH Group Notes (Signed)
[facility] LCSW Aftercare Discharge Planning Group Note   09/07/2013 9:36 AM  Participation Quality:  Appropriate   Mood/Affect:  Depressed/Flat  Depression Rating:  9  Anxiety Rating:  9  Thoughts of Suicide:  No Will you contract for safety?   NA  Current AVH:  No  Plan for Discharge/Comments:  Pt stated that his PO is coming at 4pm today to take off his ankle monitor. Pt has tentative admission for 12/17 at [facility] and must call them at 12/10 [name]) to talk about specifics. Pt stated that he is homeless for the time being as his family refuses to take him back. CSW encouraged pt to talk to his PO about this when she comes this afternoon to find out if he can go to a shelter for this time period.    Transportation Means: unknown at this time.   Supports: girlfriend/family   [name] LCSWA 09/07/2013 9:58 AM

## 2013-09-08 MED ORDER — DOXEPIN HCL 150 MG PO CAPS: 150.0000 mg | ORAL_CAPSULE | Freq: Two times a day (BID) | ORAL | Status: DC

## 2013-09-08 MED ORDER — GABAPENTIN 300 MG PO CAPS: 300.0000 mg | ORAL_CAPSULE | Freq: Three times a day (TID) | ORAL | Status: DC

## 2013-09-08 MED ORDER — DOXEPIN HCL 150 MG PO CAPS: 150.0000 mg | ORAL_CAPSULE | Freq: Every day | ORAL | Status: DC

## 2013-09-08 MED ORDER — DSS 100 MG PO CAPS: 100.0000 mg | ORAL_CAPSULE | Freq: Two times a day (BID) | ORAL | Status: DC

## 2013-09-08 MED ORDER — BACLOFEN 10 MG PO TABS: 10.0000 mg | ORAL_TABLET | Freq: Three times a day (TID) | ORAL | Status: DC

## 2013-09-08 MED ORDER — HYDROXYZINE HCL 50 MG PO TABS: 50.0000 mg | ORAL_TABLET | Freq: Every evening | ORAL | Status: DC | PRN

## 2013-09-08 MED ORDER — AMITRIPTYLINE HCL 50 MG PO TABS: 50.0000 mg | ORAL_TABLET | Freq: Every day | ORAL | Status: DC

## 2013-09-08 MED ORDER — ESOMEPRAZOLE MAGNESIUM 40 MG PO CPDR: 40.0000 mg | DELAYED_RELEASE_CAPSULE | Freq: Every day | ORAL | Status: DC
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
## 2013-09-08 NOTE — Progress Notes (Signed)
D: Patient denies SI/HI/AVH. Patient has a depressed mood and anxious affect.  Pt. Reports that his anxiety is 10/10, however although he does appear somewhat anxious a 10/10 rating is incongruent with his interactions with others.  A: Patient given emotional support from RN. Patient encouraged to come to staff with concerns and/or questions. Patient's medication routine continued. Patient's orders and plan of care reviewed.   R: Patient remains appropriate and cooperative. Will continue to monitor patient q15 minutes for safety.

## 2013-09-08 NOTE — Progress Notes (Signed)
Poly sub abuse He is reporting that he hears voices unable to state what he hears. He denies any S/H ideation and A/V/H.

## 2013-09-08 NOTE — Progress Notes (Signed)
[facility] Group Notes:  (Nursing/MHT/Case Management/Adjunct)  Date:  09/08/2013  Time:  9:42 AM  Type of Therapy:  Nurse Education  Participation Level:  Active  Participation Quality:  Appropriate and Attentive  Affect:  Flat  Cognitive:  Alert  Insight:  Good  Engagement in Group:  Engaged  Modes of Intervention:  Activity, Discussion, Education, Exploration, Orientation, Socialization and Support  Summary of Progress/Problems: Pt plans on waking up with a positive attitude and staying positive during the day so he can receive positivity back. Pt made a daily goal of letting go and let God and to release control.  Cathlean Cower 09/08/2013, 9:42 AM

## 2013-09-08 NOTE — Progress Notes (Signed)
[facility] Adult Case Management Discharge Plan :  Will you be returning to the same living situation after discharge: Yes,  home with girlfriend until admission into [facility]. At discharge, do you have transportation home?:Yes,  cab voucher in chart Do you have the ability to pay for your medications:Yes,  Medicare   Release of information consent forms completed and submitted to Medical Records by CSW.   Patient to Follow up at: Follow-up Information   Follow up with [facility] On 09/14/2013. (Call Pat at [phone] on this date (to review what to bring to facility at admission). You are tentatively scheduled for admission on September 21, 2013. )    Contact information:   [address] [place], [place] [address] Phone: [phone] Fax: [phone]      Patient denies SI/HI:   Yes,  during group/self report.    Safety Planning and Suicide Prevention discussed:  Yes,  SPE completd with pt's girlfriend. SPI pamphlet provided to pt and he was encouraged to share information with his support network, ask questions, and talk about any concerns.   Smart, Sandy Blouch LCSWA 09/08/2013, 10:52 AM

## 2013-09-08 NOTE — Progress Notes (Signed)
Patient ID: [name], male   DOB: [birth year], 37 y.o.   MRN: [id number] Pt attended the 10am AA meeting.

## 2013-09-08 NOTE — [facility] Suicide Risk Assessment (Signed)
Suicide Risk Assessment  Discharge Assessment     Demographic Factors:  Male and Caucasian  Mental Status Per Nursing Assessment::   On Admission:  NA  Current Mental Status by Physician: In full contact with reality. There are no suicidal ideas, plans or intent. His mood is euthymic, his affect is appropriate. There are no active S/S of withdrawal. He is going to be able to stay with his girlfriend while waiting for the [facility] bed. He states he is committed to sobriety   Loss Factors: Decline in physical health and Legal issues  Historical Factors: NA  Risk Reduction Factors:   Living with another person, especially a relative and Positive social support  Continued Clinical Symptoms:  Depression:   Comorbid alcohol abuse/dependence Alcohol/Substance Abuse/Dependencies  Cognitive Features That Contribute To Risk:  Polarized thinking Thought constriction (tunnel vision)    Suicide Risk:  Minimal: No identifiable suicidal ideation.  Patients presenting with no risk factors but with morbid ruminations; may be classified as minimal risk based on the severity of the depressive symptoms  Discharge Diagnoses:   AXIS I:  Alcohol Dependence, Depressive Disorder NOS AXIS II:  Deferred AXIS III:   Past Medical History  Diagnosis Date  . Status post lumbar surgery   . History of appendectomy   . History of shoulder surgery   . Chronic back pain   . Chronic shoulder pain     1992  . Drug use     marijunana   . GERD (gastroesophageal reflux disease)   . Smoker     1.5ppd  . Anxiety   . Bronchitis, chronic     "been awhile"   AXIS IV:  housing problems and problems related to legal system/crime AXIS V:  61-70 mild symptoms  Plan Of Care/Follow-up recommendations:  Activity:  as tolerated Diet:  regular Follow up [facility] Is patient on multiple antipsychotic therapies at discharge:  No   Has Patient had three or more failed trials of antipsychotic monotherapy by  history:  No  Recommended Plan for Multiple Antipsychotic Therapies: NA  Anmarie Fukushima A 09/08/2013, 11:07 AM

## 2013-09-13 NOTE — Progress Notes (Signed)
Patient Discharge Instructions:  After Visit Summary (AVS):   Faxed to:  09/13/13 Discharge Summary Note:   Faxed to:  09/13/13 Psychiatric Admission Assessment Note:   Faxed to:  09/13/13 Suicide Risk Assessment - Discharge Assessment:   Faxed to:  09/13/13 Faxed/Sent to the Next Level Care provider:  09/13/13 Faxed to [facility] @ [phone]  Jerelene Redden, 09/13/2013, 4:01 PM

## 2013-11-02 ENCOUNTER — Ambulatory Visit
Admission: RE | Admit: 2013-11-02 | Discharge: 2013-11-02 | Disposition: A | Discharge status: Home / Self Care | Payer: Medicare Other | Source: Ambulatory Visit | Attending: Orthopedic Surgery | Admitting: Orthopedic Surgery

## 2013-11-02 ENCOUNTER — Other Ambulatory Visit: Payer: Self-pay | Admitting: Orthopedic Surgery

## 2013-11-02 DIAGNOSIS — M25562 Pain in left knee: Secondary | ICD-10-CM

## 2013-11-16 ENCOUNTER — Encounter (HOSPITAL_COMMUNITY): Payer: Self-pay | Admitting: Pharmacy Technician

## 2013-11-17 ENCOUNTER — Encounter (HOSPITAL_COMMUNITY): Payer: Self-pay | Admitting: *Deleted

## 2013-11-17 MED ORDER — VANCOMYCIN HCL IN DEXTROSE 1-5 GM/200ML-% IV SOLN
1000.0000 mg | INTRAVENOUS | Status: AC
  Administered 2013-11-18: 1000 mg via INTRAVENOUS
  Filled 2013-11-17: qty 200

## 2013-11-17 NOTE — H&P (Signed)
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 Status post lumbar surgery   . History of appendectomy   . History of shoulder surgery   . Chronic back pain   . Chronic shoulder pain     1992  . Drug use     marijunana   . GERD (gastroesophageal reflux disease)   . Smoker     1.5ppd  . Anxiety   . Bronchitis, chronic     "been awhile"  . Complication of anesthesia   . PONV (postoperative nausea and vomiting)   . Ulcer   . Depression   . Neuropathy   . Short-term memory loss     Past Surgical History  Procedure Laterality Date  . Lumbar spine surgery      x 3   . Application of wound vac Left 03/01/2013    Procedure:  WOUND VAC CHANGE;  Surgeon: Nada LibmanVance W Brabham, MD;  [facility];  Service: Vascular;  Laterality: Left;  . I&d extremity Left 03/01/2013    Procedure: IRRIGATION AND DEBRIDEMENT EXTREMITY;  Surgeon: Nada LibmanVance W Brabham, MD;  [facility];  Service: Vascular;  Laterality: Left;  . I&d extremity Left 03/03/2013    Procedure: REPEAT Irrigation and DRAINAGE OF LEFT LEG;  Surgeon: Budd PalmerMichael H Handy, MD;  Location: [facility] OR;  Service: Orthopedics;  Laterality: Left;  . Application of wound vac Left 03/03/2013    Procedure: APPLICATION OF WOUND VAC;  Surgeon: Budd PalmerMichael H Handy, MD;  [facility];  Service: Orthopedics;  Laterality: Left;  . External fixation leg Left 02/26/2013    Procedure: EXTERNAL FIXATION LEG;  Surgeon: Senaida LangeKevin M Supple, MD;  Location: [facility] OR;  Service: Orthopedics;  Laterality: Left;  . Bypass graft popliteal to tibial Left 02/26/2013    Procedure: BYPASS GRAFT POPLITEAL TO TIBIAL;  Surgeon: Nada LibmanVance W Brabham, MD;  Location: [facility] OR;  Service: Vascular;  Laterality: Left;  using Right Reversed Greater Saphenous Vein  .  Application of wound vac Left 02/26/2013    Procedure: APPLICATION OF WOUND VAC;  Surgeon: Nada LibmanVance W Brabham, MD;  [facility];  Service: Vascular;  Laterality: Left;  . Skin split graft Right 03/08/2013    Procedure: LEFT LEG SPLIT THICKNESS SKIN GRAFT ;  Surgeon: Budd PalmerMichael H Handy, MD;  Location: [facility] OR;  Service: Orthopedics;  Laterality: Right;  . Orif tibia plateau Left 03/08/2013    Procedure: OPEN REDUCTION INTERNAL FIXATION (ORIF) TIBIAL PLATEAU;  Surgeon: Budd PalmerMichael H Handy, MD;  Location: [facility] OR;  Service: Orthopedics;  Laterality: Left;  Placement of cement spacer  . External fixation removal Left 03/08/2013    Procedure: REMOVAL EXTERNAL FIXATION LEG;  Surgeon: Budd PalmerMichael H Handy, MD;  [facility];  Service: Orthopedics;  Laterality: Left;  . Application of wound vac Left 03/08/2013    Procedure: APPLICATION OF WOUND VAC;  Surgeon: Budd PalmerMichael H Handy, MD;  [facility];  Service: Orthopedics;  Laterality: Left;  Wound VAC Exchange  . Back surgery      x 3  . Appendectomy    . Orif tibia plateau Left 07/26/2013    Procedure: NONUNION REPAIR LEFT PROXIMAL TIBIA WITH BONE GRAFT/REMOVING ANTIBIOTIC SPACER;  Surgeon: Budd PalmerMichael H Handy, MD;  Location: [facility] OR;  Service: Orthopedics;  Laterality: Left;  . Rotator cuff repair Left     History reviewed. No pertinent family history. Social History:  reports that he quit smoking 7 days ago. His smoking use included Cigarettes. He smoked 0.00 packs per day for 25 years. He has never used smokeless tobacco. He reports that he uses illicit drugs (Marijuana). He reports that he does not drink alcohol. Pt has used other drugs in past including meth  Allergies:  Allergies  Allergen Reactions  . Flexeril [Cyclobenzaprine] Other (See Comments)    Makes him extremely agitated.   [name] Penicillins Itching and Rash    Meds PTA  Neurontin      Review of Systems  Constitutional: Negative for fever and chills.  Respiratory: Negative for shortness of breath.    Cardiovascular: Negative for chest pain.  Gastrointestinal: Negative for nausea, vomiting and abdominal pain.  Musculoskeletal:       L leg pain   Neurological: Negative for headaches.       Chronic tingling L leg and L foot drop related to back     There were no vitals taken for this visit. Physical Exam  Constitutional: He is oriented to person, place, and time. He is cooperative.  HENT:  Head: Normocephalic and atraumatic.  Mouth/Throat: Abnormal dentition.  Eyes: EOM are normal.  Cardiovascular: Normal rate, regular rhythm, S1 normal and S2 normal.   Respiratory:  Clear anterior fields  GI:  Soft, NTND, + BS  Musculoskeletal:  L Lower Extremity     STSG site healed medial leg    Incision lateral leg healed    + foot drop    No ankle extension    + ankle and toe flexion     + DP pulse    Swelling stable    DPN, SPN, TN sensation grossly intacdt    Ext warm   Neurological: He is alert and oriented to person, place, and time.     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

## 2013-11-18 ENCOUNTER — Observation Stay (HOSPITAL_COMMUNITY): Payer: Medicare Other

## 2013-11-18 ENCOUNTER — Inpatient Hospital Stay (HOSPITAL_COMMUNITY): Payer: Medicare Other

## 2013-11-18 ENCOUNTER — Encounter (HOSPITAL_COMMUNITY): Payer: Self-pay | Admitting: Certified Registered Nurse Anesthetist

## 2013-11-18 ENCOUNTER — Encounter (HOSPITAL_COMMUNITY): Payer: Medicare Other | Admitting: Certified Registered Nurse Anesthetist

## 2013-11-18 ENCOUNTER — Inpatient Hospital Stay (HOSPITAL_COMMUNITY): Payer: Medicare Other | Admitting: Certified Registered Nurse Anesthetist

## 2013-11-18 ENCOUNTER — Encounter (HOSPITAL_COMMUNITY)
Admission: RE | Disposition: A | Discharge status: Home / Self Care | Payer: Self-pay | Source: Ambulatory Visit | Attending: Orthopedic Surgery

## 2013-11-18 ENCOUNTER — Observation Stay (HOSPITAL_COMMUNITY)
Admission: RE | Admit: 2013-11-18 | Discharge: 2013-11-19 | Disposition: A | Discharge status: Home / Self Care | Payer: Medicare Other | Source: Ambulatory Visit | Attending: Orthopedic Surgery | Admitting: Orthopedic Surgery

## 2013-11-18 DIAGNOSIS — M79609 Pain in unspecified limb: Secondary | ICD-10-CM | POA: Insufficient documentation

## 2013-11-18 DIAGNOSIS — G8929 Other chronic pain: Secondary | ICD-10-CM | POA: Insufficient documentation

## 2013-11-18 DIAGNOSIS — Z87891 Personal history of nicotine dependence: Secondary | ICD-10-CM | POA: Insufficient documentation

## 2013-11-18 DIAGNOSIS — G5792 Unspecified mononeuropathy of left lower limb: Secondary | ICD-10-CM | POA: Diagnosis present

## 2013-11-18 DIAGNOSIS — F411 Generalized anxiety disorder: Secondary | ICD-10-CM | POA: Insufficient documentation

## 2013-11-18 DIAGNOSIS — G589 Mononeuropathy, unspecified: Secondary | ICD-10-CM | POA: Insufficient documentation

## 2013-11-18 DIAGNOSIS — K219 Gastro-esophageal reflux disease without esophagitis: Secondary | ICD-10-CM | POA: Diagnosis present

## 2013-11-18 DIAGNOSIS — Y831 Surgical operation with implant of artificial internal device as the cause of abnormal reaction of the patient, or of later complication, without mention of misadventure at the time of the procedure: Secondary | ICD-10-CM | POA: Insufficient documentation

## 2013-11-18 DIAGNOSIS — IMO0002 Reserved for concepts with insufficient information to code with codable children: Principal | ICD-10-CM | POA: Insufficient documentation

## 2013-11-18 DIAGNOSIS — M549 Dorsalgia, unspecified: Secondary | ICD-10-CM | POA: Insufficient documentation

## 2013-11-18 DIAGNOSIS — M25519 Pain in unspecified shoulder: Secondary | ICD-10-CM | POA: Insufficient documentation

## 2013-11-18 DIAGNOSIS — T84498A Other mechanical complication of other internal orthopedic devices, implants and grafts, initial encounter: Secondary | ICD-10-CM | POA: Insufficient documentation

## 2013-11-18 DIAGNOSIS — F3289 Other specified depressive episodes: Secondary | ICD-10-CM | POA: Insufficient documentation

## 2013-11-18 DIAGNOSIS — F329 Major depressive disorder, single episode, unspecified: Secondary | ICD-10-CM | POA: Insufficient documentation

## 2013-11-18 HISTORY — DX: Other specified postprocedural states: R11.2

## 2013-11-18 HISTORY — PX: HARDWARE REMOVAL: SHX979

## 2013-11-18 HISTORY — DX: Other amnesia: R41.3

## 2013-11-18 HISTORY — DX: Major depressive disorder, single episode, unspecified: F32.9

## 2013-11-18 HISTORY — DX: Depression, unspecified: F32.A

## 2013-11-18 HISTORY — PX: ORIF TIBIA FRACTURE: SHX5416

## 2013-11-18 HISTORY — DX: Other specified postprocedural states: Z98.890

## 2013-11-18 HISTORY — DX: Polyneuropathy, unspecified: G62.9

## 2013-11-18 HISTORY — DX: Reserved for concepts with insufficient information to code with codable children: IMO0002

## 2013-11-18 LAB — GRAM STAIN

## 2013-11-18 LAB — RAPID URINE DRUG SCREEN, HOSP PERFORMED
Amphetamines: NOT DETECTED
BENZODIAZEPINES: POSITIVE — AB
Barbiturates: NOT DETECTED
COCAINE: NOT DETECTED
Opiates: POSITIVE — AB
Tetrahydrocannabinol: POSITIVE — AB

## 2013-11-18 LAB — URINALYSIS, ROUTINE W REFLEX MICROSCOPIC
BILIRUBIN URINE: NEGATIVE
Glucose, UA: NEGATIVE mg/dL
Hgb urine dipstick: NEGATIVE
KETONES UR: NEGATIVE mg/dL
NITRITE: NEGATIVE
Protein, ur: NEGATIVE mg/dL
Specific Gravity, Urine: 1.021 (ref 1.005–1.030)
Urobilinogen, UA: 0.2 mg/dL (ref 0.0–1.0)
pH: 5.5 (ref 5.0–8.0)

## 2013-11-18 LAB — URINE MICROSCOPIC-ADD ON

## 2013-11-18 LAB — COMPREHENSIVE METABOLIC PANEL
ALT: 21 U/L (ref 0–53)
AST: 21 U/L (ref 0–37)
Albumin: 3.2 g/dL — ABNORMAL LOW (ref 3.5–5.2)
Alkaline Phosphatase: 178 U/L — ABNORMAL HIGH (ref 39–117)
BUN: 15 mg/dL (ref 6–23)
CALCIUM: 8.2 mg/dL — AB (ref 8.4–10.5)
CHLORIDE: 104 meq/L (ref 96–112)
CO2: 22 mEq/L (ref 19–32)
Creatinine, Ser: 0.93 mg/dL (ref 0.50–1.35)
GFR calc Af Amer: >90 mL/min (ref >90)
Glucose, Bld: 134 mg/dL — ABNORMAL HIGH (ref 70–99)
Potassium: 4.3 mEq/L (ref 3.7–5.3)
Sodium: 140 mEq/L (ref 137–147)
Total Protein: 6.7 g/dL (ref 6.0–8.3)

## 2013-11-18 LAB — CBC
HCT: 43.8 % (ref 39.0–52.0)
Hemoglobin: 15 g/dL (ref 13.0–17.0)
MCH: 30.9 pg (ref 26.0–34.0)
MCHC: 34.2 g/dL (ref 30.0–36.0)
MCV: 90.3 fL (ref 78.0–100.0)
PLATELETS: 313 10*3/uL (ref 150–400)
RBC: 4.85 MIL/uL (ref 4.22–5.81)
RDW: 14.3 % (ref 11.5–15.5)
WBC: 10.6 10*3/uL — AB (ref 4.0–10.5)

## 2013-11-18 LAB — CBC WITH DIFFERENTIAL/PLATELET
BASOS PCT: 1 % (ref 0–1)
Basophils Absolute: 0.1 10*3/uL (ref 0.0–0.1)
EOS ABS: 0.5 10*3/uL (ref 0.0–0.7)
EOS PCT: 4 % (ref 0–5)
HEMATOCRIT: 42 % (ref 39.0–52.0)
Hemoglobin: 14.3 g/dL (ref 13.0–17.0)
Lymphocytes Relative: 24 % (ref 12–46)
Lymphs Abs: 2.8 10*3/uL (ref 0.7–4.0)
MCH: 30.5 pg (ref 26.0–34.0)
MCHC: 34 g/dL (ref 30.0–36.0)
MCV: 89.6 fL (ref 78.0–100.0)
MONO ABS: 1 10*3/uL (ref 0.1–1.0)
MONOS PCT: 8 % (ref 3–12)
NEUTROS ABS: 7.4 10*3/uL (ref 1.7–7.7)
Neutrophils Relative %: 64 % (ref 43–77)
Platelets: 293 10*3/uL (ref 150–400)
RBC: 4.69 MIL/uL (ref 4.22–5.81)
RDW: 14.2 % (ref 11.5–15.5)
WBC: 11.6 10*3/uL — ABNORMAL HIGH (ref 4.0–10.5)

## 2013-11-18 LAB — CREATININE, SERUM
Creatinine, Ser: 1.05 mg/dL (ref 0.50–1.35)
GFR calc Af Amer: >90 mL/min (ref >90)
GFR, EST NON AFRICAN AMERICAN: 88 mL/min — AB (ref >90)

## 2013-11-18 LAB — C-REACTIVE PROTEIN: CRP: 0.7 mg/dL — ABNORMAL HIGH (ref <0.60)

## 2013-11-18 LAB — PROTIME-INR
INR: 0.88 (ref 0.00–1.49)
PROTHROMBIN TIME: 11.8 s (ref 11.6–15.2)

## 2013-11-18 LAB — SEDIMENTATION RATE: SED RATE: 26 mm/h — AB (ref 0–16)

## 2013-11-18 SURGERY — OPEN REDUCTION INTERNAL FIXATION (ORIF) TIBIA FRACTURE
Anesthesia: General | Site: Leg Lower | Laterality: Left

## 2013-11-18 MED ORDER — BACLOFEN 10 MG PO TABS
10.0000 mg | ORAL_TABLET | Freq: Three times a day (TID) | ORAL | Status: DC
  Administered 2013-11-18 – 2013-11-19 (×4): 10 mg via ORAL
  Filled 2013-11-18 (×5): qty 1

## 2013-11-18 MED ORDER — OXYCODONE HCL 5 MG/5ML PO SOLN: 5.0000 mg | Freq: Once | ORAL | Status: DC | PRN

## 2013-11-18 MED ORDER — POLYETHYLENE GLYCOL 3350 17 G PO PACK
17.0000 g | PACK | Freq: Every day | ORAL | Status: DC
  Filled 2013-11-18 (×2): qty 1

## 2013-11-18 MED ORDER — GABAPENTIN 300 MG PO CAPS
600.0000 mg | ORAL_CAPSULE | Freq: Three times a day (TID) | ORAL | Status: DC
  Administered 2013-11-18 – 2013-11-19 (×4): 600 mg via ORAL
  Filled 2013-11-18 (×5): qty 2

## 2013-11-18 MED ORDER — DOCUSATE SODIUM 100 MG PO CAPS
100.0000 mg | ORAL_CAPSULE | Freq: Two times a day (BID) | ORAL | Status: DC
  Administered 2013-11-18 – 2013-11-19 (×2): 100 mg via ORAL
  Filled 2013-11-18 (×3): qty 1

## 2013-11-18 MED ORDER — ONDANSETRON HCL 4 MG PO TABS
4.0000 mg | ORAL_TABLET | Freq: Four times a day (QID) | ORAL | Status: DC | PRN
Start: 2013-11-18 — End: 2013-11-19

## 2013-11-18 MED ORDER — HYDROMORPHONE 0.3 MG/ML IV SOLN
INTRAVENOUS | Status: DC
  Administered 2013-11-18: 0.3 mg via INTRAVENOUS

## 2013-11-18 MED ORDER — LORAZEPAM 2 MG/ML IJ SOLN: 1.0000 mg | Freq: Four times a day (QID) | INTRAMUSCULAR | Status: AC | PRN

## 2013-11-18 MED ORDER — PANTOPRAZOLE SODIUM 40 MG PO TBEC
80.0000 mg | DELAYED_RELEASE_TABLET | Freq: Every day | ORAL | Status: DC
  Administered 2013-11-18 – 2013-11-19 (×2): 80 mg via ORAL
  Filled 2013-11-18: qty 2

## 2013-11-18 MED ORDER — FENTANYL CITRATE 0.05 MG/ML IJ SOLN
INTRAMUSCULAR | Status: DC | PRN
  Administered 2013-11-18 (×7): 50 ug via INTRAVENOUS

## 2013-11-18 MED ORDER — VANCOMYCIN HCL IN DEXTROSE 1-5 GM/200ML-% IV SOLN
1000.0000 mg | Freq: Two times a day (BID) | INTRAVENOUS | Status: AC
  Administered 2013-11-18: 1000 mg via INTRAVENOUS
  Filled 2013-11-18: qty 200

## 2013-11-18 MED ORDER — GLYCOPYRROLATE 0.2 MG/ML IJ SOLN
INTRAMUSCULAR | Status: AC
  Filled 2013-11-18: qty 3

## 2013-11-18 MED ORDER — SODIUM CHLORIDE 0.9 % IJ SOLN: 9.0000 mL | INTRAMUSCULAR | Status: DC | PRN

## 2013-11-18 MED ORDER — 0.9 % SODIUM CHLORIDE (POUR BTL) OPTIME
TOPICAL | Status: DC | PRN
  Administered 2013-11-18: 1000 mL

## 2013-11-18 MED ORDER — DOXEPIN HCL 75 MG PO CAPS
150.0000 mg | ORAL_CAPSULE | Freq: Every day | ORAL | Status: DC
  Administered 2013-11-18: 150 mg via ORAL
  Filled 2013-11-18 (×2): qty 2

## 2013-11-18 MED ORDER — PROPOFOL 10 MG/ML IV BOLUS
INTRAVENOUS | Status: AC
  Filled 2013-11-18: qty 20

## 2013-11-18 MED ORDER — ROCURONIUM BROMIDE 50 MG/5ML IV SOLN
INTRAVENOUS | Status: AC
  Filled 2013-11-18: qty 1

## 2013-11-18 MED ORDER — MIDAZOLAM HCL 2 MG/2ML IJ SOLN
INTRAMUSCULAR | Status: AC
  Filled 2013-11-18: qty 2

## 2013-11-18 MED ORDER — EPHEDRINE SULFATE 50 MG/ML IJ SOLN
INTRAMUSCULAR | Status: AC
  Filled 2013-11-18: qty 1

## 2013-11-18 MED ORDER — MAGNESIUM CITRATE PO SOLN: 1.0000 | Freq: Once | ORAL | Status: AC | PRN

## 2013-11-18 MED ORDER — DIPHENHYDRAMINE HCL 50 MG/ML IJ SOLN
12.5000 mg | Freq: Four times a day (QID) | INTRAMUSCULAR | Status: DC | PRN
  Filled 2013-11-18: qty 0.25

## 2013-11-18 MED ORDER — HYDROMORPHONE HCL PF 1 MG/ML IJ SOLN
0.2500 mg | INTRAMUSCULAR | Status: DC | PRN
  Administered 2013-11-18 (×2): 0.5 mg via INTRAVENOUS

## 2013-11-18 MED ORDER — BISACODYL 5 MG PO TBEC: 5.0000 mg | DELAYED_RELEASE_TABLET | Freq: Every day | ORAL | Status: DC | PRN

## 2013-11-18 MED ORDER — METOCLOPRAMIDE HCL 5 MG/ML IJ SOLN
5.0000 mg | Freq: Three times a day (TID) | INTRAMUSCULAR | Status: DC | PRN
  Administered 2013-11-18: 10 mg via INTRAVENOUS
  Filled 2013-11-18: qty 2

## 2013-11-18 MED ORDER — NEOSTIGMINE METHYLSULFATE 1 MG/ML IJ SOLN
INTRAMUSCULAR | Status: DC | PRN
  Administered 2013-11-18: 4 mg via INTRAVENOUS

## 2013-11-18 MED ORDER — FENTANYL CITRATE 0.05 MG/ML IJ SOLN
INTRAMUSCULAR | Status: AC
  Filled 2013-11-18: qty 5

## 2013-11-18 MED ORDER — POTASSIUM CHLORIDE IN NACL 20-0.9 MEQ/L-% IV SOLN
INTRAVENOUS | Status: DC
Start: 2013-11-18 — End: 2013-11-19
  Administered 2013-11-18: 100 mL/h via INTRAVENOUS
  Administered 2013-11-19: 1000 mL via INTRAVENOUS
  Filled 2013-11-18 (×4): qty 1000

## 2013-11-18 MED ORDER — ONDANSETRON HCL 4 MG/2ML IJ SOLN
INTRAMUSCULAR | Status: AC
Start: 2013-11-18 — End: 2013-11-18
  Filled 2013-11-18: qty 2

## 2013-11-18 MED ORDER — HYDROMORPHONE HCL PF 1 MG/ML IJ SOLN
INTRAMUSCULAR | Status: AC
  Filled 2013-11-18: qty 1

## 2013-11-18 MED ORDER — ROCURONIUM BROMIDE 100 MG/10ML IV SOLN
INTRAVENOUS | Status: DC | PRN
  Administered 2013-11-18: 50 mg via INTRAVENOUS
  Administered 2013-11-18 (×2): 10 mg via INTRAVENOUS
  Administered 2013-11-18: 20 mg via INTRAVENOUS

## 2013-11-18 MED ORDER — LACTATED RINGERS IV SOLN
INTRAVENOUS | Status: DC | PRN
  Administered 2013-11-18 (×2): via INTRAVENOUS

## 2013-11-18 MED ORDER — HYDROMORPHONE 0.3 MG/ML IV SOLN
INTRAVENOUS | Status: AC
  Filled 2013-11-18: qty 25

## 2013-11-18 MED ORDER — ENOXAPARIN SODIUM 40 MG/0.4ML ~~LOC~~ SOLN
40.0000 mg | SUBCUTANEOUS | Status: DC
  Administered 2013-11-19: 40 mg via SUBCUTANEOUS
  Filled 2013-11-18 (×2): qty 0.4

## 2013-11-18 MED ORDER — HYDROXYZINE HCL 50 MG PO TABS
50.0000 mg | ORAL_TABLET | Freq: Every day | ORAL | Status: DC
  Administered 2013-11-18: 50 mg via ORAL
  Filled 2013-11-18 (×2): qty 1

## 2013-11-18 MED ORDER — LACTATED RINGERS IV SOLN
INTRAVENOUS | Status: DC
Start: 2013-11-18 — End: 2013-11-18

## 2013-11-18 MED ORDER — CHLORHEXIDINE GLUCONATE 4 % EX LIQD
60.0000 mL | Freq: Once | CUTANEOUS | Status: DC
Start: 2013-11-18 — End: 2013-11-18

## 2013-11-18 MED ORDER — NALOXONE HCL 0.4 MG/ML IJ SOLN
0.4000 mg | INTRAMUSCULAR | Status: DC | PRN
  Filled 2013-11-18: qty 1

## 2013-11-18 MED ORDER — NEOSTIGMINE METHYLSULFATE 1 MG/ML IJ SOLN
INTRAMUSCULAR | Status: AC
  Filled 2013-11-18: qty 10

## 2013-11-18 MED ORDER — METOCLOPRAMIDE HCL 10 MG PO TABS: 5.0000 mg | ORAL_TABLET | Freq: Three times a day (TID) | ORAL | Status: DC | PRN

## 2013-11-18 MED ORDER — HYDROMORPHONE HCL PF 1 MG/ML IJ SOLN
0.5000 mg | INTRAMUSCULAR | Status: DC | PRN
  Administered 2013-11-19 (×4): 1 mg via INTRAVENOUS
  Filled 2013-11-18 (×4): qty 1

## 2013-11-18 MED ORDER — ONDANSETRON HCL 4 MG/2ML IJ SOLN
4.0000 mg | Freq: Four times a day (QID) | INTRAMUSCULAR | Status: DC | PRN
Start: 2013-11-18 — End: 2013-11-19

## 2013-11-18 MED ORDER — DIPHENHYDRAMINE HCL 12.5 MG/5ML PO ELIX
12.5000 mg | ORAL_SOLUTION | Freq: Four times a day (QID) | ORAL | Status: DC | PRN
Start: 2013-11-18 — End: 2013-11-18
  Filled 2013-11-18: qty 5

## 2013-11-18 MED ORDER — ACETAMINOPHEN 500 MG PO TABS: 1000.0000 mg | ORAL_TABLET | Freq: Once | ORAL | Status: DC

## 2013-11-18 MED ORDER — AMITRIPTYLINE HCL 50 MG PO TABS
50.0000 mg | ORAL_TABLET | Freq: Every day | ORAL | Status: DC
  Administered 2013-11-18: 50 mg via ORAL
  Filled 2013-11-18 (×2): qty 1

## 2013-11-18 MED ORDER — HYDROCODONE-ACETAMINOPHEN 10-325 MG PO TABS
1.0000 | ORAL_TABLET | ORAL | Status: DC | PRN
  Administered 2013-11-19 (×2): 2 via ORAL
  Filled 2013-11-18 (×2): qty 2

## 2013-11-18 MED ORDER — OXYCODONE HCL 5 MG PO TABS
5.0000 mg | ORAL_TABLET | ORAL | Status: DC | PRN
  Administered 2013-11-18 – 2013-11-19 (×3): 10 mg via ORAL
  Filled 2013-11-18 (×4): qty 2

## 2013-11-18 MED ORDER — LIDOCAINE HCL (CARDIAC) 20 MG/ML IV SOLN
INTRAVENOUS | Status: AC
  Filled 2013-11-18: qty 5

## 2013-11-18 MED ORDER — ONDANSETRON HCL 4 MG/2ML IJ SOLN
INTRAMUSCULAR | Status: DC | PRN
  Administered 2013-11-18: 4 mg via INTRAVENOUS

## 2013-11-18 MED ORDER — GLYCOPYRROLATE 0.2 MG/ML IJ SOLN
INTRAMUSCULAR | Status: DC | PRN
  Administered 2013-11-18: .6 mg via INTRAVENOUS

## 2013-11-18 MED ORDER — MIDAZOLAM HCL 5 MG/5ML IJ SOLN
INTRAMUSCULAR | Status: DC | PRN
  Administered 2013-11-18: 2 mg via INTRAVENOUS

## 2013-11-18 MED ORDER — PROMETHAZINE HCL 25 MG/ML IJ SOLN: 6.2500 mg | INTRAMUSCULAR | Status: DC | PRN

## 2013-11-18 MED ORDER — LORAZEPAM 1 MG PO TABS
1.0000 mg | ORAL_TABLET | Freq: Four times a day (QID) | ORAL | Status: AC | PRN
  Administered 2013-11-18: 1 mg via ORAL
  Filled 2013-11-18: qty 1

## 2013-11-18 MED ORDER — OXYCODONE HCL 5 MG PO TABS: 5.0000 mg | ORAL_TABLET | Freq: Once | ORAL | Status: DC | PRN

## 2013-11-18 MED ORDER — LIDOCAINE HCL (CARDIAC) 20 MG/ML IV SOLN
INTRAVENOUS | Status: DC | PRN
  Administered 2013-11-18: 100 mg via INTRAVENOUS

## 2013-11-18 MED ORDER — PROPOFOL 10 MG/ML IV BOLUS
INTRAVENOUS | Status: DC | PRN
  Administered 2013-11-18: 150 mg via INTRAVENOUS

## 2013-11-18 MED ORDER — ONDANSETRON HCL 4 MG/2ML IJ SOLN: 4.0000 mg | Freq: Four times a day (QID) | INTRAMUSCULAR | Status: DC | PRN

## 2013-11-18 SURGICAL SUPPLY — 103 items
BANDAGE ELASTIC 4 VELCRO ST LF (GAUZE/BANDAGES/DRESSINGS) ×3 IMPLANT
BANDAGE ELASTIC 6 VELCRO ST LF (GAUZE/BANDAGES/DRESSINGS) ×3 IMPLANT
BANDAGE ESMARK 6X9 LF (GAUZE/BANDAGES/DRESSINGS) ×1 IMPLANT
BANDAGE GAUZE ELAST BULKY 4 IN (GAUZE/BANDAGES/DRESSINGS) ×6 IMPLANT
BIT DRILL 2.5X110 QC LCP DISP (BIT) ×3 IMPLANT
BIT DRILL PERC QC 2.8X200 100 (BIT) ×1 IMPLANT
BLADE SURG 10 STRL SS (BLADE) ×3 IMPLANT
BLADE SURG 15 STRL LF DISP TIS (BLADE) ×1 IMPLANT
BLADE SURG 15 STRL SS (BLADE) ×2
BLADE SURG ROTATE 9660 (MISCELLANEOUS) IMPLANT
BNDG COHESIVE 4X5 TAN STRL (GAUZE/BANDAGES/DRESSINGS) ×3 IMPLANT
BNDG COHESIVE 6X5 TAN STRL LF (GAUZE/BANDAGES/DRESSINGS) ×3 IMPLANT
BNDG ESMARK 6X9 LF (GAUZE/BANDAGES/DRESSINGS) ×3
BONE CANC CHIPS 40CC CAN1/2 (Bone Implant) ×3 IMPLANT
BRUSH SCRUB DISP (MISCELLANEOUS) ×6 IMPLANT
CHIPS CANC BONE 40CC CAN1/2 (Bone Implant) ×1 IMPLANT
CLEANER TIP ELECTROSURG 2X2 (MISCELLANEOUS) ×3 IMPLANT
CLOSURE WOUND 1/2 X4 (GAUZE/BANDAGES/DRESSINGS)
CLOTH BEACON ORANGE TIMEOUT ST (SAFETY) ×3 IMPLANT
COVER MAYO STAND STRL (DRAPES) ×3 IMPLANT
COVER SURGICAL LIGHT HANDLE (MISCELLANEOUS) ×6 IMPLANT
CUFF TOURNIQUET SINGLE 18IN (TOURNIQUET CUFF) IMPLANT
CUFF TOURNIQUET SINGLE 24IN (TOURNIQUET CUFF) IMPLANT
CUFF TOURNIQUET SINGLE 34IN LL (TOURNIQUET CUFF) IMPLANT
DRAPE C-ARM 42X72 X-RAY (DRAPES) ×3 IMPLANT
DRAPE C-ARMOR (DRAPES) ×3 IMPLANT
DRAPE INCISE IOBAN 66X45 STRL (DRAPES) ×3 IMPLANT
DRAPE OEC MINIVIEW 54X84 (DRAPES) ×3 IMPLANT
DRAPE ORTHO SPLIT 77X108 STRL (DRAPES)
DRAPE SURG ORHT 6 SPLT 77X108 (DRAPES) IMPLANT
DRAPE U-SHAPE 47X51 STRL (DRAPES) ×3 IMPLANT
DRILL BIT QUICK COUP 2.8MM 100 (BIT) ×2
DRSG ADAPTIC 3X8 NADH LF (GAUZE/BANDAGES/DRESSINGS) ×3 IMPLANT
DRSG PAD ABDOMINAL 8X10 ST (GAUZE/BANDAGES/DRESSINGS) ×12 IMPLANT
ELECT REM PT RETURN 9FT ADLT (ELECTROSURGICAL) ×3
ELECTRODE REM PT RTRN 9FT ADLT (ELECTROSURGICAL) ×1 IMPLANT
EVACUATOR 1/8 PVC DRAIN (DRAIN) IMPLANT
EVACUATOR 3/16  PVC DRAIN (DRAIN)
EVACUATOR 3/16 PVC DRAIN (DRAIN) IMPLANT
GLOVE BIO SURGEON STRL SZ7.5 (GLOVE) ×3 IMPLANT
GLOVE BIO SURGEON STRL SZ8 (GLOVE) ×3 IMPLANT
GLOVE BIOGEL PI IND STRL 7.5 (GLOVE) ×1 IMPLANT
GLOVE BIOGEL PI IND STRL 8 (GLOVE) ×1 IMPLANT
GLOVE BIOGEL PI INDICATOR 7.5 (GLOVE) ×2
GLOVE BIOGEL PI INDICATOR 8 (GLOVE) ×2
GOWN STRL REUS W/ TWL LRG LVL3 (GOWN DISPOSABLE) ×2 IMPLANT
GOWN STRL REUS W/ TWL XL LVL3 (GOWN DISPOSABLE) ×1 IMPLANT
GOWN STRL REUS W/TWL LRG LVL3 (GOWN DISPOSABLE) ×4
GOWN STRL REUS W/TWL XL LVL3 (GOWN DISPOSABLE) ×2
IMMOBILIZER KNEE 22 UNIV (SOFTGOODS) ×3 IMPLANT
KIT BASIN OR (CUSTOM PROCEDURE TRAY) ×3 IMPLANT
KIT INFUSE LRG II (Orthopedic Implant) ×3 IMPLANT
KIT ROOM TURNOVER OR (KITS) ×3 IMPLANT
MANIFOLD NEPTUNE II (INSTRUMENTS) ×3 IMPLANT
NDL SUT .5 MAYO 1.404X.05X (NEEDLE) IMPLANT
NEEDLE 22X1 1/2 (OR ONLY) (NEEDLE) IMPLANT
NEEDLE MAYO TAPER (NEEDLE)
NS IRRIG 1000ML POUR BTL (IV SOLUTION) ×3 IMPLANT
PACK ORTHO EXTREMITY (CUSTOM PROCEDURE TRAY) ×3 IMPLANT
PAD ABD 8X10 STRL (GAUZE/BANDAGES/DRESSINGS) ×6 IMPLANT
PAD ARMBOARD 7.5X6 YLW CONV (MISCELLANEOUS) ×6 IMPLANT
PAD CAST 4YDX4 CTTN HI CHSV (CAST SUPPLIES) ×1 IMPLANT
PADDING CAST COTTON 4X4 STRL (CAST SUPPLIES) ×2
PADDING CAST COTTON 6X4 STRL (CAST SUPPLIES) ×9 IMPLANT
PLATE TIBIA PROX 14H 3.5VA LT (Plate) ×3 IMPLANT
SCREW CORTEX 3.5 24MM (Screw) ×2 IMPLANT
SCREW CORTEX 3.5 26MM (Screw) ×2 IMPLANT
SCREW CORTEX 3.5 32MM (Screw) ×2 IMPLANT
SCREW LOCK CORT ST 3.5X24 (Screw) ×1 IMPLANT
SCREW LOCK CORT ST 3.5X26 (Screw) ×1 IMPLANT
SCREW LOCK CORT ST 3.5X32 (Screw) ×1 IMPLANT
SCREW LOCKING 3.5X80MM VA (Screw) ×9 IMPLANT
SCREW LOCKING VA 3.5X24MM (Screw) ×3 IMPLANT
SCREW LOCKING VA 3.5X75MM (Screw) ×3 IMPLANT
SCREW LOCKING VA 3.5X85MM (Screw) ×6 IMPLANT
SCREW SCHNZ SD 5.0 80 THRD/200 (Screw) ×2 IMPLANT
SCRW SCHANZ SD 5.0 80 THRD/200 (Screw) ×6 IMPLANT
SPONGE GAUZE 4X4 12PLY (GAUZE/BANDAGES/DRESSINGS) ×3 IMPLANT
SPONGE LAP 18X18 X RAY DECT (DISPOSABLE) ×3 IMPLANT
SPONGE SCRUB IODOPHOR (GAUZE/BANDAGES/DRESSINGS) ×3 IMPLANT
STAPLER VISISTAT 35W (STAPLE) ×3 IMPLANT
STOCKINETTE IMPERVIOUS LG (DRAPES) ×3 IMPLANT
STRIP CLOSURE SKIN 1/2X4 (GAUZE/BANDAGES/DRESSINGS) IMPLANT
SUCTION FRAZIER TIP 10 FR DISP (SUCTIONS) ×3 IMPLANT
SUT ETHILON 3 0 PS 1 (SUTURE) IMPLANT
SUT PDS AB 2-0 CT1 27 (SUTURE) IMPLANT
SUT PROLENE 0 CT 2 (SUTURE) ×6 IMPLANT
SUT VIC AB 0 CT1 27 (SUTURE) ×2
SUT VIC AB 0 CT1 27XBRD ANBCTR (SUTURE) ×1 IMPLANT
SUT VIC AB 1 CT1 27 (SUTURE) ×2
SUT VIC AB 1 CT1 27XBRD ANBCTR (SUTURE) ×1 IMPLANT
SUT VIC AB 2-0 CT1 27 (SUTURE) ×4
SUT VIC AB 2-0 CT1 TAPERPNT 27 (SUTURE) ×2 IMPLANT
SYR 20ML ECCENTRIC (SYRINGE) IMPLANT
SYR CONTROL 10ML LL (SYRINGE) IMPLANT
TOWEL OR 17X24 6PK STRL BLUE (TOWEL DISPOSABLE) ×6 IMPLANT
TOWEL OR 17X26 10 PK STRL BLUE (TOWEL DISPOSABLE) ×6 IMPLANT
TRAY FOLEY CATH 16FRSI W/METER (SET/KITS/TRAYS/PACK) IMPLANT
TUBE CONNECTING 12'X1/4 (SUCTIONS) ×1
TUBE CONNECTING 12X1/4 (SUCTIONS) ×2 IMPLANT
UNDERPAD 30X30 INCONTINENT (UNDERPADS AND DIAPERS) ×3 IMPLANT
WATER STERILE IRR 1000ML POUR (IV SOLUTION) ×6 IMPLANT
YANKAUER SUCT BULB TIP NO VENT (SUCTIONS) ×3 IMPLANT

## 2013-11-18 NOTE — Anesthesia Preprocedure Evaluation (Addendum)
Anesthesia Evaluation  Patient identified by MRN, date of birth, ID band Patient awake    Reviewed: Allergy & Precautions, H&P , NPO status , Patient's Chart, lab work & pertinent test results  Airway Mallampati: I TM Distance: >3 FB Neck ROM: Full    Dental no notable dental hx. (+) Edentulous Upper, Dental Advisory Given   Pulmonary Current Smoker, former smoker,  breath sounds clear to auscultation  Pulmonary exam normal       Cardiovascular negative cardio ROS  Rhythm:Regular Rate:Normal     Neuro/Psych negative neurological ROS     GI/Hepatic GERD-  Medicated and Controlled,(+)     substance abuse  marijuana use,   Endo/Other  negative endocrine ROS  Renal/GU      Musculoskeletal   Abdominal   Peds  Hematology negative hematology ROS (+)   Anesthesia Other Findings   Reproductive/Obstetrics negative OB ROS                          Anesthesia Physical Anesthesia Plan  ASA: II  Anesthesia Plan:    Post-op Pain Management:    Induction: Intravenous  Airway Management Planned: Oral ETT  Additional Equipment:   Intra-op Plan:   Post-operative Plan: Extubation in OR  Informed Consent: I have reviewed the patients History and Physical, chart, labs and discussed the procedure including the risks, benefits and alternatives for the proposed anesthesia with the patient or authorized representative who has indicated his/her understanding and acceptance.   Dental advisory given  Plan Discussed with: CRNA and Surgeon  Anesthesia Plan Comments:         Anesthesia Quick Evaluation

## 2013-11-18 NOTE — Progress Notes (Signed)
Orthopedic Tech Progress Note Patient Details:  [name] HedgesLarry Pierce 22-Oct-1975 [id number]  Patient ID: [name] HedgesLarry Pierce, male   DOB: 22-Oct-1975, 38 y.o.   MRN: [id number]   [name] PonsCammer, [name] [name] 11/18/2013, 2:59 PMTrapeze bar

## 2013-11-18 NOTE — Brief Op Note (Signed)
11/18/2013  11:17 AM  PATIENT:  [name]  38 y.o. male  PRE-OPERATIVE DIAGNOSIS:   1. NONUNION LEFT TIBIAL SHAFT 2. MALUNION PROXIMAL TIBIA 3. BROKEN HARDWARE  POST-OPERATIVE DIAGNOSIS:   1. NONUNION LEFT TIBIAL SHAFT 2. MALUNION PROXIMAL TIBIA 3. BROKEN HARDWARE  PROCEDURE:  Procedure(s): 1. REPAIR OF NONUNION WITH PLATING AND GRAFTING 2. TIBIAL OSTEOTOMY AND CORRECTION OF MALUNION USING FEMORAL DISTRACTOR 3. REMOVAL OF BROKEN HARDWARE  SURGEON:  Surgeon(s) and Role:    * Budd PalmerMichael H Raunak Antuna, MD - Primary  PHYSICIAN ASSISTANT: Montez MoritaKeith Paul, PA-C  ANESTHESIA:   general  I/O:  Total I/O In: -  Out: 680 [Urine:530; Blood:150]  SPECIMEN:  Source of Specimen:  NONUNION SITE  DISPOSITION OF SPECIMEN:  To micro  TOURNIQUET:   Total Tourniquet Time Documented: Thigh (Left) - 125 minutes Total: Thigh (Left) - 125 minutes   DICTATION: .Other Dictation: Dictation Number [id number]

## 2013-11-18 NOTE — H&P (Signed)
I have seen and examined the patient. I agree with the findings above.  I discussed with the patient the risks and benefits of surgery, including the possibility of infection, nerve injury, vessel injury, wound breakdown, arthritis, symptomatic hardware, DVT/ PE, loss of motion, and need for further surgery among others.  We also specifically discussed the elevated risk of soft tissue breakdown that could lead to amputation.  He understood these risks and wished to proceed.   Budd PalmerHANDY,Natasha Paulson H, MD 11/18/2013 7:50AM

## 2013-11-18 NOTE — Transfer of Care (Signed)
Immediate Anesthesia Transfer of Care Note  Patient: [name] HedgesLarry Pierce  Procedure(s) Performed: Procedure(s): ORIF tibia (Left) HARDWARE REMOVAL (Left)  Patient Location: PACU  Anesthesia Type:General  Level of Consciousness: awake, alert  and oriented  Airway & Oxygen Therapy: Patient Spontanous Breathing  Post-op Assessment: Report given to PACU RN  Post vital signs: Reviewed and stable  Complications: No apparent anesthesia complications

## 2013-11-18 NOTE — Evaluation (Signed)
Physical Therapy Evaluation Patient Details Name: [name] HedgesLarry Rupe MRN: [id number] DOB: September 06, 1976 Today's Date: 11/18/2013 Time: [id number] PT Time Calculation (min): 29 min  PT Assessment / Plan / Recommendation History of Present Illness  pt s/p TIBIAL OSTEOTOMY AND CORRECTION OF MALUNION USING FEMORAL DISTRACTOR  Clinical Impression  Pt presents with decreased activity tolerance, impaired mobility and increased pain. Pt will benefit from acute PT services to address deficits and increase functional independence.  Discussed positioning to reduce swelling with pt and safety with transfers.    PT Assessment  Patient needs continued PT services    Follow Up Recommendations  No PT follow up;Supervision - Intermittent (pt states he has previously rec'd HHPT, depending on progress no f/u PT recommended)    Does the patient have the potential to tolerate intense rehabilitation      Barriers to Discharge        Equipment Recommendations  None recommended by PT    Recommendations for Other Services     Frequency Min 5X/week    Precautions / Restrictions Restrictions Weight Bearing Restrictions: Yes LLE Weight Bearing: Touchdown weight bearing   Pertinent Vitals/Pain Pt c/o 6/10 L LE pain, PCA available      Mobility  Bed Mobility Overal bed mobility: Modified Independent Transfers Overall transfer level: Needs assistance Equipment used: Rolling walker (2 wheeled) Transfers: Stand Pivot Transfers Stand pivot transfers: Supervision General transfer comment: pt able to maintain TDWB without cues    Exercises     PT Diagnosis: Difficulty walking;Generalized weakness;Acute pain  PT Problem List: Decreased activity tolerance;Decreased mobility;Decreased strength;Pain PT Treatment Interventions: Gait training;Stair training;Functional mobility training;Therapeutic activities;Therapeutic exercise;Wheelchair mobility training;Patient/family education;Neuromuscular re-education;DME  instruction;Balance training;Modalities     PT Goals(Current goals can be found in the care plan section) Acute Rehab PT Goals Patient Stated Goal: go home to live with girlfriend PT Goal Formulation: With patient Time For Goal Achievement: 11/25/13 Potential to Achieve Goals: Good  Visit Information  Last PT Received On: 11/18/13 Assistance Needed: +1 History of Present Illness: pt s/p TIBIAL OSTEOTOMY AND CORRECTION OF MALUNION USING FEMORAL DISTRACTOR       Prior Functioning  Home Living Family/patient expects to be discharged to:: Private residence Living Arrangements: Spouse/significant other Available Help at Discharge: Friend(s) Type of Home: Apartment Home Access: Level entry Home Layout: One level Home Equipment: Environmental consultantWalker - 2 wheels;Wheelchair - manual Prior Function Level of Independence: Independent with assistive device(s) Communication Communication: No difficulties    Cognition  Cognition Arousal/Alertness: Awake/alert Behavior During Therapy: WFL for tasks assessed/performed Overall Cognitive Status: Within Functional Limits for tasks assessed    Extremity/Trunk Assessment Upper Extremity Assessment Upper Extremity Assessment: Overall WFL for tasks assessed Lower Extremity Assessment Lower Extremity Assessment: Generalized weakness Cervical / Trunk Assessment Cervical / Trunk Assessment: Normal   Balance Balance Overall balance assessment: No apparent balance deficits (not formally assessed)  End of Session PT - End of Session Equipment Utilized During Treatment: Gait belt Activity Tolerance: Patient tolerated treatment well Patient left: in chair;with call bell/phone within reach Nurse Communication: Mobility status  GP Functional Assessment Tool Used: clinical judgement Functional Limitation: Mobility: Walking and moving around Mobility: Walking and Moving Around Current Status ([id number](G8978): At least 20 percent but less than 40 percent impaired, limited  or restricted Mobility: Walking and Moving Around Goal Status [phone](G8979): At least 1 percent but less than 20 percent impaired, limited or restricted   [facility] For Behavioral MedicineDONAWERTH,Lorieann Argueta 11/18/2013, 2:00 PM

## 2013-11-18 NOTE — Anesthesia Postprocedure Evaluation (Signed)
  Anesthesia Post-op Note  Patient: [name] HedgesLarry Pierce  Procedure(s) Performed: Procedure(s): ORIF tibia (Left) HARDWARE REMOVAL (Left)  Patient Location: PACU  Anesthesia Type:General  Level of Consciousness: awake and alert   Airway and Oxygen Therapy: Patient Spontanous Breathing  Post-op Pain: mild  Post-op Assessment: Post-op Vital signs reviewed, Patient's Cardiovascular Status Stable and Respiratory Function Stable  Post-op Vital Signs: Reviewed  Filed Vitals:   11/18/13 1319  BP:   Pulse:   Temp:   Resp: 16    Complications: No apparent anesthesia complications

## 2013-11-19 DIAGNOSIS — F1910 Other psychoactive substance abuse, uncomplicated: Secondary | ICD-10-CM

## 2013-11-19 HISTORY — DX: Other psychoactive substance abuse, uncomplicated: F19.10

## 2013-11-19 LAB — BASIC METABOLIC PANEL
BUN: 9 mg/dL (ref 6–23)
CHLORIDE: 98 meq/L (ref 96–112)
CO2: 26 meq/L (ref 19–32)
Calcium: 8.2 mg/dL — ABNORMAL LOW (ref 8.4–10.5)
Creatinine, Ser: 1.2 mg/dL (ref 0.50–1.35)
GFR calc Af Amer: 87 mL/min — ABNORMAL LOW (ref >90)
GFR calc non Af Amer: 75 mL/min — ABNORMAL LOW (ref >90)
Glucose, Bld: 141 mg/dL — ABNORMAL HIGH (ref 70–99)
Potassium: 4.6 mEq/L (ref 3.7–5.3)
SODIUM: 135 meq/L — AB (ref 137–147)

## 2013-11-19 LAB — CBC
HEMATOCRIT: 41.8 % (ref 39.0–52.0)
HEMOGLOBIN: 13.9 g/dL (ref 13.0–17.0)
MCH: 30.3 pg (ref 26.0–34.0)
MCHC: 33.3 g/dL (ref 30.0–36.0)
MCV: 91.1 fL (ref 78.0–100.0)
Platelets: 338 10*3/uL (ref 150–400)
RBC: 4.59 MIL/uL (ref 4.22–5.81)
RDW: 14.6 % (ref 11.5–15.5)
WBC: 16.6 10*3/uL — AB (ref 4.0–10.5)

## 2013-11-19 MED ORDER — DSS 100 MG PO CAPS: 100.0000 mg | ORAL_CAPSULE | Freq: Two times a day (BID) | ORAL | Status: DC

## 2013-11-19 MED ORDER — HYDROCODONE-ACETAMINOPHEN 10-325 MG PO TABS: 1.0000 | ORAL_TABLET | Freq: Four times a day (QID) | ORAL | Status: DC | PRN

## 2013-11-19 MED ORDER — OXYCODONE HCL 5 MG PO TABS: 5.0000 mg | ORAL_TABLET | ORAL | Status: DC | PRN

## 2013-11-19 NOTE — Evaluation (Addendum)
Occupational Therapy Evaluation Patient Details Name: [name] MRN: [id number] DOB: 1976/03/24 Today's Date: 11/19/2013 Time: 6213-0865 OT Time Calculation (min): 22 min  OT Assessment / Plan / Recommendation History of present illness pt s/p TIBIAL OSTEOTOMY AND CORRECTION OF MALUNION USING FEMORAL DISTRACTOR   Clinical Impression   Pt presents with below problem list. Pt was planning to d/c to girlfriend's house, but now is unable to go. Pt moving well during session. Recommending HHOT upon d/c.    OT Assessment  Patient needs continued OT Services    Follow Up Recommendations  Home health OT;Supervision - Intermittent (OOB/mobility)    Barriers to Discharge Decreased caregiver support    Equipment Recommendations  3 in 1 bedside comode    Recommendations for Other Services    Frequency  Min 2X/week    Precautions / Restrictions Precautions Precautions: Fall Restrictions Weight Bearing Restrictions: Yes LLE Weight Bearing: Touchdown weight bearing   Pertinent Vitals/Pain Pain 7/10 in LLE. Nurse gave meds during session.     ADL  Upper Body Dressing: Set up Where Assessed - Upper Body Dressing: Supported sitting Lower Body Dressing: Min guard;Set up (Min guard-sit to stand and Setup-leaning) Where Assessed - Lower Body Dressing: Supported sit to stand;Lean right and/or left Toilet Transfer: Personnel officer Method: Sit to Barista: Comfort height toilet;Bedside commode;Grab bars Equipment Used: Gait belt;Rolling walker Transfers/Ambulation Related to ADLs: Min guard for ambulation. Min guard/supervision for transfers. ADL Comments: Discussed safety with pt. Discussed use of bag on walker  to carry items. Spoke about dressing (different positions to perform dressing) and if pt stands to pull up LB clothing, recommended having chair/bed behind him with walker in front. Demonstrated safe tub transfer techniques as well as  shower transfer technique.     OT Diagnosis: Acute pain  OT Problem List: Decreased knowledge of use of DME or AE;Decreased safety awareness;Pain;Decreased range of motion;Impaired balance (sitting and/or standing) OT Treatment Interventions: Self-care/ADL training;DME and/or AE instruction;Therapeutic activities;Patient/family education;Balance training   OT Goals(Current goals can be found in the care plan section) Acute Rehab OT Goals Patient Stated Goal: wants more therapy OT Goal Formulation: With patient Time For Goal Achievement: 11/26/13 Potential to Achieve Goals: Good ADL Goals Pt Will Perform Lower Body Dressing: with modified independence;sit to/from stand;bed level Pt Will Transfer to Toilet: with modified independence;ambulating Pt Will Perform Toileting - Clothing Manipulation and hygiene: with modified independence;sit to/from stand  Visit Information  Last OT Received On: 11/19/13 Assistance Needed: +1 History of Present Illness: pt s/p TIBIAL OSTEOTOMY AND CORRECTION OF MALUNION USING FEMORAL DISTRACTOR       Prior Functioning     Home Living Family/patient expects to be discharged to:: Private residence Living Arrangements: Spouse/significant other Available Help at Discharge: Friend(s) Type of Home: Apartment Home Access: Level entry Home Layout: One level Home Equipment: Environmental consultant - 2 wheels;Wheelchair - manual Additional Comments: pt was planning to stay with gf-pt unsure where he is going to d/c-says he is now homeless Prior Function Level of Independence: Independent with assistive device(s) Communication Communication: No difficulties Dominant Hand: Right         Vision/Perception     Cognition  Cognition Arousal/Alertness: Awake/alert Behavior During Therapy: WFL for tasks assessed/performed Overall Cognitive Status: Within Functional Limits for tasks assessed    Extremity/Trunk Assessment Upper Extremity Assessment Upper Extremity  Assessment: Overall WFL for tasks assessed Lower Extremity Assessment Lower Extremity Assessment: Defer to PT evaluation     Mobility  Transfers  Overall transfer level: Needs assistance Equipment used: Rolling walker (2 wheeled) Transfers: Sit to/from Stand Sit to Stand: Min guard;Supervision Stand pivot transfers: Supervision General transfer comment: cues for hand placement and safe technique.        Balance     End of Session OT - End of Session Equipment Utilized During Treatment: Gait belt;Rolling walker Activity Tolerance: Patient tolerated treatment well Patient left: in chair;with call bell/phone within reach;with chair alarm set Nurse Communication:  (nurse student came in during session)  GO Functional Assessment Tool Used: clinical judgment Functional Limitation: Self care Self Care Current Status ([id number](G8987): At least 1 percent but less than 20 percent impaired, limited or restricted Self Care Goal Status (U0454(G8988): 0 percent impaired, limited or restricted   Earlie RavelingStraub, Matrice Herro L OTR/L [phone] 11/19/2013, 12:52 PM

## 2013-11-19 NOTE — Discharge Instructions (Signed)
[facility] Discharge Instructions   General Discharge Instructions  WEIGHT BEARING STATUS: Touchdown weight bearing Left leg  RANGE OF MOTION/ACTIVITY: Range of motion as tolerated Left knee and ankle. Use AFO once you can get your shoe on   Diet: as you were eating previously.  Can use over the counter stool softeners and bowel preparations, such as Miralax, to help with bowel movements.  Narcotics can be constipating.  Be sure to drink plenty of fluids  STOP SMOKING OR USING NICOTINE PRODUCTS!!!!  As discussed nicotine severely impairs your body's ability to heal surgical and traumatic wounds but also impairs bone healing.  Wounds and bone heal by forming microscopic blood vessels (angiogenesis) and nicotine is a vasoconstrictor (essentially, shrinks blood vessels).  Therefore, if vasoconstriction occurs to these microscopic blood vessels they essentially disappear and are unable to deliver necessary nutrients to the healing tissue.  This is one modifiable factor that you can do to dramatically increase your chances of healing your injury.    (This means no smoking, no nicotine gum, patches, etc)  DO NOT USE NONSTEROIDAL ANTI-INFLAMMATORY DRUGS (NSAID'S)  Using products such as Advil (ibuprofen), Aleve (naproxen), Motrin (ibuprofen) for additional pain control during fracture healing can delay and/or prevent the healing response.  If you would like to take over the counter (OTC) medication, Tylenol (acetaminophen) is ok.  However, some narcotic medications that are given for pain control contain acetaminophen as well. Therefore, you should not exceed more than 4000 mg of tylenol in a day if you do not have liver disease.  Also note that there are may OTC medicines, such as cold medicines and allergy medicines that my contain tylenol as well.  If you have any questions about medications and/or interactions please ask your doctor/PA or your pharmacist.   PAIN MEDICATION USE AND  EXPECTATIONS  You have likely been given narcotic medications to help control your pain.  After a traumatic event that results in an fracture (broken bone) with or without surgery, it is ok to use narcotic pain medications to help control one's pain.  We understand that everyone responds to pain differently and each individual patient will be evaluated on a regular basis for the continued need for narcotic medications. Ideally, narcotic medication use should last no more than 6-8 weeks (coinciding with fracture healing).   As a patient it is your responsibility as well to monitor narcotic medication use and report the amount and frequency you use these medications when you come to your office visit.   We would also advise that if you are using narcotic medications, you should take a dose prior to therapy to maximize you participation.  IF YOU ARE ON NARCOTIC MEDICATIONS IT IS NOT PERMISSIBLE TO OPERATE A MOTOR VEHICLE (MOTORCYCLE/CAR/TRUCK/MOPED) OR HEAVY MACHINERY DO NOT MIX NARCOTICS WITH OTHER CNS (CENTRAL NERVOUS SYSTEM) DEPRESSANTS SUCH AS ALCOHOL       ICE AND ELEVATE INJURED/OPERATIVE EXTREMITY  Using ice and elevating the injured extremity above your heart can help with swelling and pain control.  Icing in a pulsatile fashion, such as 20 minutes on and 20 minutes off, can be followed.    Do not place ice directly on skin. Make sure there is a barrier between to skin and the ice pack.    Using frozen items such as frozen peas works well as the conform nicely to the are that needs to be iced.  USE AN ACE WRAP OR TED HOSE FOR SWELLING CONTROL  In addition to  icing and elevation, Ace wraps or TED hose are used to help limit and resolve swelling.  It is recommended to use Ace wraps or TED hose until you are informed to stop.    When using Ace Wraps start the wrapping distally (farthest away from the body) and wrap proximally (closer to the body)   Example: If you had surgery on your leg or  thing and you do not have a splint on, start the ace wrap at the toes and work your way up to the thigh        If you had surgery on your upper extremity and do not have a splint on, start the ace wrap at your fingers and work your way up to the upper arm  IF YOU ARE IN A SPLINT OR CAST DO NOT REMOVE IT FOR ANY REASON   If your splint gets wet for any reason please contact the office immediately. You may shower in your splint or cast as long as you keep it dry.  This can be done by wrapping in a cast cover or garbage back (or similar)  Do Not stick any thing down your splint or cast such as pencils, money, or hangers to try and scratch yourself with.  If you feel itchy take benadryl as prescribed on the bottle for itching  IF YOU ARE IN A CAM BOOT (BLACK BOOT)  You may remove boot periodically. Perform daily dressing changes as noted below.  Wash the liner of the boot regularly and wear a sock when wearing the boot. It is recommended that you sleep in the boot until told otherwise  CALL THE OFFICE WITH ANY QUESTIONS OR CONCERTS: [phone]     Discharge Pin Site Instructions  Dress pins daily with Kerlix roll starting on POD 2. Wrap the Kerlix so that it tamps the skin down around the pin-skin interface to prevent/limit motion of the skin relative to the pin.  (Pin-skin motion is the primary cause of pain and infection related to external fixator pin sites).  Remove any crust or coagulum that may obstruct drainage with a saline moistened gauze or soap and water.  After POD 3, if there is no discernable drainage on the pin site dressing, the interval for change can by increased to every other day.  You may shower with the fixator, cleaning all pin sites gently with soap and water.  If you have a surgical wound this needs to be completely dry and without drainage before showering.  The extremity can be lifted by the fixator to facilitate wound care and transfers.  Notify the office/Doctor  if you experience increasing drainage, redness, or pain from a pin site, or if you notice purulent (thick, snot-like) drainage.  Discharge Wound Care Instructions  Do NOT apply any ointments, solutions or lotions to pin sites or surgical wounds.  These prevent needed drainage and even though solutions like hydrogen peroxide kill bacteria, they also damage cells lining the pin sites that help fight infection.  Applying lotions or ointments can keep the wounds moist and can cause them to breakdown and open up as well. This can increase the risk for infection. When in doubt call the office.  Surgical incisions should be dressed daily.  If any drainage is noted, use one layer of adaptic, then gauze, Kerlix, and an ace wrap.  Once the incision is completely dry and without drainage, it may be left open to air out.  Showering may begin 36-48 hours later.  Cleaning gently with soap and water.  Traumatic wounds should be dressed daily as well.    One layer of adaptic, gauze, Kerlix, then ace wrap.  The adaptic can be discontinued once the draining has ceased    If you have a wet to dry dressing: wet the gauze with saline the squeeze as much saline out so the gauze is moist (not soaking wet), place moistened gauze over wound, then place a dry gauze over the moist one, followed by Kerlix wrap, then ace wrap.

## 2013-11-19 NOTE — Discharge Summary (Signed)
[facility] (OTS)  Patient ID: [name] MRN: [id number] DOB/AGE: 12/07/1975 38 y.o.  Admit date: 11/18/2013 Discharge date: 11/19/2013  Admission Diagnoses: Nonunion Left tibia GERD L foot drop Neuropathy L lower extremity  Chronic back pain  Polysubstance abuse EtOH dependence   Discharge Diagnoses:  Active Problems:   Chronic back pain   Neuropathy of left lower extremity   GERD (gastroesophageal reflux disease)   Left foot drop   Alcohol dependence   Major depression   Nonunion of fracture   Polysubstance abuse   Procedures Performed: 11/18/2012- Dr. Carola Frost 1. Repair of nonunion with Synthes plate and graft.  2. Tibial osteotomy and correction of malunion using femoral  distractor.  3. Removal of broken hardware   Discharged Condition: good  Hospital Course:  Admitted overnight for pain control and observation.  Stable on POD1, dc to home stable condition   Consults: None  Significant Diagnostic Studies: labs:   Results for JAKORI, BURKETT (MRN [id number]) as of 11/19/2013 13:18  Ref. Range 11/19/2013 05:27  Sodium Latest Range: 137-147 mEq/L 135 (L)  Potassium Latest Range: 3.7-5.3 mEq/L 4.6  Chloride Latest Range: 96-112 mEq/L 98  CO2 Latest Range: 19-32 mEq/L 26  BUN Latest Range: 6-23 mg/dL 9  Creatinine Latest Range: 0.50-1.35 mg/dL 7.82  Calcium Latest Range: 8.4-10.5 mg/dL 8.2 (L)  GFR calc non Af Amer Latest Range: >90 mL/min 75 (L)  GFR calc Af Amer Latest Range: >90 mL/min 87 (L)  Glucose Latest Range: 70-99 mg/dL 956 (H)  WBC Latest Range: 4.0-10.5 K/uL 16.6 (H)  RBC Latest Range: 4.22-5.81 MIL/uL 4.59  Hemoglobin Latest Range: 13.0-17.0 g/dL 21.3  HCT Latest Range: 39.0-52.0 % 41.8  MCV Latest Range: 78.0-100.0 fL 91.1  MCH Latest Range: 26.0-34.0 pg 30.3  MCHC Latest Range: 30.0-36.0 g/dL 08.6  RDW Latest Range: 11.5-15.5 % 14.6  Platelets Latest Range: 150-400 K/uL 338    Treatments: IV hydration, antibiotics:  vancomycin, analgesia: Dilaudid and Norco, oxycodone, anticoagulation: LMW heparin, therapies: PT, OT, RN and SW and surgery: as above   Discharge Exam:  Orthopaedic Trauma Service Progress Note  Subjective  Doing fine from ortho standpoint Has no place to go after dc per his report   Kicked out of parents house     Objective   BP 129/60  Pulse 121  Temp(Src) 98.5 F (36.9 C) (Oral)  Resp 20  Ht 5\' 8"  (1.727 m)  Wt 86.183 kg (190 lb)  BMI 28.90 kg/m2  SpO2 98%  Intake/Output     02/13 0701 - 02/14 0700 02/14 0701 - 02/15 0700    P.O. 240     I.V. (mL/kg) 1100 (12.8)     Total Intake(mL/kg) 1340 (15.5)     Urine (mL/kg/hr) 1755 (0.8)     Blood 150 (0.1)     Total Output 1905      Net -565              Labs Results for ANUEL, SITTER (MRN [id number]) as of 11/19/2013 10:18   Ref. Range  11/19/2013 05:27   Sodium  Latest Range: 137-147 mEq/L  135 (L)   Potassium  Latest Range: 3.7-5.3 mEq/L  4.6   Chloride  Latest Range: 96-112 mEq/L  98   CO2  Latest Range: 19-32 mEq/L  26   BUN  Latest Range: 6-23 mg/dL  9   Creatinine  Latest Range: 0.50-1.35 mg/dL  5.28   Calcium  Latest Range: 8.4-10.5 mg/dL  8.2 (L)  GFR calc non Af Amer  Latest Range: >90 mL/min  75 (L)   GFR calc Af Amer  Latest Range: >90 mL/min  87 (L)   Glucose  Latest Range: 70-99 mg/dL  440 (H)   WBC  Latest Range: 4.0-10.5 K/uL  16.6 (H)   RBC  Latest Range: 4.22-5.81 MIL/uL  4.59   Hemoglobin  Latest Range: 13.0-17.0 g/dL  10.2   HCT  Latest Range: 39.0-52.0 %  41.8   MCV  Latest Range: 78.0-100.0 fL  91.1   MCH  Latest Range: 26.0-34.0 pg  30.3   MCHC  Latest Range: 30.0-36.0 g/dL  72.5   RDW  Latest Range: 11.5-15.5 %  14.6   Platelets  Latest Range: 150-400 K/uL  338    Results for COXDamario, Gillie (MRN [id number]) as of 11/19/2013 10:18   Ref. Range  11/18/2013 22:55   Amphetamines  Latest Range: NONE DETECTED   NONE DETECTED   Barbiturates  Latest Range: NONE DETECTED   NONE DETECTED   Benzodiazepines   Latest Range: NONE DETECTED   POSITIVE (A)   Opiates  Latest Range: NONE DETECTED   POSITIVE (A)   COCAINE  Latest Range: NONE DETECTED   NONE DETECTED   Tetrahydrocannabinol  Latest Range: NONE DETECTED   POSITIVE (A)     Exam  Gen: awake and alert, sitting in bedside chair on telephone Lungs: clear anterior fields Cardiac: reg Ext:        Left Lower Extremity               Dressing c/d/i             Swelling stable             + DP pulse             Motor and sensory functions at baseline             Compartments soft and nontender    Assessment and Plan   POD/HD#: 1   38 y/o male s/p repair of L tibia nonunion   1. L tibia nonunion s/p ORIF             TDWB x 4-6 weeks             Unrestricted ROM L knee             Ice and elevate             PT/OT evals             Do not think pt needs SNF             Dressing change tomorrow   2. GERD             protonix  3. DVT/PE prophylaxis             lovenox while inpt             Will not require pharmacologics at discharge  4. Substance abuse             Discussed again negative effects of nicotine on wound and bone healing             Pt with + tox screen. Tox screen was done post op and pt had received ativan prior to tox screen so that explains positive test for opiates and benzos, test + for marijuana   5. Pain management  Transition to po meds today  6. FEN             Advance diet as tolerated  7. Dispo:             PT/OT evals pending             SW consult             Anticipate dc tomorrow if pain controlled and mobilizing well      Mearl Latin, PA-C [facility] [phone] (P) 11/19/2013 10:04 AM   After discussion with pt, sw and family pt was able to dc to parents home on 11/19/2013.       Disposition: 01-Home or Self Care  Discharge Orders   Future Appointments Provider Department Dept Phone   12/26/2013 2:00 PM [facility] [address] [phone]   12/26/2013 2:30 PM [facility] [address] [phone]   12/26/2013 3:00 PM Carma Lair Nickel, NP [facility] [phone]   Future Orders Complete By Expires   Call MD / Call 911  As directed    Comments:     If you experience chest pain or shortness of breath, CALL 911 and be transported to the hospital emergency room.  If you develope a fever above 101 F, pus (white drainage) or increased drainage or redness at the wound, or calf pain, call your surgeon's office.   Constipation Prevention  As directed    Comments:     Drink plenty of fluids.  Prune juice may be helpful.  You may use a stool softener, such as Colace (over the counter) 100 mg twice a day.  Use MiraLax (over the counter) for constipation as needed.   Diet general  As directed    Discharge instructions  As directed    Comments:     Orthopaedic Trauma Service Discharge Instructions   General Discharge Instructions  WEIGHT BEARING STATUS: Touchdown weight bearing Left leg  RANGE OF MOTION/ACTIVITY: Range of motion as tolerated Left knee and ankle. Use AFO once you can get your shoe on   Diet: as you were eating previously.  Can use over the counter stool softeners and bowel preparations, such as Miralax, to help with bowel movements.  Narcotics can be constipating.  Be sure to drink plenty of fluids  STOP SMOKING OR USING NICOTINE PRODUCTS!!!!  As discussed nicotine severely impairs your body's ability to heal surgical and traumatic wounds but also impairs bone healing.  Wounds and bone heal by forming microscopic blood vessels (angiogenesis) and nicotine is a vasoconstrictor (essentially, shrinks blood vessels).  Therefore, if vasoconstriction occurs to these microscopic blood vessels they essentially disappear and are unable to deliver necessary nutrients to the healing tissue.  This is one modifiable factor that you can do to dramatically  increase your chances of healing your injury.    (This means no smoking, no nicotine gum, patches, etc)  DO NOT USE NONSTEROIDAL ANTI-INFLAMMATORY DRUGS (NSAID'S)  Using products such as Advil (ibuprofen), Aleve (naproxen), Motrin (ibuprofen) for additional pain control during fracture healing can delay and/or prevent the healing response.  If you would like to take over the counter (OTC) medication, Tylenol (acetaminophen) is ok.  However, some narcotic medications that are given for pain control contain acetaminophen as well. Therefore, you should not exceed more than 4000 mg of tylenol in a day if you do not have liver disease.  Also note that there  are may OTC medicines, such as cold medicines and allergy medicines that my contain tylenol as well.  If you have any questions about medications and/or interactions please ask your doctor/PA or your pharmacist.   PAIN MEDICATION USE AND EXPECTATIONS  You have likely been given narcotic medications to help control your pain.  After a traumatic event that results in an fracture (broken bone) with or without surgery, it is ok to use narcotic pain medications to help control one's pain.  We understand that everyone responds to pain differently and each individual patient will be evaluated on a regular basis for the continued need for narcotic medications. Ideally, narcotic medication use should last no more than 6-8 weeks (coinciding with fracture healing).   As a patient it is your responsibility as well to monitor narcotic medication use and report the amount and frequency you use these medications when you come to your office visit.   We would also advise that if you are using narcotic medications, you should take a dose prior to therapy to maximize you participation.  IF YOU ARE ON NARCOTIC MEDICATIONS IT IS NOT PERMISSIBLE TO OPERATE A MOTOR VEHICLE (MOTORCYCLE/CAR/TRUCK/MOPED) OR HEAVY MACHINERY DO NOT MIX NARCOTICS WITH OTHER CNS (CENTRAL NERVOUS  SYSTEM) DEPRESSANTS SUCH AS ALCOHOL       ICE AND ELEVATE INJURED/OPERATIVE EXTREMITY  Using ice and elevating the injured extremity above your heart can help with swelling and pain control.  Icing in a pulsatile fashion, such as 20 minutes on and 20 minutes off, can be followed.    Do not place ice directly on skin. Make sure there is a barrier between to skin and the ice pack.    Using frozen items such as frozen peas works well as the conform nicely to the are that needs to be iced.  USE AN ACE WRAP OR TED HOSE FOR SWELLING CONTROL  In addition to icing and elevation, Ace wraps or TED hose are used to help limit and resolve swelling.  It is recommended to use Ace wraps or TED hose until you are informed to stop.    When using Ace Wraps start the wrapping distally (farthest away from the body) and wrap proximally (closer to the body)   Example: If you had surgery on your leg or thing and you do not have a splint on, start the ace wrap at the toes and work your way up to the thigh        If you had surgery on your upper extremity and do not have a splint on, start the ace wrap at your fingers and work your way up to the upper arm  IF YOU ARE IN A SPLINT OR CAST DO NOT REMOVE IT FOR ANY REASON   If your splint gets wet for any reason please contact the office immediately. You may shower in your splint or cast as long as you keep it dry.  This can be done by wrapping in a cast cover or garbage back (or similar)  Do Not stick any thing down your splint or cast such as pencils, money, or hangers to try and scratch yourself with.  If you feel itchy take benadryl as prescribed on the bottle for itching  IF YOU ARE IN A CAM BOOT (BLACK BOOT)  You may remove boot periodically. Perform daily dressing changes as noted below.  Wash the liner of the boot regularly and wear a sock when wearing the boot. It is recommended that you sleep  in the boot until told otherwise  CALL THE OFFICE WITH ANY QUESTIONS  OR CONCERTS: [phone]     Discharge Pin Site Instructions  Dress pins daily with Kerlix roll starting on POD 2. Wrap the Kerlix so that it tamps the skin down around the pin-skin interface to prevent/limit motion of the skin relative to the pin.  (Pin-skin motion is the primary cause of pain and infection related to external fixator pin sites).  Remove any crust or coagulum that may obstruct drainage with a saline moistened gauze or soap and water.  After POD 3, if there is no discernable drainage on the pin site dressing, the interval for change can by increased to every other day.  You may shower with the fixator, cleaning all pin sites gently with soap and water.  If you have a surgical wound this needs to be completely dry and without drainage before showering.  The extremity can be lifted by the fixator to facilitate wound care and transfers.  Notify the office/Doctor if you experience increasing drainage, redness, or pain from a pin site, or if you notice purulent (thick, snot-like) drainage.  Discharge Wound Care Instructions  Do NOT apply any ointments, solutions or lotions to pin sites or surgical wounds.  These prevent needed drainage and even though solutions like hydrogen peroxide kill bacteria, they also damage cells lining the pin sites that help fight infection.  Applying lotions or ointments can keep the wounds moist and can cause them to breakdown and open up as well. This can increase the risk for infection. When in doubt call the office.  Surgical incisions should be dressed daily.  If any drainage is noted, use one layer of adaptic, then gauze, Kerlix, and an ace wrap.  Once the incision is completely dry and without drainage, it may be left open to air out.  Showering may begin 36-48 hours later.  Cleaning gently with soap and water.  Traumatic wounds should be dressed daily as well.    One layer of adaptic, gauze, Kerlix, then ace wrap.  The adaptic can be  discontinued once the draining has ceased    If you have a wet to dry dressing: wet the gauze with saline the squeeze as much saline out so the gauze is moist (not soaking wet), place moistened gauze over wound, then place a dry gauze over the moist one, followed by Kerlix wrap, then ace wrap.   Driving restrictions  As directed    Comments:     No driving   Increase activity slowly as tolerated  As directed    Touch down weight bearing  As directed    Questions:     Laterality:     Extremity:         Medication List         amitriptyline 50 MG tablet  Commonly known as:  ELAVIL  Take 1 tablet (50 mg total) by mouth at bedtime. For depression/insomnia     baclofen 10 MG tablet  Commonly known as:  LIORESAL  Take 1 tablet (10 mg total) by mouth 3 (three) times daily. For muscle pain/spasms     doxepin 150 MG capsule  Commonly known as:  SINEQUAN  Take 1 capsule (150 mg total) by mouth at bedtime. For     DSS 100 MG Caps  Take 100 mg by mouth 2 (two) times daily.     esomeprazole 40 MG capsule  Commonly known as:  NEXIUM  Take 1 capsule (  40 mg total) by mouth daily. For acid reflux     gabapentin 300 MG capsule  Commonly known as:  NEURONTIN  Take 600 mg by mouth 3 (three) times daily.     HYDROcodone-acetaminophen 10-325 MG per tablet  Commonly known as:  NORCO  Take 1-2 tablets by mouth every 6 (six) hours as needed for moderate pain or severe pain.     hydrOXYzine 50 MG tablet  Commonly known as:  ATARAX/VISTARIL  Take 50 mg by mouth at bedtime.     oxyCODONE 5 MG immediate release tablet  Commonly known as:  Oxy IR/ROXICODONE  Take 1-2 tablets (5-10 mg total) by mouth every 3 (three) hours as needed for breakthrough pain (take between hydrocodone for breakthrough pain).           Follow-up Information   Follow up with HANDY,MICHAEL H, MD. Schedule an appointment as soon as possible for a visit in 2 weeks. (For suture removal)    Specialty:  Orthopedic  Surgery   Contact information:   [address] [place] [address] [phone]       Follow up with Myra Rude, MD. Schedule an appointment as soon as possible for a visit in 2 weeks.   Specialty:  Family Medicine   Contact information:   [address] [place] [address] [phone]       Discharge Instructions and Plan:  NWB L leg ROM as tolerated Left knee Daily dressing changes Reviewed wound care with pt PRAFO for foot drop Pt is mobile and does not require pharmacologic DVT/PE prophylaxis   Follow up in 10 days at the office   Signed:  Mearl Latin, PA-C [facility] [phone] (P) 11/19/2013, 1:17 PM

## 2013-11-19 NOTE — Progress Notes (Signed)
UR Completed.  [name] [phone] 11/19/2013  

## 2013-11-19 NOTE — Progress Notes (Signed)
Orthopaedic Trauma Service Progress Note  Subjective  Doing fine from ortho standpoint Has no place to go after dc per his report  Kicked out of parents house     Objective   BP 129/60  Pulse 121  Temp(Src) 98.5 F (36.9 C) (Oral)  Resp 20  Ht 5\' 8"  (1.727 m)  Wt 86.183 kg (190 lb)  BMI 28.90 kg/m2  SpO2 98%  Intake/Output     02/13 0701 - 02/14 0700 02/14 0701 - 02/15 0700   P.O. 240    I.V. (mL/kg) 1100 (12.8)    Total Intake(mL/kg) 1340 (15.5)    Urine (mL/kg/hr) 1755 (0.8)    Blood 150 (0.1)    Total Output 1905     Net -565            Labs Results for [name] (MRN [id number]) as of 11/19/2013 10:18  Ref. Range 11/19/2013 05:27  Sodium Latest Range: 137-147 mEq/L 135 (L)  Potassium Latest Range: 3.7-5.3 mEq/L 4.6  Chloride Latest Range: 96-112 mEq/L 98  CO2 Latest Range: 19-32 mEq/L 26  BUN Latest Range: 6-23 mg/dL 9  Creatinine Latest Range: 0.50-1.35 mg/dL 4.091.20  Calcium Latest Range: 8.4-10.5 mg/dL 8.2 (L)  GFR calc non Af Amer Latest Range: >90 mL/min 75 (L)  GFR calc Af Amer Latest Range: >90 mL/min 87 (L)  Glucose Latest Range: 70-99 mg/dL 811141 (H)  WBC Latest Range: 4.0-10.5 K/uL 16.6 (H)  RBC Latest Range: 4.22-5.81 MIL/uL 4.59  Hemoglobin Latest Range: 13.0-17.0 g/dL 91.413.9  HCT Latest Range: 39.0-52.0 % 41.8  MCV Latest Range: 78.0-100.0 fL 91.1  MCH Latest Range: 26.0-34.0 pg 30.3  MCHC Latest Range: 30.0-36.0 g/dL 78.233.3  RDW Latest Range: 11.5-15.5 % 14.6  Platelets Latest Range: 150-400 K/uL 338   Results for [name] (MRN [id number]) as of 11/19/2013 10:18  Ref. Range 11/18/2013 22:55  Amphetamines Latest Range: NONE DETECTED  NONE DETECTED  Barbiturates Latest Range: NONE DETECTED  NONE DETECTED  Benzodiazepines Latest Range: NONE DETECTED  POSITIVE (A)  Opiates Latest Range: NONE DETECTED  POSITIVE (A)  COCAINE Latest Range: NONE DETECTED  NONE DETECTED  Tetrahydrocannabinol Latest Range: NONE DETECTED  POSITIVE (A)    Exam  Gen:  awake and alert, sitting in bedside chair on telephone Lungs: clear anterior fields Cardiac: reg Ext:       Left Lower Extremity   Dressing c/d/i  Swelling stable  + DP pulse  Motor and sensory functions at baseline  Compartments soft and nontender    Assessment and Plan   POD/HD#: 1   38 y/o male s/p repair of L tibia nonunion   1. L tibia nonunion s/p ORIF  TDWB x 4-6 weeks  Unrestricted ROM L knee  Ice and elevate  PT/OT evals  Do not think pt needs SNF  Dressing change tomorrow   2. GERD  protonix  3. DVT/PE prophylaxis  lovenox while inpt  Will not require pharmacologics at discharge  4. Substance abuse  Discussed again negative effects of nicotine on wound and bone healing  Pt with + tox screen. Tox screen was done post op and pt had received ativan prior to tox screen so that explains positive test for opiates and benzos, test + for marijuana   5. Pain management  Transition to po meds today  6. FEN  Advance diet as tolerated  7. Dispo:  PT/OT evals pending  SW consult  Anticipate dc tomorrow if pain controlled and mobilizing well     Mellody DanceKeith  Clarene Critchley, PA-C [facility] [phone] (P) 11/19/2013 10:04 AM
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
## 2013-11-19 NOTE — Progress Notes (Signed)
Orthopedic Tech Progress Note Patient Details:  [name] HedgesLarry Pierce 08-01-1976 [id number] Prafo boot applied to Left LE. Application and use instructions explained.   Ortho Devices Type of Ortho Device: Other (comment) Ortho Device/Splint Location: Left PRAFO  Ortho Device/Splint Interventions: Application   Asia R Thompson 11/19/2013, 12:26 PM

## 2013-11-19 NOTE — Progress Notes (Signed)
MD, patient has stated several times during this shift that his girlfriend will no longer allow him to stay at her home.  He is concerned about being discharged from the hospital without having a place to go.

## 2013-11-19 NOTE — Progress Notes (Signed)
Clinical Education officer, museum (CSW) received call from Chief Strategy Officer stating that patient is homeless. CSW met with patient who reported that he can no longer live with his girlfriend and his parents won't let him stay with them. Patient reported that he has no other friends or family that he can stay with. CSW offered patient a list of shelters however patient reported that a shelter would not be a good option because he is in a wheel chair and uses a walker. CSW encouraged patient to talk with parents about letting him stay with them. RN case manager met with patient as well and reported to CSW that patient's mother has agreed to let patient stay with her and she is coming to pick him up from [facility] at 8 pm tonight. Please reconsult if further social work needs arise. CSW signing off.

## 2013-11-19 NOTE — Progress Notes (Signed)
Physical Therapy Treatment Patient Details Name: [name] MRN: [id number] DOB: 01-01-1976 Today's Date: 11/19/2013 Time: [id number] PT Time Calculation (min): 18 min  PT Assessment / Plan / Recommendation  History of Present Illness pt s/p TIBIAL OSTEOTOMY AND CORRECTION OF MALUNION USING FEMORAL DISTRACTOR   PT Comments   Pt now with no d/c plan, unable to go to girlfriend's house.  Pt will benefit from HHPT for safety eval if d/c home alone.  Follow Up Recommendations  Home health PT (Pt with change in d/c plan, will benefit from HHPT for safety eval)     Does the patient have the potential to tolerate intense rehabilitation     Barriers to Discharge        Equipment Recommendations  None recommended by PT    Recommendations for Other Services    Frequency Min 5X/week   Progress towards PT Goals Progress towards PT goals: Progressing toward goals  Plan Discharge plan needs to be updated    Precautions / Restrictions Restrictions Weight Bearing Restrictions: Yes LLE Weight Bearing: Touchdown weight bearing   Pertinent Vitals/Pain No c/o pain during session   Mobility  Bed Mobility Overal bed mobility: Modified Independent Transfers Equipment used: Rolling walker (2 wheeled) Transfers: Stand Pivot Transfers Stand pivot transfers: Supervision General transfer comment: pt able to maintain TDWB without cues Ambulation/Gait Ambulation/Gait assistance: Supervision Ambulation Distance (Feet): 150 Feet Assistive device: Rolling walker (2 wheeled) General Gait Details: no LOB, cues for safety with turns    Exercises Total Joint Exercises Long Arc Quad: AROM;Left;20 reps General Exercises - Lower Extremity Hip Flexion/Marching: AROM;Left;20 reps   PT Diagnosis:    PT Problem List:   PT Treatment Interventions:     PT Goals (current goals can now be found in the care plan section)    Visit Information  Last PT Received On: 11/19/13 Assistance Needed: +1 History of  Present Illness: pt s/p TIBIAL OSTEOTOMY AND CORRECTION OF MALUNION USING FEMORAL DISTRACTOR    Subjective Data      Cognition  Cognition Arousal/Alertness: Awake/alert Behavior During Therapy: WFL for tasks assessed/performed Overall Cognitive Status: Within Functional Limits for tasks assessed    Balance     End of Session PT - End of Session Activity Tolerance: Patient tolerated treatment well Patient left: in chair;with call bell/phone within reach;with chair alarm set   GP Functional Assessment Tool Used: clinical judgement Mobility: Walking and Moving Around Current Status ([id number]([id number]): At least 20 percent but less than 40 percent impaired, limited or restricted Mobility: Walking and Moving Around Goal Status [phone](G8979): At least 1 percent but less than 20 percent impaired, limited or restricted   [facility] HospitalDONAWERTH,[name] 11/19/2013, 11:24 AM

## 2013-11-20 LAB — WOUND CULTURE: CULTURE: NO GROWTH

## 2013-11-20 NOTE — Progress Notes (Signed)
Agree with above. Patient able to work out conflicts with parents and return home.  Myrene GalasMichael Amariz Flamenco, MD [facility] [phone] (p)

## 2013-11-21 ENCOUNTER — Other Ambulatory Visit: Payer: Self-pay | Admitting: Surgery

## 2013-11-21 DIAGNOSIS — S85009A Unspecified injury of popliteal artery, unspecified leg, initial encounter: Secondary | ICD-10-CM

## 2013-11-21 DIAGNOSIS — Z48812 Encounter for surgical aftercare following surgery on the circulatory system: Secondary | ICD-10-CM

## 2013-11-22 ENCOUNTER — Encounter (HOSPITAL_COMMUNITY): Payer: Self-pay | Admitting: Orthopedic Surgery

## 2013-11-22 LAB — ANAEROBIC CULTURE: Gram Stain: NONE SEEN

## 2013-11-30 ENCOUNTER — Encounter (HOSPITAL_COMMUNITY): Payer: Self-pay | Admitting: Orthopedic Surgery

## 2013-11-30 ENCOUNTER — Inpatient Hospital Stay (HOSPITAL_COMMUNITY)
Admission: AD | Admit: 2013-11-30 | Discharge: 2013-12-06 | DRG: 920 | Disposition: A | Discharge status: Home Health | Payer: Medicare Other | Source: Ambulatory Visit | Attending: Orthopedic Surgery | Admitting: Orthopedic Surgery

## 2013-11-30 DIAGNOSIS — Z888 Allergy status to other drugs, medicaments and biological substances status: Secondary | ICD-10-CM

## 2013-11-30 DIAGNOSIS — M25519 Pain in unspecified shoulder: Secondary | ICD-10-CM | POA: Diagnosis present

## 2013-11-30 DIAGNOSIS — T8130XA Disruption of wound, unspecified, initial encounter: Principal | ICD-10-CM | POA: Diagnosis present

## 2013-11-30 DIAGNOSIS — F1020 Alcohol dependence, uncomplicated: Secondary | ICD-10-CM | POA: Diagnosis present

## 2013-11-30 DIAGNOSIS — Z8711 Personal history of peptic ulcer disease: Secondary | ICD-10-CM

## 2013-11-30 DIAGNOSIS — G5792 Unspecified mononeuropathy of left lower limb: Secondary | ICD-10-CM | POA: Diagnosis present

## 2013-11-30 DIAGNOSIS — Z79899 Other long term (current) drug therapy: Secondary | ICD-10-CM

## 2013-11-30 DIAGNOSIS — T8140XA Infection following a procedure, unspecified, initial encounter: Secondary | ICD-10-CM | POA: Diagnosis present

## 2013-11-30 DIAGNOSIS — M549 Dorsalgia, unspecified: Secondary | ICD-10-CM

## 2013-11-30 DIAGNOSIS — M545 Low back pain, unspecified: Secondary | ICD-10-CM | POA: Diagnosis present

## 2013-11-30 DIAGNOSIS — M216X9 Other acquired deformities of unspecified foot: Secondary | ICD-10-CM | POA: Diagnosis present

## 2013-11-30 DIAGNOSIS — K219 Gastro-esophageal reflux disease without esophagitis: Secondary | ICD-10-CM | POA: Diagnosis present

## 2013-11-30 DIAGNOSIS — Y831 Surgical operation with implant of artificial internal device as the cause of abnormal reaction of the patient, or of later complication, without mention of misadventure at the time of the procedure: Secondary | ICD-10-CM | POA: Diagnosis present

## 2013-11-30 DIAGNOSIS — F17200 Nicotine dependence, unspecified, uncomplicated: Secondary | ICD-10-CM | POA: Diagnosis present

## 2013-11-30 DIAGNOSIS — Z880 Allergy status to penicillin: Secondary | ICD-10-CM

## 2013-11-30 DIAGNOSIS — G8929 Other chronic pain: Secondary | ICD-10-CM | POA: Diagnosis present

## 2013-11-30 DIAGNOSIS — F411 Generalized anxiety disorder: Secondary | ICD-10-CM | POA: Diagnosis present

## 2013-11-30 DIAGNOSIS — IMO0002 Reserved for concepts with insufficient information to code with codable children: Secondary | ICD-10-CM | POA: Diagnosis present

## 2013-11-30 DIAGNOSIS — F329 Major depressive disorder, single episode, unspecified: Secondary | ICD-10-CM | POA: Diagnosis present

## 2013-11-30 DIAGNOSIS — G589 Mononeuropathy, unspecified: Secondary | ICD-10-CM | POA: Diagnosis present

## 2013-11-30 DIAGNOSIS — F1910 Other psychoactive substance abuse, uncomplicated: Secondary | ICD-10-CM | POA: Diagnosis present

## 2013-11-30 DIAGNOSIS — M869 Osteomyelitis, unspecified: Secondary | ICD-10-CM | POA: Diagnosis present

## 2013-11-30 HISTORY — DX: Personal history of other diseases of the digestive system: Z87.19

## 2013-11-30 HISTORY — DX: Low back pain: M54.5

## 2013-11-30 HISTORY — DX: Other chronic pain: G89.29

## 2013-11-30 HISTORY — DX: Low back pain, unspecified: M54.50

## 2013-11-30 HISTORY — DX: Personal history of other medical treatment: Z92.89

## 2013-11-30 HISTORY — DX: Personal history of peptic ulcer disease: Z87.11

## 2013-11-30 LAB — COMPREHENSIVE METABOLIC PANEL
ALT: 14 U/L (ref 0–53)
AST: 19 U/L (ref 0–37)
Albumin: 3.2 g/dL — ABNORMAL LOW (ref 3.5–5.2)
Alkaline Phosphatase: 135 U/L — ABNORMAL HIGH (ref 39–117)
BUN: 14 mg/dL (ref 6–23)
CHLORIDE: 100 meq/L (ref 96–112)
CO2: 27 meq/L (ref 19–32)
Calcium: 9 mg/dL (ref 8.4–10.5)
Creatinine, Ser: 1.09 mg/dL (ref 0.50–1.35)
GFR, EST NON AFRICAN AMERICAN: 85 mL/min — AB (ref >90)
GLUCOSE: 105 mg/dL — AB (ref 70–99)
Potassium: 4.5 mEq/L (ref 3.7–5.3)
SODIUM: 140 meq/L (ref 137–147)
Total Protein: 7.3 g/dL (ref 6.0–8.3)

## 2013-11-30 LAB — CBC WITH DIFFERENTIAL/PLATELET
BASOS ABS: 0.1 10*3/uL (ref 0.0–0.1)
Basophils Relative: 1 % (ref 0–1)
EOS ABS: 0.5 10*3/uL (ref 0.0–0.7)
Eosinophils Relative: 5 % (ref 0–5)
HCT: 39.9 % (ref 39.0–52.0)
Hemoglobin: 13.3 g/dL (ref 13.0–17.0)
Lymphocytes Relative: 28 % (ref 12–46)
Lymphs Abs: 2.7 10*3/uL (ref 0.7–4.0)
MCH: 30 pg (ref 26.0–34.0)
MCHC: 33.3 g/dL (ref 30.0–36.0)
MCV: 89.9 fL (ref 78.0–100.0)
Monocytes Absolute: 0.5 10*3/uL (ref 0.1–1.0)
Monocytes Relative: 5 % (ref 3–12)
NEUTROS PCT: 61 % (ref 43–77)
Neutro Abs: 5.9 10*3/uL (ref 1.7–7.7)
PLATELETS: 498 10*3/uL — AB (ref 150–400)
RBC: 4.44 MIL/uL (ref 4.22–5.81)
RDW: 14 % (ref 11.5–15.5)
WBC: 9.8 10*3/uL (ref 4.0–10.5)

## 2013-11-30 LAB — PROTIME-INR
INR: 0.97 (ref 0.00–1.49)
PROTHROMBIN TIME: 12.7 s (ref 11.6–15.2)

## 2013-11-30 LAB — SURGICAL PCR SCREEN
MRSA, PCR: NEGATIVE
STAPHYLOCOCCUS AUREUS: NEGATIVE

## 2013-11-30 LAB — URINALYSIS, ROUTINE W REFLEX MICROSCOPIC
BILIRUBIN URINE: NEGATIVE
Glucose, UA: NEGATIVE mg/dL
HGB URINE DIPSTICK: NEGATIVE
KETONES UR: NEGATIVE mg/dL
Leukocytes, UA: NEGATIVE
Nitrite: NEGATIVE
Protein, ur: NEGATIVE mg/dL
SPECIFIC GRAVITY, URINE: 1.019 (ref 1.005–1.030)
UROBILINOGEN UA: 0.2 mg/dL (ref 0.0–1.0)
pH: 8 (ref 5.0–8.0)

## 2013-11-30 LAB — SEDIMENTATION RATE: Sed Rate: 50 mm/hr — ABNORMAL HIGH (ref 0–16)

## 2013-11-30 LAB — HEMOGLOBIN A1C
Hgb A1c MFr Bld: 5.8 % — ABNORMAL HIGH (ref <5.7)
Mean Plasma Glucose: 120 mg/dL — ABNORMAL HIGH (ref <117)

## 2013-11-30 LAB — C-REACTIVE PROTEIN: CRP: 1.4 mg/dL — AB (ref <0.60)

## 2013-11-30 MED ORDER — HYDROXYZINE HCL 25 MG PO TABS
50.0000 mg | ORAL_TABLET | Freq: Every evening | ORAL | Status: DC | PRN
  Administered 2013-11-30 – 2013-12-05 (×7): 50 mg via ORAL
  Filled 2013-11-30 (×8): qty 2

## 2013-11-30 MED ORDER — GABAPENTIN 600 MG PO TABS
600.0000 mg | ORAL_TABLET | Freq: Three times a day (TID) | ORAL | Status: DC
  Administered 2013-11-30 – 2013-12-06 (×16): 600 mg via ORAL
  Filled 2013-11-30 (×19): qty 1

## 2013-11-30 MED ORDER — OXYCODONE-ACETAMINOPHEN 5-325 MG PO TABS
1.0000 | ORAL_TABLET | Freq: Four times a day (QID) | ORAL | Status: DC | PRN
  Administered 2013-11-30 – 2013-12-06 (×20): 2 via ORAL
  Filled 2013-11-30 (×20): qty 2

## 2013-11-30 MED ORDER — ONDANSETRON HCL 4 MG PO TABS: 4.0000 mg | ORAL_TABLET | Freq: Four times a day (QID) | ORAL | Status: DC | PRN

## 2013-11-30 MED ORDER — SODIUM CHLORIDE 0.9 % IV SOLN: INTRAVENOUS | Status: DC

## 2013-11-30 MED ORDER — DOXEPIN HCL 75 MG PO CAPS
150.0000 mg | ORAL_CAPSULE | Freq: Every day | ORAL | Status: DC
  Administered 2013-11-30 – 2013-12-05 (×6): 150 mg via ORAL
  Filled 2013-11-30 (×8): qty 2

## 2013-11-30 MED ORDER — BACLOFEN 10 MG PO TABS
10.0000 mg | ORAL_TABLET | Freq: Three times a day (TID) | ORAL | Status: DC | PRN
  Administered 2013-11-30 – 2013-12-06 (×6): 10 mg via ORAL
  Filled 2013-11-30 (×5): qty 1

## 2013-11-30 MED ORDER — RIFAMPIN 300 MG PO CAPS
300.0000 mg | ORAL_CAPSULE | Freq: Two times a day (BID) | ORAL | Status: DC
  Administered 2013-11-30 – 2013-12-06 (×11): 300 mg via ORAL
  Filled 2013-11-30 (×13): qty 1

## 2013-11-30 MED ORDER — VANCOMYCIN HCL IN DEXTROSE 1-5 GM/200ML-% IV SOLN
1000.0000 mg | Freq: Two times a day (BID) | INTRAVENOUS | Status: DC
  Administered 2013-11-30 – 2013-12-06 (×11): 1000 mg via INTRAVENOUS
  Filled 2013-11-30 (×14): qty 200

## 2013-11-30 MED ORDER — OXYCODONE HCL 5 MG PO TABS
5.0000 mg | ORAL_TABLET | ORAL | Status: DC | PRN
  Administered 2013-11-30 – 2013-12-06 (×28): 10 mg via ORAL
  Filled 2013-11-30 (×29): qty 2

## 2013-11-30 MED ORDER — SODIUM CHLORIDE 0.9 % IV SOLN
INTRAVENOUS | Status: DC
  Administered 2013-11-30: 20 mL/h via INTRAVENOUS

## 2013-11-30 MED ORDER — ACETAMINOPHEN 650 MG RE SUPP: 650.0000 mg | Freq: Four times a day (QID) | RECTAL | Status: DC | PRN

## 2013-11-30 MED ORDER — AMITRIPTYLINE HCL 50 MG PO TABS
50.0000 mg | ORAL_TABLET | Freq: Every day | ORAL | Status: DC
  Administered 2013-11-30 – 2013-12-05 (×6): 50 mg via ORAL
  Filled 2013-11-30 (×8): qty 1

## 2013-11-30 MED ORDER — ONDANSETRON HCL 4 MG/2ML IJ SOLN: 4.0000 mg | Freq: Four times a day (QID) | INTRAMUSCULAR | Status: DC | PRN

## 2013-11-30 MED ORDER — ALUM & MAG HYDROXIDE-SIMETH 200-200-20 MG/5ML PO SUSP: 30.0000 mL | Freq: Four times a day (QID) | ORAL | Status: DC | PRN

## 2013-11-30 MED ORDER — ACETAMINOPHEN 325 MG PO TABS: 650.0000 mg | ORAL_TABLET | Freq: Four times a day (QID) | ORAL | Status: DC | PRN

## 2013-11-30 MED ORDER — DOCUSATE SODIUM 100 MG PO CAPS
100.0000 mg | ORAL_CAPSULE | Freq: Two times a day (BID) | ORAL | Status: DC
  Administered 2013-11-30 – 2013-12-06 (×10): 100 mg via ORAL
  Filled 2013-11-30 (×12): qty 1

## 2013-11-30 NOTE — H&P (Signed)
[facility]opaedic Tra[facility] Service H&P  CC: soft tissue infection L leg s/p repair of L proximal tibia nonunion 11/18/2013  HPI   38 y/o white male well known to [facility], underwent repair of L proximal tibia nonunion on 11/18/2013. Pt was discharged from the hospital on 11/19/2012. Pt was seen in the office today, pt had moderate amount of drainage from his surgical wound, along with erythema and partial dehiscence of his lateral wound.  Pt states that he went to urgent care over the weekend and was given abx. Erythema has decreased some over the last several days. Pt reports moderate pain in L knee.  Pt also continues to smoke about 1 ppd.  Pt sent directly form our office to the hospital for IV abx and possible surgical debridement of wound   Past Medical History  Diagnosis Date  . Status post lumbar surgery   . History of appendectomy   . History of shoulder surgery   . Chronic back pain   . Chronic shoulder pain     1992  . Drug use     marijunana   . GERD (gastroesophageal reflux disease)   . Smoker     1.5ppd  . Anxiety   . Bronchitis, chronic     "been awhile"  . Complication of anesthesia   . PONV (postoperative nausea and vomiting)   . Ulcer   . Depression   . Neuropathy   . Short-term memory loss     Past Surgical History  Procedure Laterality Date  . Lumbar spine surgery      x 3   . Application of wound vac Left 03/01/2013    Procedure:  WOUND VAC CHANGE;  Surgeon: Nada Libman, MD;  Location[facility] OR;  Service: Vascular;  Laterality: Left;  . I&d extremity Left 03/01/2013    Procedure: IRRIGATION AND DEBRIDEMENT EXTREMITY;  Surgeon: Nada Libman, MD;  Location: [facility] OR;  Service: Vascular;  Laterality: Left;  . I&d extremity Left 03/03/2013    Procedure: REPEAT Irrigation and DRAINAGE OF LEFT LEG;  Surgeon: Budd Palmer, MD;  Location: [facility] OR;  Service: Orthopedics;  Laterality: Left;  . Application of wound vac Left 03/03/2013    Procedure: APPLICATION OF WOUND VAC;   Surgeon: Budd Palmer, MD;  Location: [facility] OR;  Service: Orthopedics;  Laterality: Left;  . External fixation leg Left 02/26/2013    Procedure: EXTERNAL FIXATION LEG;  Surgeon: Senaida Lange, MD;  Location: [facility] OR;  Service: Orthopedics;  Laterality: Left;  . Bypass graft popliteal to tibial Left 02/26/2013    Procedure: BYPASS GRAFT POPLITEAL TO TIBIAL;  Surgeon: Nada Libman, MD;  Location: [facility] OR;  Service: Vascular;  Laterality: Left;  using Right Reversed Greater Saphenous Vein  . Application of wound vac Left 02/26/2013    Procedure: APPLICATION OF WOUND VAC;  Surgeon: Nada Libman, MD;  Location: [facility] OR;  Service: Vascular;  Laterality: Left;  . Skin split graft Right 03/08/2013    Procedure: LEFT LEG SPLIT THICKNESS SKIN GRAFT ;  Surgeon: Budd Palmer, MD;  Location: [facility] OR;  Service: Orthopedics;  Laterality: Right;  . Orif tibia plateau Left 03/08/2013    Procedure: OPEN REDUCTION INTERNAL FIXATION (ORIF) TIBIAL PLATEAU;  Surgeon: Budd Palmer, MD;  Location: [facility] OR;  Service: Orthopedics;  Laterality: Left;  Placement of cement spacer  . External fixation removal Left 03/08/2013    Procedure: REMOVAL EXTERNAL FIXATION LEG;  Surgeon: Budd Palmer, MD;  Location:  [facility] OR;  Service: Orthopedics;  Laterality: Left;  . Application of wound vac Left 03/08/2013    Procedure: APPLICATION OF WOUND VAC;  Surgeon: Budd Palmer, MD;  Location: [facility] OR;  Service: Orthopedics;  Laterality: Left;  Wound VAC Exchange  . Back surgery      x 3  . Appendectomy    . Orif tibia plateau Left 07/26/2013    Procedure: NONUNION REPAIR LEFT PROXIMAL TIBIA WITH BONE GRAFT/REMOVING ANTIBIOTIC SPACER;  Surgeon: Budd Palmer, MD;  Location: [facility] OR;  Service: Orthopedics;  Laterality: Left;  . Rotator cuff repair Left   . Orif tibia fracture Left 11/18/2013    Procedure: ORIF tibia;  Surgeon: Budd Palmer, MD;  Location: [facility] OR;  Service: Orthopedics;  Laterality: Left;  . Hardware removal Left 11/18/2013     Procedure: HARDWARE REMOVAL;  Surgeon: Budd Palmer, MD;  Location: [facility] OR;  Service: Orthopedics;  Laterality: Left;    Allergies  Allergen Reactions  . Flexeril [Cyclobenzaprine] Other (See Comments)    Makes him extremely agitated.   [name] Penicillins Itching and Rash    No family history on file.  History   Social History  . Marital Status: Single    Spouse Name: N/A    Number of Children: N/A  . Years of Education: N/A   Occupational History  . Not on file.   Social History Main Topics  . Smoking status: Current Every Day Smoker -- 1.00 packs/day for 25 years    Types: Cigarettes    Last Attempt to Quit: 11/10/2013  . Smokeless tobacco: Never Used  . Alcohol Use: No  . Drug Use: Yes    Special: Marijuana     Comment: only smokes occasionally   . Sexual Activity: Not on file   Other Topics Concern  . Not on file   Social History Narrative  . No narrative on file    Medications Prior to Admission  Medication Sig Dispense Refill  . amitriptyline (ELAVIL) 50 MG tablet Take 1 tablet (50 mg total) by mouth at bedtime. For depression/insomnia  30 tablet  0  . baclofen (LIORESAL) 10 MG tablet Take 1 tablet (10 mg total) by mouth 3 (three) times daily. For muscle pain/spasms  60 each  0  . docusate sodium 100 MG CAPS Take 100 mg by mouth 2 (two) times daily.  20 capsule  0  . doxepin (SINEQUAN) 150 MG capsule Take 1 capsule (150 mg total) by mouth at bedtime. For      . esomeprazole (NEXIUM) 40 MG capsule Take 1 capsule (40 mg total) by mouth daily. For acid reflux      . gabapentin (NEURONTIN) 300 MG capsule Take 600 mg by mouth 3 (three) times daily.      [name] HYDROcodone-acetaminophen (NORCO) 10-325 MG per tablet Take 1-2 tablets by mouth every 6 (six) hours as needed for moderate pain or severe pain.  80 tablet  0  . hydrOXYzine (ATARAX/VISTARIL) 50 MG tablet Take 50 mg by mouth at bedtime.      [name] oxyCODONE (OXY IR/ROXICODONE) 5 MG immediate release tablet Take 1-2  tablets (5-10 mg total) by mouth every 3 (three) hours as needed for breakthrough pain (take between hydrocodone for breakthrough pain).  50 tablet  0   Review of Systems  Constitutional: Negative for fever and chills.  Respiratory: Negative for shortness of breath and wheezing.   Cardiovascular: Negative for chest pain and palpitations.  Gastrointestinal: Negative for nausea, vomiting  and abdominal pain.  Musculoskeletal:       L knee pain   Neurological: Negative for headaches.       Baseline foot drop L     Physical Exam   BP 116/72  Pulse 114  Temp(Src) 97.9 F (36.6 C)  Resp 20  SpO2 100%   Physical Exam  Constitutional: He is cooperative.  HENT:  Head: Normocephalic and atraumatic.  Mouth/Throat: Abnormal dentition.  Eyes: EOM are normal. Pupils are equal, round, and reactive to light.  Cardiovascular: Normal rate, regular rhythm, S1 normal and S2 normal.   Pulmonary/Chest: Effort normal.  Decreased sounds at bases   Abdominal:  Soft, NTND, + BS  Musculoskeletal:  Left Lower Extremity   Erythema to L leg, primarily lateral   Desquamation of skin noted as well   Proximal aspect of proximal wound is partially dehisced, size of pencil eraser    No frank purulence   serousanguinous to yellow dishcarge   Medial STSG site still with eschar proximally    Ext warm    + DP pulse   Dec sensation to L foot but this is baseline   No active extension of ankle, baseline   EHL, FHL motor intact    + DP pulse   Neurological: He is alert.    Imaging (office)  2 view L knee- stable appearance s/p ORIF L proximal tibia nonunion   Labs  Pending   Assessment and Plan  38 y/o male s/p repair of L proximal tibia nonunion with soft tissue infection, possible deep infection   1. Soft tissue infection L leg  Admit for IV abx  Cultures obtained in office  Possible surgical debridement tomorrow  Dressing changes as needed    Plan wound be to attempt to suppress  infection until union, if this fails pt will likely need plastics   2. L proximal tibia nonunion s/p repair 12 days ago  NWB L leg  Out of bed ad lib with walker  3. Nicotine dependence  Discussed again that his nicotine use continues to further increase his risk of complications    4. Medical issues  Reconcile home meds and resume  5. DVT and PE prophylaxis   Foot pumps  6. ID  Start vancomycin and rifampin  Adjust based on cultures  If no organism id'd, pt may need picc  7.FEN  Reg for now  Npo after MN  KVO IVF  protonix   8. Dispo  Start IV abx  Possible surgical debridement tomorrow  Anticipate 5-6 day hospital stay   Mearl LatinKeith W. Julieanne Hadsall, PA-C [facility] [phone] (P) 11/30/2013 4:22 PM
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
## 2013-12-01 ENCOUNTER — Encounter (HOSPITAL_COMMUNITY): Payer: Medicare Other | Admitting: Certified Registered"

## 2013-12-01 ENCOUNTER — Inpatient Hospital Stay (HOSPITAL_COMMUNITY): Payer: Medicare Other | Admitting: Certified Registered"

## 2013-12-01 ENCOUNTER — Encounter (HOSPITAL_COMMUNITY)
Admission: AD | Disposition: A | Discharge status: Home Health | Payer: Self-pay | Source: Ambulatory Visit | Attending: Orthopedic Surgery

## 2013-12-01 ENCOUNTER — Encounter (HOSPITAL_COMMUNITY): Payer: Self-pay | Admitting: Certified Registered"

## 2013-12-01 HISTORY — PX: I & D EXTREMITY: SHX5045

## 2013-12-01 LAB — URINE DRUGS OF ABUSE SCREEN W ALC, ROUTINE (REF LAB)
Amphetamine Screen, Ur: NEGATIVE
BARBITURATE QUANT UR: NEGATIVE
BENZODIAZEPINES.: POSITIVE — AB
COCAINE METABOLITES: NEGATIVE
Creatinine,U: 129.2 mg/dL
Marijuana Metabolite: POSITIVE — AB
Methadone: NEGATIVE
Opiate Screen, Urine: POSITIVE — AB
Phencyclidine (PCP): NEGATIVE
Propoxyphene: NEGATIVE

## 2013-12-01 LAB — GRAM STAIN

## 2013-12-01 SURGERY — IRRIGATION AND DEBRIDEMENT EXTREMITY
Anesthesia: General | Site: Leg Lower | Laterality: Left

## 2013-12-01 MED ORDER — ONDANSETRON HCL 4 MG/2ML IJ SOLN
INTRAMUSCULAR | Status: DC | PRN
  Administered 2013-12-01: 4 mg via INTRAVENOUS

## 2013-12-01 MED ORDER — PROPOFOL 10 MG/ML IV BOLUS
INTRAVENOUS | Status: AC
  Filled 2013-12-01: qty 20

## 2013-12-01 MED ORDER — VANCOMYCIN HCL 1000 MG IV SOLR
INTRAVENOUS | Status: AC
  Filled 2013-12-01: qty 1000

## 2013-12-01 MED ORDER — GENTAMICIN SULFATE 40 MG/ML IJ SOLN
INTRAMUSCULAR | Status: AC
  Filled 2013-12-01: qty 6

## 2013-12-01 MED ORDER — FENTANYL CITRATE 0.05 MG/ML IJ SOLN
INTRAMUSCULAR | Status: AC
  Filled 2013-12-01: qty 5

## 2013-12-01 MED ORDER — ONDANSETRON HCL 4 MG/2ML IJ SOLN: 4.0000 mg | Freq: Once | INTRAMUSCULAR | Status: DC | PRN

## 2013-12-01 MED ORDER — HYDROMORPHONE HCL PF 1 MG/ML IJ SOLN
INTRAMUSCULAR | Status: AC
  Filled 2013-12-01: qty 1

## 2013-12-01 MED ORDER — MIDAZOLAM HCL 2 MG/2ML IJ SOLN
INTRAMUSCULAR | Status: AC
  Filled 2013-12-01: qty 2

## 2013-12-01 MED ORDER — LIDOCAINE HCL (CARDIAC) 20 MG/ML IV SOLN
INTRAVENOUS | Status: AC
  Filled 2013-12-01: qty 5

## 2013-12-01 MED ORDER — LIDOCAINE HCL (CARDIAC) 20 MG/ML IV SOLN
INTRAVENOUS | Status: DC | PRN
  Administered 2013-12-01: 100 mg via INTRAVENOUS

## 2013-12-01 MED ORDER — SODIUM CHLORIDE 0.9 % IV SOLN
INTRAVENOUS | Status: DC
Start: 2013-12-01 — End: 2013-12-06
  Administered 2013-12-01 – 2013-12-03 (×3): via INTRAVENOUS

## 2013-12-01 MED ORDER — ONDANSETRON HCL 4 MG/2ML IJ SOLN
INTRAMUSCULAR | Status: AC
  Filled 2013-12-01: qty 2

## 2013-12-01 MED ORDER — SODIUM CHLORIDE 0.9 % IR SOLN
Status: DC | PRN
  Administered 2013-12-01: 1000 mL

## 2013-12-01 MED ORDER — LACTATED RINGERS IV SOLN
INTRAVENOUS | Status: DC
  Administered 2013-12-01 (×2): via INTRAVENOUS

## 2013-12-01 MED ORDER — VANCOMYCIN HCL 1000 MG IV SOLR
INTRAVENOUS | Status: DC | PRN
Start: 2013-12-01 — End: 2013-12-01
  Administered 2013-12-01: 1000 mg

## 2013-12-01 MED ORDER — SODIUM CHLORIDE 0.9 % IR SOLN
Status: DC | PRN
  Administered 2013-12-01: 3000 mL

## 2013-12-01 MED ORDER — HYDROMORPHONE HCL PF 1 MG/ML IJ SOLN
0.2500 mg | INTRAMUSCULAR | Status: DC | PRN
  Administered 2013-12-01 (×2): 0.5 mg via INTRAVENOUS

## 2013-12-01 MED ORDER — MIDAZOLAM HCL 5 MG/5ML IJ SOLN
INTRAMUSCULAR | Status: DC | PRN
  Administered 2013-12-01 (×2): 2 mg via INTRAVENOUS

## 2013-12-01 MED ORDER — ENOXAPARIN SODIUM 40 MG/0.4ML ~~LOC~~ SOLN
40.0000 mg | SUBCUTANEOUS | Status: DC
  Administered 2013-12-01 – 2013-12-05 (×5): 40 mg via SUBCUTANEOUS
  Filled 2013-12-01 (×6): qty 0.4

## 2013-12-01 MED ORDER — MEPERIDINE HCL 25 MG/ML IJ SOLN: 6.2500 mg | INTRAMUSCULAR | Status: DC | PRN

## 2013-12-01 MED ORDER — OXYCODONE HCL 5 MG/5ML PO SOLN: 5.0000 mg | Freq: Once | ORAL | Status: DC | PRN

## 2013-12-01 MED ORDER — GENTAMICIN SULFATE 40 MG/ML IJ SOLN
INTRAMUSCULAR | Status: DC | PRN
  Administered 2013-12-01: 240 mg

## 2013-12-01 MED ORDER — 0.9 % SODIUM CHLORIDE (POUR BTL) OPTIME
TOPICAL | Status: DC | PRN
  Administered 2013-12-01: 1000 mL

## 2013-12-01 MED ORDER — PROPOFOL 10 MG/ML IV BOLUS
INTRAVENOUS | Status: DC | PRN
  Administered 2013-12-01: 150 mg via INTRAVENOUS

## 2013-12-01 MED ORDER — OXYCODONE HCL 5 MG PO TABS: 5.0000 mg | ORAL_TABLET | Freq: Once | ORAL | Status: DC | PRN

## 2013-12-01 MED ORDER — HYDROMORPHONE HCL PF 1 MG/ML IJ SOLN
INTRAMUSCULAR | Status: DC | PRN
  Administered 2013-12-01: 1 mg via INTRAVENOUS

## 2013-12-01 MED ORDER — FENTANYL CITRATE 0.05 MG/ML IJ SOLN
INTRAMUSCULAR | Status: DC | PRN
  Administered 2013-12-01: 150 ug via INTRAVENOUS
  Administered 2013-12-01 (×3): 100 ug via INTRAVENOUS
  Administered 2013-12-01: 50 ug via INTRAVENOUS

## 2013-12-01 SURGICAL SUPPLY — 63 items
BANDAGE ELASTIC 4 VELCRO ST LF (GAUZE/BANDAGES/DRESSINGS) ×3 IMPLANT
BANDAGE ELASTIC 6 VELCRO ST LF (GAUZE/BANDAGES/DRESSINGS) ×3 IMPLANT
BANDAGE GAUZE ELAST BULKY 4 IN (GAUZE/BANDAGES/DRESSINGS) IMPLANT
BLADE SURG 10 STRL SS (BLADE) ×3 IMPLANT
BNDG COHESIVE 4X5 TAN STRL (GAUZE/BANDAGES/DRESSINGS) IMPLANT
BNDG GAUZE ELAST 4 BULKY (GAUZE/BANDAGES/DRESSINGS) ×3 IMPLANT
BNDG GAUZE STRTCH 6 (GAUZE/BANDAGES/DRESSINGS) IMPLANT
BRUSH SCRUB DISP (MISCELLANEOUS) IMPLANT
CANISTER WOUND CARE 500ML ATS (WOUND CARE) ×3 IMPLANT
CONT SPEC 4OZ CLIKSEAL STRL BL (MISCELLANEOUS) ×3 IMPLANT
COVER SURGICAL LIGHT HANDLE (MISCELLANEOUS) ×3 IMPLANT
COVER TABLE BACK 60X90 (DRAPES) IMPLANT
DRAPE U-SHAPE 47X51 STRL (DRAPES) ×3 IMPLANT
DRSG ADAPTIC 3X8 NADH LF (GAUZE/BANDAGES/DRESSINGS) ×3 IMPLANT
DRSG VAC ATS LRG SENSATRAC (GAUZE/BANDAGES/DRESSINGS) ×3 IMPLANT
ELECT CAUTERY BLADE 6.4 (BLADE) IMPLANT
ELECT REM PT RETURN 9FT ADLT (ELECTROSURGICAL) ×3
ELECTRODE REM PT RTRN 9FT ADLT (ELECTROSURGICAL) ×1 IMPLANT
GLOVE BIO SURGEON STRL SZ7 (GLOVE) ×6 IMPLANT
GLOVE BIO SURGEON STRL SZ7.5 (GLOVE) ×3 IMPLANT
GLOVE BIO SURGEON STRL SZ8 (GLOVE) ×3 IMPLANT
GLOVE BIOGEL PI IND STRL 6 (GLOVE) ×1 IMPLANT
GLOVE BIOGEL PI IND STRL 7.0 (GLOVE) ×1 IMPLANT
GLOVE BIOGEL PI IND STRL 7.5 (GLOVE) ×1 IMPLANT
GLOVE BIOGEL PI IND STRL 8 (GLOVE) ×1 IMPLANT
GLOVE BIOGEL PI INDICATOR 6 (GLOVE) ×2
GLOVE BIOGEL PI INDICATOR 7.0 (GLOVE) ×2
GLOVE BIOGEL PI INDICATOR 7.5 (GLOVE) ×2
GLOVE BIOGEL PI INDICATOR 8 (GLOVE) ×2
GLOVE SURG SS PI 7.0 STRL IVOR (GLOVE) ×3 IMPLANT
GOWN STRL REUS W/ TWL LRG LVL3 (GOWN DISPOSABLE) ×2 IMPLANT
GOWN STRL REUS W/ TWL XL LVL3 (GOWN DISPOSABLE) ×1 IMPLANT
GOWN STRL REUS W/TWL LRG LVL3 (GOWN DISPOSABLE) ×4
GOWN STRL REUS W/TWL XL LVL3 (GOWN DISPOSABLE) ×2
HANDPIECE INTERPULSE COAX TIP (DISPOSABLE) ×2
KIT BASIN OR (CUSTOM PROCEDURE TRAY) ×3 IMPLANT
KIT ROOM TURNOVER OR (KITS) ×3 IMPLANT
KIT STIMULAN RAPID CURE  10CC (Orthopedic Implant) ×2 IMPLANT
KIT STIMULAN RAPID CURE 10CC (Orthopedic Implant) ×1 IMPLANT
MANIFOLD NEPTUNE II (INSTRUMENTS) ×3 IMPLANT
NEEDLE 18GX1X1/2 (RX/OR ONLY) (NEEDLE) ×3 IMPLANT
NS IRRIG 1000ML POUR BTL (IV SOLUTION) ×3 IMPLANT
PACK GENERAL/GYN (CUSTOM PROCEDURE TRAY) ×3 IMPLANT
PACK ORTHO EXTREMITY (CUSTOM PROCEDURE TRAY) ×3 IMPLANT
PAD ARMBOARD 7.5X6 YLW CONV (MISCELLANEOUS) ×6 IMPLANT
PADDING CAST COTTON 6X4 STRL (CAST SUPPLIES) IMPLANT
SCRUB BETADINE 4OZ XXX (MISCELLANEOUS) ×3 IMPLANT
SET HNDPC FAN SPRY TIP SCT (DISPOSABLE) ×1 IMPLANT
SOLUTION BETADINE 4OZ (MISCELLANEOUS) ×3 IMPLANT
SPONGE GAUZE 4X4 12PLY (GAUZE/BANDAGES/DRESSINGS) ×3 IMPLANT
SPONGE GAUZE 4X4 12PLY STER LF (GAUZE/BANDAGES/DRESSINGS) ×3 IMPLANT
SPONGE LAP 18X18 X RAY DECT (DISPOSABLE) IMPLANT
STOCKINETTE IMPERVIOUS 9X36 MD (GAUZE/BANDAGES/DRESSINGS) ×3 IMPLANT
SUT PDS AB 2-0 CT1 27 (SUTURE) IMPLANT
SYR CONTROL 10ML LL (SYRINGE) ×3 IMPLANT
TOWEL OR 17X24 6PK STRL BLUE (TOWEL DISPOSABLE) IMPLANT
TOWEL OR 17X26 10 PK STRL BLUE (TOWEL DISPOSABLE) ×3 IMPLANT
TUBE ANAEROBIC SPECIMEN COL (MISCELLANEOUS) IMPLANT
TUBE CONNECTING 12'X1/4 (SUCTIONS)
TUBE CONNECTING 12X1/4 (SUCTIONS) IMPLANT
UNDERPAD 30X30 INCONTINENT (UNDERPADS AND DIAPERS) ×3 IMPLANT
WATER STERILE IRR 1000ML POUR (IV SOLUTION) ×3 IMPLANT
YANKAUER SUCT BULB TIP NO VENT (SUCTIONS) IMPLANT

## 2013-12-01 NOTE — H&P (Signed)
I saw and examined the patient with Mr. Renae Fickleaul in our clinic, communicating the findings and plan noted above.  Myrene GalasMichael Isami Mehra, MD [facility] [phone] (p)

## 2013-12-01 NOTE — Transfer of Care (Signed)
Immediate Anesthesia Transfer of Care Note  Patient: [name] HedgesLarry Pierce  Procedure(s) Performed: Procedure(s): IRRIGATION AND DEBRIDEMENT LEFT LEG (Left)  Patient Location: PACU  Anesthesia Type:General  Level of Consciousness: awake, alert , oriented and patient cooperative  Airway & Oxygen Therapy: Patient Spontanous Breathing and Patient connected to face mask oxygen  Post-op Assessment: Report given to PACU RN, Post -op Vital signs reviewed and stable and Patient moving all extremities X 4  Post vital signs: Reviewed and stable  Complications: No apparent anesthesia complications

## 2013-12-01 NOTE — Progress Notes (Signed)
Utilization review completed. Shantale Holtmeyer, RN, BSN. 

## 2013-12-01 NOTE — Progress Notes (Signed)
I have seen and examined the patient. I agree with the findings above.  I discussed with the patient the risks and benefits of surgery for irrigation and debridement of his surgical wound, including the possibility of failure to resolve infection, nerve injury, vessel injury, wound breakdown, arthritis, symptomatic hardware, DVT/ PE, loss of motion, and need for further surgery among others.  We also specifically discussed the elevated risk of soft tissue breakdown that could lead to amputation.  He understood these risks and wished to proceed.   Budd PalmerHANDY,[name], MD 12/01/2013 12:25 PM

## 2013-12-01 NOTE — Progress Notes (Signed)
Pt. States dentures and jewerly sent home yesterday. Pt. States his glasses are in his room upstairs on [facility] [employer].

## 2013-12-01 NOTE — Progress Notes (Signed)
[facility] Progress Note  Subjective  Doing ok Nurse last night cleaned wound with betadine, no orders were placed for wound care as a dressing was applied in our office  No new issues   Review of Systems  Constitutional: Negative for fever and chills.  Respiratory: Negative for shortness of breath.   Cardiovascular: Negative for chest pain and palpitations.  Gastrointestinal: Negative for nausea, vomiting and abdominal pain.  Genitourinary: Negative for dysuria.  Neurological:       Baseline neurologic dysfunction L leg, chronic foot drop       Objective   BP 93/62  Pulse 69  Temp(Src) 97.3 F (36.3 C) (Oral)  Resp 20  Ht 5\' 8"  (1.727 m)  Wt 86.183 kg (190 lb)  BMI 28.90 kg/m2  SpO2 99%  Intake/Output     02/25 0701 - 02/26 0700 02/26 0701 - 02/27 0700   P.O. 740    Total Intake(mL/kg) 740 (8.6)    Urine (mL/kg/hr) 700 600 (2.3)   Total Output 700 600   Net +40 -600          Labs  Results for [name], [name] (MRN [id number]) as of 12/01/2013 09:51  Ref. Range 11/30/2013 18:37  Sodium Latest Range: 137-147 mEq/L 140  Potassium Latest Range: 3.7-5.3 mEq/L 4.5  Chloride Latest Range: 96-112 mEq/L 100  CO2 Latest Range: 19-32 mEq/L 27  Mean Plasma Glucose Latest Range: <117 mg/dL 409 (H)  BUN Latest Range: 6-23 mg/dL 14  Creatinine Latest Range: 0.50-1.35 mg/dL 8.11  Calcium Latest Range: 8.4-10.5 mg/dL 9.0  GFR calc non Af Amer Latest Range: >90 mL/min 85 (L)  GFR calc Af Amer Latest Range: >90 mL/min >90  Glucose Latest Range: 70-99 mg/dL 914 (H)  Alkaline Phosphatase Latest Range: 39-117 U/L 135 (H)  Albumin Latest Range: 3.5-5.2 g/dL 3.2 (L)  AST Latest Range: 0-37 U/L 19  ALT Latest Range: 0-53 U/L 14  Total Protein Latest Range: 6.0-8.3 g/dL 7.3  Total Bilirubin Latest Range: 0.3-1.2 mg/dL <7.8 (L)  CRP Latest Range: <0.60 mg/dL 1.4 (H)  WBC Latest Range: 4.0-10.5 K/uL 9.8  RBC Latest Range: 4.22-5.81 MIL/uL 4.44  Hemoglobin Latest  Range: 13.0-17.0 g/dL 29.5  HCT Latest Range: 39.0-52.0 % 39.9  MCV Latest Range: 78.0-100.0 fL 89.9  MCH Latest Range: 26.0-34.0 pg 30.0  MCHC Latest Range: 30.0-36.0 g/dL 62.1  RDW Latest Range: 11.5-15.5 % 14.0  Platelets Latest Range: 150-400 K/uL 498 (H)  Neutrophils Relative % Latest Range: 43-77 % 61  Lymphocytes Relative Latest Range: 12-46 % 28  Monocytes Relative Latest Range: 3-12 % 5  Eosinophils Relative Latest Range: 0-5 % 5  Basophils Relative Latest Range: 0-1 % 1  NEUT# Latest Range: 1.7-7.7 K/uL 5.9  Lymphocytes Absolute Latest Range: 0.7-4.0 K/uL 2.7  Monocytes Absolute Latest Range: 0.1-1.0 K/uL 0.5  Eosinophils Absolute Latest Range: 0.0-0.7 K/uL 0.5  Basophils Absolute Latest Range: 0.0-0.1 K/uL 0.1  Sed Rate Latest Range: 0-16 mm/hr 50 (H)  Prothrombin Time Latest Range: 11.6-15.2 seconds 12.7  INR Latest Range: 0.00-1.49  0.97  Hemoglobin A1C Latest Range: <5.7 % 5.8 (H)    Exam  Gen: sleeping but arousable, NAD Lungs: dec at bases Cardiac: s1 and s2, RRR Abd: + BS, NTND Ext:       Left Lower Extremity  Erythema appears to have decreased  Swelling stable  Persistent wound proximal aspect of proximal incision   Necrotic tissue   No active drainage  Betadine noted to L leg   Ext  warm  + DP pulse  Motor and sensory functions at baseline    Assessment and Plan   POD/HD#: 1    38 y/o male s/p repair of L proximal tibia nonunion with soft tissue infection, possible deep infection   1. Soft tissue infection L leg            OR for ID  abx beads  Possible vac  2. 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 Nicotine dependence/PSA             Discussed again that his nicotine use continues to further increase his risk of complications  tox screen pending                4. Medical issues             Reconcile home meds and resume after OR today   5. DVT and PE  prophylaxis               Foot pumps  6. ID             vancomycin and rifampin             Adjust based on cultures, cultures obtained at office so will not be on epic              If no organism id'd, pt may need picc  7.FEN             NPO  8. Dispo            OR  Continue IV abx   Mearl LatinKeith W. Berlynn Warsame, PA-C [facility] [phone] (P) 12/01/2013 10:03 AM

## 2013-12-01 NOTE — Anesthesia Preprocedure Evaluation (Addendum)
Anesthesia Evaluation  Patient identified by MRN, date of birth, ID band Patient awake    Reviewed: Allergy & Precautions, H&P , NPO status , Patient's Chart, lab work & pertinent test results  History of Anesthesia Complications (+) PONV and history of anesthetic complications  Airway Mallampati: I TM Distance: >3 FB Neck ROM: Full    Dental  (+) Edentulous Upper   Pulmonary Current Smoker, former smoker,          Cardiovascular     Neuro/Psych Anxiety Depression    GI/Hepatic GERD-  Medicated and Controlled,  Endo/Other    Renal/GU      Musculoskeletal   Abdominal   Peds  Hematology   Anesthesia Other Findings   Reproductive/Obstetrics                         Anesthesia Physical Anesthesia Plan  ASA: II  Anesthesia Plan: General   Post-op Pain Management:    Induction: Intravenous  Airway Management Planned: LMA  Additional Equipment:   Intra-op Plan:   Post-operative Plan: Extubation in OR  Informed Consent: I have reviewed the patients History and Physical, chart, labs and discussed the procedure including the risks, benefits and alternatives for the proposed anesthesia with the patient or authorized representative who has indicated his/her understanding and acceptance.   Dental advisory given  Plan Discussed with: Surgeon and CRNA  Anesthesia Plan Comments:        Anesthesia Quick Evaluation

## 2013-12-01 NOTE — Anesthesia Procedure Notes (Signed)
Procedure Name: LMA Insertion Date/Time: 12/01/2013 1:04 PM Performed by: Arlice ColtMANESS, Lillie Bollig B Pre-anesthesia Checklist: Patient identified, Emergency Drugs available, Suction available, Patient being monitored and Timeout performed Patient Re-evaluated:Patient Re-evaluated prior to inductionOxygen Delivery Method: Circle system utilized Preoxygenation: Pre-oxygenation with 100% oxygen Intubation Type: IV induction LMA: LMA inserted LMA Size: 4.0 Number of attempts: 1 Placement Confirmation: positive ETCO2 and breath sounds checked- equal and bilateral Tube secured with: Tape Dental Injury: Teeth and Oropharynx as per pre-operative assessment

## 2013-12-01 NOTE — Brief Op Note (Signed)
11/30/2013 - 12/01/2013  2:18 PM  PATIENT:  [name]  38 y.o. male  PRE-OPERATIVE DIAGNOSIS:  LEFT LEFT INFECTION  POST-OPERATIVE DIAGNOSIS:  LEFT LEG INFECTION  PROCEDURE:  Procedure(s): 1. IRRIGATION AND DEBRIDEMENT LEFT LEG (Left) 2. Placement antibiotic beads 3. Placement of large wound vac  SURGEON:  Surgeon(s) and Role:    * Budd PalmerMichael H Dayanara Sherrill, MD - Primary  PHYSICIAN ASSISTANT: Montez MoritaKeith Paul, PA-C  ANESTHESIA:   general  I/O:  Total I/O In: 1000 [I.V.:1000] Out: 1100 [Urine:1100]  SPECIMEN:  Source of Specimen:  deep tissues left leg  DISPOSITION OF SPECIMEN:  To micro  TOURNIQUET:  * No tourniquets in log *  DICTATION: .Other Dictation: Dictation Number [id number]

## 2013-12-02 ENCOUNTER — Encounter (HOSPITAL_COMMUNITY): Payer: Self-pay | Admitting: Orthopedic Surgery

## 2013-12-02 LAB — THC (MARIJUANA), URINE, CONFIRMATION: Marijuana, Ur-Confirmation: 37 ng/mL

## 2013-12-02 NOTE — Progress Notes (Signed)
[facility] Progress Note  Subjective  No new issues Doing ok C/o pain in Left leg VAC functioning   Objective   BP 104/53  Pulse 78  Temp(Src) 97.4 F (36.3 C) (Axillary)  Resp 20  Ht 5\' 8"  (1.727 m)  Wt 86.183 kg (190 lb)  BMI 28.90 kg/m2  SpO2 96%  Intake/Output     02/26 0701 - 02/27 0700 02/27 0701 - 02/28 0700   P.O. 1080 240   I.V. (mL/kg) 2410 (28)    Total Intake(mL/kg) 3490 (40.5) 240 (2.8)   Urine (mL/kg/hr) 3150 (1.5) 625 (1.3)   Drains 110 (0.1)    Blood 50 (0)    Total Output 3310 625   Net +180 -385          Labs  Results for IZAK, ANDING (MRN [id number]) as of 12/02/2013 12:32  Ref. Range 11/30/2013 18:46  DRUGS OF ABUSE SCREEN W ALC, ROUTINE URINE No range found Rpt (A)  Amphetamine Screen, Ur Latest Range: Negative  NEGATIVE  Barbiturate Quant, Ur Latest Range: Negative  NEGATIVE  Opiate Screen, Urine Latest Range: Negative  POSITIVE (A)  Benzodiazepines. Latest Range: Negative  POSITIVE (A)  Cocaine Metabolites Latest Range: Negative  NEGATIVE  Marijuana Metabolite Latest Range: Negative  POSITIVE (A)  Marijuana, Ur-Confirmation Latest Range: Cutoff:15 ng/mL 37  Phencyclidine (PCP) Latest Range: Negative  NEGATIVE  Methadone Latest Range: Negative  NEGATIVE  Propoxyphene Latest Range: Negative  NEGATIVE  Ethyl Alcohol Latest Range: <10 mg/dL <08   Exam  Gen: resting comfortably in bed, NAD  Ext:       Left Lower Extremity  Dressing c/d/i  VAC functioning appropriately   Distal motor and sensory functions at baseline  Foot/ankle resting in flexed position  Ext warm  + DP pulse     Assessment and Plan   POD/HD#: 1   38 y/o male s/p repair of L proximal tibia nonunion with deep infection   1. Soft tissue infection L leg            continue with vanc  Cultures pending  NWB L leg  Return to OR Monday for repeat I&D and closure   Pt may need picc depending on culture             2. L proximal tibia nonunion s/p  repair 12 days ago             NWB L leg             Out of bed ad lib with walker  3. Nicotine dependence/PSA            discussed negative effects again!  Continued nicotine and drug use may lead to amputation      4. Medical issues             home meds  5. DVT and PE prophylaxis               Foot pumps  lovenox   6. ID             vancomycin and rifampin             Adjust based on cultures, cultures obtained at office so will not be on epic               If no organism id'd, pt may need picc  7.FEN  As tolerated               8.  Dispo            OR on Monday              Continue IV abx   OOB ad lib     Mearl LatinKeith W. Emmanuell Kantz, PA-C [facility] [phone] (P) 12/02/2013 12:31 PM

## 2013-12-02 NOTE — Anesthesia Postprocedure Evaluation (Signed)
  Anesthesia Post-op Note  Patient: Linda HedgesLarry Hereford  Procedure(s) Performed: Procedure(s): IRRIGATION AND DEBRIDEMENT LEFT LEG (Left)  Patient Location: Nursing Unit  Anesthesia Type:General  Level of Consciousness: awake, alert  and oriented  Airway and Oxygen Therapy: Patient Spontanous Breathing  Post-op Pain: moderate  Post-op Assessment: Post-op Vital signs reviewed, Patient's Cardiovascular Status Stable, Respiratory Function Stable, Patent Airway, No signs of Nausea or vomiting and Adequate PO intake  Post-op Vital Signs: Reviewed and stable  Complications: No apparent anesthesia complications
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
## 2013-12-03 LAB — VANCOMYCIN, TROUGH: Vancomycin Tr: 10 ug/mL (ref 10.0–20.0)

## 2013-12-03 NOTE — Progress Notes (Signed)
I participated in the care of this patient and agree with the above history, physical and evaluation. I performed a review of the history and a physical exam as detailed   Eyvette Cordon Daniel Quida Glasser MD  

## 2013-12-03 NOTE — Progress Notes (Signed)
Patient ID: [name] HedgesLarry Pierce, male   DOB: 1976-01-01, 38 y.o.   MRN: [id number]     Subjective:  Patient reports pain as mild to moderate.  Patient states that the pain in the knee is some what worse today but bearable.  Objective:   VITALS:   Filed Vitals:   12/02/13 0559 12/02/13 1322 12/02/13 2010 12/03/13 0529  BP: 104/53 119/70 130/78 99/62  Pulse: 78 94 116 93  Temp: 97.4 F (36.3 C) 97.4 F (36.3 C) 98 F (36.7 C) 98.1 F (36.7 C)  TempSrc: Axillary Oral Oral Oral  Resp: 20 19 20 20   Height:      Weight:      SpO2: 96% 100% 97% 96%    ABD soft Sensation intact distally Dorsiflexion/Plantar flexion intact Incision: scant drainage   Lab Results  Component Value Date   WBC 9.8 11/30/2013   HGB 13.3 11/30/2013   HCT 39.9 11/30/2013   MCV 89.9 11/30/2013   PLT 498* 11/30/2013     Assessment/Plan: 2 Days Post-Op   Active Problems:   Soft tissue infection   Advance diet Up with therapy Plan for surg. On Mon with Dr Corrie MckusickHandy    [name], [name] 12/03/2013, 9:39 AM   [name] Ranaimothy Murphy MD [phone]

## 2013-12-03 NOTE — Progress Notes (Signed)
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 Flexeril [Cyclobenzaprine] Other (See Comments)    Makes him extremely agitated.   [name]. Penicillins Itching and Rash   Patient Measurements: Height: 5\' 8"  (172.7 cm) Weight: 190 lb (86.183 kg) IBW/kg (Calculated) : 68.4  Vital Signs: Temp: 98.1 F (36.7 C) (02/28 1310) Temp src: Oral (02/28 1310) BP: 132/82 mmHg (02/28 1310) Pulse Rate: 100 (02/28 1310) Intake/Output from previous day: 02/27 0701 - 02/28 0700 In: 2410 [P.O.:1500; I.V.:910] Out: 2895 [Urine:2825; Drains:70] Intake/Output from this shift: Total I/O In: 1066 [P.O.:900; I.V.:166] Out: 800 [Urine:800]  Labs:  Recent Labs  11/30/13 1837  WBC 9.8  HGB 13.3  PLT 498*  CREATININE 1.09   Estimated Creatinine Clearance: 98.1 ml/min (by C-G formula based on Cr of 1.09).  Recent Labs  12/03/13 1625  VANCOTROUGH 10.0    Assessment: 37yom continues on day #4 vancomycin/rifampin for left leg infection s/p I&D and placement of antibiotic beads. Vancomycin trough is at goal. Appears this morning's dose may have been missed (not charted as given). Renal function stable.  2/25 vancomycin>> 2/25 rifampin>> 2/13 left tibia wound>> negative 2/26 left leg wound>> ngtd  Goal of Therapy:  Vancomycin trough level 10-15 mcg/ml  Plan:  1) Continue vancomycin 1g IV q12 2) Continue to follow renal function  Fredrik RiggerMarkle, Willetta York Sue 12/03/2013,5:43 PM

## 2013-12-04 LAB — TISSUE CULTURE: Culture: NO GROWTH
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
## 2013-12-04 NOTE — Progress Notes (Signed)
Patient ID: [name], male   DOB: 1975/11/17, 38 y.o.   MRN: [id number]     Subjective:  Patient reports pain as mild to moderate.  Patient sitting up in bed in no acute distress.  Objective:   VITALS:   Filed Vitals:   12/03/13 0943 12/03/13 1310 12/03/13 2105 12/04/13 0517  BP: 124/70 132/82 144/77 123/66  Pulse: 100 100 88 95  Temp: 97.5 F (36.4 C) 98.1 F (36.7 C) 98.4 F (36.9 C) 97.7 F (36.5 C)  TempSrc: Oral Oral Oral Oral  Resp: 18 20 18 18   Height:      Weight:      SpO2: 98% 98% 99% 98%    ABD soft Sensation intact distally Dorsiflexion/Plantar flexion intact Incision: scant drainage   Lab Results  Component Value Date   WBC 9.8 11/30/2013   HGB 13.3 11/30/2013   HCT 39.9 11/30/2013   MCV 89.9 11/30/2013   PLT 498* 11/30/2013     Assessment/Plan: 3 Days Post-Op   Active Problems:   Soft tissue infection   Advance diet until NPO after midnight  Patient scheduled for surg with Dr Carola FrostHandy tomorrow Continue plan per medicine    Torrie MayersUGLAS Gennavieve Huq, Apolinar JunesBRANDON 12/04/2013, 9:14 AM   Margarita Ranaimothy Murphy MD [phone]

## 2013-12-05 ENCOUNTER — Encounter (HOSPITAL_COMMUNITY): Payer: Self-pay | Admitting: Certified Registered Nurse Anesthetist

## 2013-12-05 ENCOUNTER — Encounter (HOSPITAL_COMMUNITY): Payer: Medicare Other | Admitting: Anesthesiology

## 2013-12-05 ENCOUNTER — Inpatient Hospital Stay (HOSPITAL_COMMUNITY): Payer: Medicare Other | Admitting: Anesthesiology

## 2013-12-05 ENCOUNTER — Encounter (HOSPITAL_COMMUNITY)
Admission: AD | Disposition: A | Discharge status: Home Health | Payer: Self-pay | Source: Ambulatory Visit | Attending: Orthopedic Surgery

## 2013-12-05 HISTORY — PX: I & D EXTREMITY: SHX5045

## 2013-12-05 LAB — BENZODIAZEPINE, QUANTITATIVE, URINE
Alprazolam (GC/LC/MS), ur confirm: NEGATIVE ng/mL
Alprazolam metabolite (GC/LC/MS), ur confirm: NEGATIVE ng/mL
Clonazepam metabolite (GC/LC/MS), ur confirm: NEGATIVE ng/mL
DIAZEPAMUC: NEGATIVE ng/mL
ESTAZOLAMU: NEGATIVE ng/mL
Flunitrazepam metabolite (GC/LC/MS), ur confirm: NEGATIVE ng/mL
Flurazepam GC/MS Conf: NEGATIVE ng/mL
LORAZEPAMU: NEGATIVE ng/mL
Midazolam (GC/LC/MS), ur confirm: NEGATIVE ng/mL
Nordiazepam GC/MS Conf: 524 ng/mL
OXAZEPAM GC/MS CONF: 1596 ng/mL
Temazepam GC/MS Conf: 489 ng/mL
Triazolam metabolite (GC/LC/MS), ur confirm: NEGATIVE ng/mL

## 2013-12-05 LAB — OPIATE, QUANTITATIVE, URINE
6 MONOACETYLMORPHINE, UR-CONFIRM: NEGATIVE ng/mL
Codeine Urine: NEGATIVE ng/mL
Hydrocodone: 247 ng/mL
Hydromorphone GC/MS Conf: NEGATIVE ng/mL
Morphine, Confirm: 163 ng/mL
Norhydrocodone, Ur: 625 ng/mL
Noroxycodone, Ur: 1224 ng/mL
OXYCODONE, UR: 593 ng/mL
OXYMORPHONE: NEGATIVE ng/mL

## 2013-12-05 SURGERY — IRRIGATION AND DEBRIDEMENT EXTREMITY
Anesthesia: General | Site: Leg Lower | Laterality: Left

## 2013-12-05 MED ORDER — PROPOFOL 10 MG/ML IV BOLUS
INTRAVENOUS | Status: DC | PRN
  Administered 2013-12-05: 200 mg via INTRAVENOUS

## 2013-12-05 MED ORDER — FENTANYL CITRATE 0.05 MG/ML IJ SOLN
INTRAMUSCULAR | Status: AC
  Filled 2013-12-05: qty 5

## 2013-12-05 MED ORDER — ARTIFICIAL TEARS OP OINT
TOPICAL_OINTMENT | OPHTHALMIC | Status: AC
  Filled 2013-12-05: qty 3.5

## 2013-12-05 MED ORDER — FENTANYL CITRATE 0.05 MG/ML IJ SOLN
100.0000 ug | Freq: Once | INTRAMUSCULAR | Status: AC
  Administered 2013-12-05: 100 ug via INTRAVENOUS

## 2013-12-05 MED ORDER — HYDROMORPHONE HCL PF 1 MG/ML IJ SOLN
0.2500 mg | INTRAMUSCULAR | Status: DC | PRN
  Administered 2013-12-05 (×4): 0.5 mg via INTRAVENOUS

## 2013-12-05 MED ORDER — MIDAZOLAM HCL 2 MG/2ML IJ SOLN
INTRAMUSCULAR | Status: AC
  Administered 2013-12-05: 2 mg
  Filled 2013-12-05: qty 2

## 2013-12-05 MED ORDER — OXYCODONE-ACETAMINOPHEN 5-325 MG PO TABS
ORAL_TABLET | ORAL | Status: AC
  Administered 2013-12-05: 2 via ORAL
  Filled 2013-12-05: qty 2

## 2013-12-05 MED ORDER — METHOCARBAMOL 500 MG PO TABS
ORAL_TABLET | ORAL | Status: AC
  Filled 2013-12-05: qty 1

## 2013-12-05 MED ORDER — MIDAZOLAM HCL 5 MG/5ML IJ SOLN
INTRAMUSCULAR | Status: DC | PRN
  Administered 2013-12-05: 4 mg via INTRAVENOUS

## 2013-12-05 MED ORDER — OXYCODONE HCL 5 MG/5ML PO SOLN: 5.0000 mg | Freq: Once | ORAL | Status: DC | PRN

## 2013-12-05 MED ORDER — 0.9 % SODIUM CHLORIDE (POUR BTL) OPTIME
TOPICAL | Status: DC | PRN
  Administered 2013-12-05: 1000 mL

## 2013-12-05 MED ORDER — METOCLOPRAMIDE HCL 5 MG/ML IJ SOLN
10.0000 mg | Freq: Once | INTRAMUSCULAR | Status: DC | PRN
  Administered 2013-12-05: 10 mg via INTRAVENOUS

## 2013-12-05 MED ORDER — MIDAZOLAM HCL 2 MG/2ML IJ SOLN
INTRAMUSCULAR | Status: AC
  Filled 2013-12-05: qty 2

## 2013-12-05 MED ORDER — METOCLOPRAMIDE HCL 5 MG/ML IJ SOLN
INTRAMUSCULAR | Status: AC
  Administered 2013-12-05: 10 mg via INTRAVENOUS
  Filled 2013-12-05: qty 2

## 2013-12-05 MED ORDER — HYDROMORPHONE HCL PF 1 MG/ML IJ SOLN
INTRAMUSCULAR | Status: AC
  Administered 2013-12-05: 0.5 mg via INTRAVENOUS
  Filled 2013-12-05: qty 2

## 2013-12-05 MED ORDER — DEXAMETHASONE SODIUM PHOSPHATE 4 MG/ML IJ SOLN
INTRAMUSCULAR | Status: DC | PRN
  Administered 2013-12-05: 8 mg via INTRAVENOUS

## 2013-12-05 MED ORDER — FENTANYL CITRATE 0.05 MG/ML IJ SOLN
INTRAMUSCULAR | Status: AC
  Filled 2013-12-05: qty 2

## 2013-12-05 MED ORDER — LIDOCAINE HCL (CARDIAC) 20 MG/ML IV SOLN
INTRAVENOUS | Status: AC
  Filled 2013-12-05: qty 5

## 2013-12-05 MED ORDER — OXYCODONE HCL 5 MG PO TABS: 5.0000 mg | ORAL_TABLET | Freq: Once | ORAL | Status: DC | PRN

## 2013-12-05 MED ORDER — DEXAMETHASONE SODIUM PHOSPHATE 4 MG/ML IJ SOLN
INTRAMUSCULAR | Status: AC
  Filled 2013-12-05: qty 2

## 2013-12-05 MED ORDER — METHOCARBAMOL 500 MG PO TABS
1000.0000 mg | ORAL_TABLET | Freq: Once | ORAL | Status: AC
  Administered 2013-12-05: 1000 mg via ORAL

## 2013-12-05 MED ORDER — FENTANYL CITRATE 0.05 MG/ML IJ SOLN
INTRAMUSCULAR | Status: DC | PRN
  Administered 2013-12-05: 50 ug via INTRAVENOUS
  Administered 2013-12-05: 150 ug via INTRAVENOUS
  Administered 2013-12-05 (×2): 50 ug via INTRAVENOUS
  Administered 2013-12-05 (×2): 100 ug via INTRAVENOUS

## 2013-12-05 MED ORDER — ONDANSETRON HCL 4 MG/2ML IJ SOLN
INTRAMUSCULAR | Status: DC | PRN
  Administered 2013-12-05: 4 mg via INTRAVENOUS

## 2013-12-05 MED ORDER — LACTATED RINGERS IV SOLN
INTRAVENOUS | Status: DC | PRN
  Administered 2013-12-05: 16:00:00 via INTRAVENOUS

## 2013-12-05 MED ORDER — PROPOFOL 10 MG/ML IV BOLUS
INTRAVENOUS | Status: AC
  Filled 2013-12-05: qty 20

## 2013-12-05 MED ORDER — LACTATED RINGERS IV SOLN
INTRAVENOUS | Status: DC
  Administered 2013-12-05: 14:00:00 via INTRAVENOUS

## 2013-12-05 SURGICAL SUPPLY — 46 items
BANDAGE ELASTIC 4 VELCRO ST LF (GAUZE/BANDAGES/DRESSINGS) ×6 IMPLANT
BANDAGE GAUZE ELAST BULKY 4 IN (GAUZE/BANDAGES/DRESSINGS) ×3 IMPLANT
BLADE SURG 10 STRL SS (BLADE) ×3 IMPLANT
BNDG COHESIVE 4X5 TAN STRL (GAUZE/BANDAGES/DRESSINGS) ×3 IMPLANT
BNDG GAUZE STRTCH 6 (GAUZE/BANDAGES/DRESSINGS) ×9 IMPLANT
BRUSH SCRUB DISP (MISCELLANEOUS) ×6 IMPLANT
COVER SURGICAL LIGHT HANDLE (MISCELLANEOUS) ×6 IMPLANT
DRAPE INCISE IOBAN 66X45 STRL (DRAPES) ×3 IMPLANT
DRAPE U-SHAPE 47X51 STRL (DRAPES) ×3 IMPLANT
DRSG ADAPTIC 3X8 NADH LF (GAUZE/BANDAGES/DRESSINGS) ×3 IMPLANT
DRSG PAD ABDOMINAL 8X10 ST (GAUZE/BANDAGES/DRESSINGS) ×3 IMPLANT
ELECT CAUTERY BLADE 6.4 (BLADE) IMPLANT
ELECT REM PT RETURN 9FT ADLT (ELECTROSURGICAL)
ELECTRODE REM PT RTRN 9FT ADLT (ELECTROSURGICAL) IMPLANT
GLOVE BIO SURGEON STRL SZ7.5 (GLOVE) ×3 IMPLANT
GLOVE BIO SURGEON STRL SZ8 (GLOVE) ×3 IMPLANT
GLOVE BIOGEL PI IND STRL 7.5 (GLOVE) ×1 IMPLANT
GLOVE BIOGEL PI IND STRL 8 (GLOVE) ×1 IMPLANT
GLOVE BIOGEL PI INDICATOR 7.5 (GLOVE) ×2
GLOVE BIOGEL PI INDICATOR 8 (GLOVE) ×2
GOWN STRL REUS W/ TWL LRG LVL3 (GOWN DISPOSABLE) ×2 IMPLANT
GOWN STRL REUS W/ TWL XL LVL3 (GOWN DISPOSABLE) ×1 IMPLANT
GOWN STRL REUS W/TWL LRG LVL3 (GOWN DISPOSABLE) ×4
GOWN STRL REUS W/TWL XL LVL3 (GOWN DISPOSABLE) ×2
HANDPIECE INTERPULSE COAX TIP (DISPOSABLE)
KIT BASIN OR (CUSTOM PROCEDURE TRAY) ×3 IMPLANT
KIT ROOM TURNOVER OR (KITS) ×3 IMPLANT
MANIFOLD NEPTUNE II (INSTRUMENTS) ×3 IMPLANT
NS IRRIG 1000ML POUR BTL (IV SOLUTION) ×3 IMPLANT
PACK ORTHO EXTREMITY (CUSTOM PROCEDURE TRAY) ×3 IMPLANT
PAD ARMBOARD 7.5X6 YLW CONV (MISCELLANEOUS) ×6 IMPLANT
PADDING CAST COTTON 6X4 STRL (CAST SUPPLIES) ×3 IMPLANT
SET HNDPC FAN SPRY TIP SCT (DISPOSABLE) IMPLANT
SPONGE GAUZE 4X4 12PLY (GAUZE/BANDAGES/DRESSINGS) ×3 IMPLANT
SPONGE LAP 18X18 X RAY DECT (DISPOSABLE) ×3 IMPLANT
STOCKINETTE IMPERVIOUS 9X36 MD (GAUZE/BANDAGES/DRESSINGS) ×3 IMPLANT
SUT ETHILON 2 0 FS 18 (SUTURE) ×9 IMPLANT
SUT PDS AB 2-0 CT1 27 (SUTURE) ×6 IMPLANT
TOWEL OR 17X24 6PK STRL BLUE (TOWEL DISPOSABLE) ×3 IMPLANT
TOWEL OR 17X26 10 PK STRL BLUE (TOWEL DISPOSABLE) ×6 IMPLANT
TUBE ANAEROBIC SPECIMEN COL (MISCELLANEOUS) IMPLANT
TUBE CONNECTING 12'X1/4 (SUCTIONS) ×1
TUBE CONNECTING 12X1/4 (SUCTIONS) ×2 IMPLANT
UNDERPAD 30X30 INCONTINENT (UNDERPADS AND DIAPERS) ×3 IMPLANT
WATER STERILE IRR 1000ML POUR (IV SOLUTION) ×3 IMPLANT
YANKAUER SUCT BULB TIP NO VENT (SUCTIONS) ×3 IMPLANT

## 2013-12-05 NOTE — Anesthesia Preprocedure Evaluation (Signed)
Anesthesia Evaluation  Patient identified by MRN, date of birth, ID band Patient awake    Reviewed: Allergy & Precautions, H&P , NPO status , Patient's Chart, lab work & pertinent test results, reviewed documented beta blocker date and time   History of Anesthesia Complications (+) PONV and history of anesthetic complications  Airway Mallampati: II TM Distance: >3 FB Neck ROM: full    Dental   Pulmonary former smoker,  breath sounds clear to auscultation        Cardiovascular + Peripheral Vascular Disease Rhythm:regular     Neuro/Psych PSYCHIATRIC DISORDERS Anxiety Depression  Neuromuscular disease    GI/Hepatic negative GI ROS, GERD-  Medicated and Controlled,(+)     substance abuse  marijuana use,   Endo/Other  negative endocrine ROS  Renal/GU negative Renal ROS  negative genitourinary   Musculoskeletal   Abdominal   Peds  Hematology negative hematology ROS (+)   Anesthesia Other Findings See surgeon's H&P   Reproductive/Obstetrics negative OB ROS                           Anesthesia Physical Anesthesia Plan  ASA: III  Anesthesia Plan: General   Post-op Pain Management:    Induction: Intravenous  Airway Management Planned: LMA  Additional Equipment:   Intra-op Plan:   Post-operative Plan:   Informed Consent: I have reviewed the patients History and Physical, chart, labs and discussed the procedure including the risks, benefits and alternatives for the proposed anesthesia with the patient or authorized representative who has indicated his/her understanding and acceptance.   Dental Advisory Given  Plan Discussed with: CRNA and Surgeon  Anesthesia Plan Comments:         Anesthesia Quick Evaluation

## 2013-12-05 NOTE — Brief Op Note (Addendum)
11/30/2013 - 12/05/2013  4:42 PM  PATIENT:  [name]  38 y.o. male  PRE-OPERATIVE DIAGNOSIS:  Left Leg Wound  POST-OPERATIVE DIAGNOSIS:  Left Leg Wound  PROCEDURE:  Procedure(s) with comments: REPEAT IRRIGATION AND DEBRIDEMENT EXTREMITY (Left) - irrigation and closure of wounds layered 14cm with retention sutures  SURGEON:  Surgeon(s) and Role:    * Budd PalmerMichael H Rojean Ige, MD - Primary  PHYSICIAN ASSISTANT: Montez MoritaKeith Paul, PA-C  ANESTHESIA:   general  I/O:  Total I/O In: 800 [I.V.:800] Out: 1765 [Urine:1750; Blood:15]  SPECIMEN:  No Specimen  TOURNIQUET:  * No tourniquets in log *  DICTATION: .Other Dictation: Dictation Number complete

## 2013-12-05 NOTE — Transfer of Care (Signed)
Immediate Anesthesia Transfer of Care Note  Patient: [name]  Procedure(s) Performed: Procedure(s) with comments: REPEAT IRRIGATION AND DEBRIDEMENT EXTREMITY (Left) - irrigation and closure of wounds   Patient Location: PACU  Anesthesia Type:General  Level of Consciousness: awake, alert , oriented and patient cooperative  Airway & Oxygen Therapy: Patient Spontanous Breathing and Patient connected to nasal cannula oxygen  Post-op Assessment: Report given to PACU RN, Post -op Vital signs reviewed and stable and Patient moving all extremities X 4  Post vital signs: Reviewed and stable  Complications: No apparent anesthesia complications

## 2013-12-05 NOTE — Preoperative (Signed)
Beta Blockers   Reason not to administer Beta Blockers:Not Applicable 

## 2013-12-05 NOTE — Progress Notes (Signed)
Pt has had multiple doses of pain meds with minimal relief. P.A. Notified. Robaxin given as ordered

## 2013-12-05 NOTE — Progress Notes (Signed)
I discussed with the patient the risks and benefits of surgery, including the possibility of failure to resolve infection, nerve injury, vessel injury, wound breakdown, arthritis, symptomatic hardware, DVT/ PE, loss of motion, and need for further surgery among others.  We also once more discussed the risk of amputation.  He understood these risks and wished to proceed.  Myrene GalasMichael Carrol Bondar, MD [facility] [phone] (p)

## 2013-12-05 NOTE — Anesthesia Postprocedure Evaluation (Signed)
Anesthesia Post Note  Patient: Linda HedgesLarry Rattan  Procedure(s) Performed: Procedure(s) (LRB): REPEAT IRRIGATION AND DEBRIDEMENT EXTREMITY (Left)  Anesthesia type: general  Patient location: PACU  Post pain: Pain level controlled  Post assessment: Patient's Cardiovascular Status Stable  Post vital signs: Reviewed and stable  Level of consciousness: sedated  Complications: No apparent anesthesia complications

## 2013-12-05 NOTE — Progress Notes (Signed)
[facility] Progress Note  Subjective  Doing ok No issues  Events overnight noted     Objective   BP 108/71  Pulse 83  Temp(Src) 98.6 F (37 C) (Oral)  Resp 18  Ht 5\' 8"  (1.727 m)  Wt 86.183 kg (190 lb)  BMI 28.90 kg/m2  SpO2 96%  Intake/Output     03/01 0701 - 03/02 0700 03/02 0701 - 03/03 0700   P.O. 1980    I.V. 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 Soft tissue infection L leg            continue with vanc and rifampin             Cultures without growth              NWB L leg             Return to OR today for repeat I&D and closure               Pt may need picc depending on culture- need to check final cultures that were obtained at the office               2. 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 Nicotine dependence/PSA            discussed negative effects again!             Continued nicotine and drug use may lead to amputation      4. Medical issues             home meds  5. DVT and PE prophylaxis               Foot pumps             lovenox   6. ID             vancomycin and rifampin             Adjust based on cultures, cultures obtained at office so will not be on epic               If no organism id'd, pt may need picc  7.FEN            NPO              8. Dispo            OR              Continue IV abx  OOB ad lib    Dc home tomorrow     Mearl Latin,  PA-C [facility] [phone] (P) 12/05/2013 9:07 AM

## 2013-12-06 DIAGNOSIS — S8290XS Unspecified fracture of unspecified lower leg, sequela: Secondary | ICD-10-CM

## 2013-12-06 DIAGNOSIS — IMO0002 Reserved for concepts with insufficient information to code with codable children: Secondary | ICD-10-CM

## 2013-12-06 DIAGNOSIS — Y849 Medical procedure, unspecified as the cause of abnormal reaction of the patient, or of later complication, without mention of misadventure at the time of the procedure: Secondary | ICD-10-CM

## 2013-12-06 DIAGNOSIS — T8133XA Disruption of traumatic injury wound repair, initial encounter: Secondary | ICD-10-CM

## 2013-12-06 LAB — ANAEROBIC CULTURE

## 2013-12-06 MED ORDER — DEXTROSE 5 % IV SOLN
2.0000 g | INTRAVENOUS | Status: DC
  Administered 2013-12-06: 2 g via INTRAVENOUS
  Filled 2013-12-06: qty 2

## 2013-12-06 MED ORDER — SODIUM CHLORIDE 0.9 % IJ SOLN
10.0000 mL | INTRAMUSCULAR | Status: DC | PRN
  Administered 2013-12-06: 10 mL

## 2013-12-06 MED ORDER — HEPARIN SOD (PORK) LOCK FLUSH 100 UNIT/ML IV SOLN
250.0000 [IU] | INTRAVENOUS | Status: AC | PRN
  Administered 2013-12-06: 18:00:00

## 2013-12-06 MED ORDER — VANCOMYCIN HCL IN DEXTROSE 1-5 GM/200ML-% IV SOLN: 1000.0000 mg | Freq: Two times a day (BID) | INTRAVENOUS | Status: DC

## 2013-12-06 MED ORDER — OXYCODONE HCL 5 MG PO TABS: 5.0000 mg | ORAL_TABLET | ORAL | Status: DC | PRN

## 2013-12-06 MED ORDER — DEXTROSE 5 % IV SOLN: 2.0000 g | INTRAVENOUS | Status: DC

## 2013-12-06 MED ORDER — RIFAMPIN 300 MG PO CAPS: 600.0000 mg | ORAL_CAPSULE | Freq: Every day | ORAL | Status: DC

## 2013-12-06 MED ORDER — HYDROCODONE-ACETAMINOPHEN 10-325 MG PO TABS: 1.0000 | ORAL_TABLET | Freq: Four times a day (QID) | ORAL | Status: DC | PRN
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
## 2013-12-06 NOTE — Discharge Instructions (Signed)
[facility] Discharge Instructions,   General Discharge Instructions  WEIGHT BEARING STATUS: Nonweightbearing Left Leg  RANGE OF MOTION/ACTIVITY: Range of motion left knee and ankle as tolerated. Wear gray boot at all times except for when doing range of motion   Wound Care:  Change dressing in 4 days (12/10/2013), clean with soap and water only!! No oinments, lotion or solutions like alcohol, hydrogen peroxide, etc. These damage new cells and prevent healing. See wound care instructions below   Diet: as you were eating previously.  Can use over the counter stool softeners and bowel preparations, such as Miralax, to help with bowel movements.  Narcotics can be constipating.  Be sure to drink plenty of fluids  STOP SMOKING OR USING NICOTINE PRODUCTS!!!!  As discussed nicotine severely impairs your body's ability to heal surgical and traumatic wounds but also impairs bone healing.  Wounds and bone heal by forming microscopic blood vessels (angiogenesis) and nicotine is a vasoconstrictor (essentially, shrinks blood vessels).  Therefore, if vasoconstriction occurs to these microscopic blood vessels they essentially disappear and are unable to deliver necessary nutrients to the healing tissue.  This is one modifiable factor that you can do to dramatically increase your chances of healing your injury.    (This means no smoking, no nicotine gum, patches, etc)  DO NOT USE NONSTEROIDAL ANTI-INFLAMMATORY DRUGS (NSAID'S)  Using products such as Advil (ibuprofen), Aleve (naproxen), Motrin (ibuprofen) for additional pain control during fracture healing can delay and/or prevent the healing response.  If you would like to take over the counter (OTC) medication, Tylenol (acetaminophen) is ok.  However, some narcotic medications that are given for pain control contain acetaminophen as well. Therefore, you should not exceed more than 4000 mg of tylenol in a day if you do not have liver disease.  Also  note that there are may OTC medicines, such as cold medicines and allergy medicines that my contain tylenol as well.  If you have any questions about medications and/or interactions please ask your doctor/PA or your pharmacist.   PAIN MEDICATION USE AND EXPECTATIONS  You have likely been given narcotic medications to help control your pain.  After a traumatic event that results in an fracture (broken bone) with or without surgery, it is ok to use narcotic pain medications to help control one's pain.  We understand that everyone responds to pain differently and each individual patient will be evaluated on a regular basis for the continued need for narcotic medications. Ideally, narcotic medication use should last no more than 6-8 weeks (coinciding with fracture healing).   As a patient it is your responsibility as well to monitor narcotic medication use and report the amount and frequency you use these medications when you come to your office visit.   We would also advise that if you are using narcotic medications, you should take a dose prior to therapy to maximize you participation.  IF YOU ARE ON NARCOTIC MEDICATIONS IT IS NOT PERMISSIBLE TO OPERATE A MOTOR VEHICLE (MOTORCYCLE/CAR/TRUCK/MOPED) OR HEAVY MACHINERY DO NOT MIX NARCOTICS WITH OTHER CNS (CENTRAL NERVOUS SYSTEM) DEPRESSANTS SUCH AS ALCOHOL       ICE AND ELEVATE INJURED/OPERATIVE EXTREMITY  Using ice and elevating the injured extremity above your heart can help with swelling and pain control.  Icing in a pulsatile fashion, such as 20 minutes on and 20 minutes off, can be followed.    Do not place ice directly on skin. Make sure there is a barrier between to skin and the  ice pack.    Using frozen items such as frozen peas works well as the conform nicely to the are that needs to be iced.  USE AN ACE WRAP OR TED HOSE FOR SWELLING CONTROL  In addition to icing and elevation, Ace wraps or TED hose are used to help limit and resolve  swelling.  It is recommended to use Ace wraps or TED hose until you are informed to stop.    When using Ace Wraps start the wrapping distally (farthest away from the body) and wrap proximally (closer to the body)   Example: If you had surgery on your leg or thing and you do not have a splint on, start the ace wrap at the toes and work your way up to the thigh        If you had surgery on your upper extremity and do not have a splint on, start the ace wrap at your fingers and work your way up to the upper arm  IF YOU ARE IN A SPLINT OR CAST DO NOT REMOVE IT FOR ANY REASON   If your splint gets wet for any reason please contact the office immediately. You may shower in your splint or cast as long as you keep it dry.  This can be done by wrapping in a cast cover or garbage back (or similar)  Do Not stick any thing down your splint or cast such as pencils, money, or hangers to try and scratch yourself with.  If you feel itchy take benadryl as prescribed on the bottle for itching  IF YOU ARE IN A CAM BOOT (BLACK BOOT)  You may remove boot periodically. Perform daily dressing changes as noted below.  Wash the liner of the boot regularly and wear a sock when wearing the boot. It is recommended that you sleep in the boot until told otherwise  CALL THE OFFICE WITH ANY QUESTIONS OR CONCERTS: [phone]     Discharge Pin Site Instructions  Dress pins daily with Kerlix roll starting on POD 2. Wrap the Kerlix so that it tamps the skin down around the pin-skin interface to prevent/limit motion of the skin relative to the pin.  (Pin-skin motion is the primary cause of pain and infection related to external fixator pin sites).  Remove any crust or coagulum that may obstruct drainage with a saline moistened gauze or soap and water.  After POD 3, if there is no discernable drainage on the pin site dressing, the interval for change can by increased to every other day.  You may shower with the fixator,  cleaning all pin sites gently with soap and water.  If you have a surgical wound this needs to be completely dry and without drainage before showering.  The extremity can be lifted by the fixator to facilitate wound care and transfers.  Notify the office/Doctor if you experience increasing drainage, redness, or pain from a pin site, or if you notice purulent (thick, snot-like) drainage.  Discharge Wound Care Instructions  Do NOT apply any ointments, solutions or lotions to pin sites or surgical wounds.  These prevent needed drainage and even though solutions like hydrogen peroxide kill bacteria, they also damage cells lining the pin sites that help fight infection.  Applying lotions or ointments can keep the wounds moist and can cause them to breakdown and open up as well. This can increase the risk for infection. When in doubt call the office.  Surgical incisions should be dressed daily.  If any  drainage is noted, use one layer of adaptic, then gauze, Kerlix, and an ace wrap.  Once the incision is completely dry and without drainage, it may be left open to air out.  Showering may begin 36-48 hours later.  Cleaning gently with soap and water.  Traumatic wounds should be dressed daily as well.    One layer of adaptic, gauze, Kerlix, then ace wrap.  The adaptic can be discontinued once the draining has ceased    If you have a wet to dry dressing: wet the gauze with saline the squeeze as much saline out so the gauze is moist (not soaking wet), place moistened gauze over wound, then place a dry gauze over the moist one, followed by Kerlix wrap, then ace wrap.

## 2013-12-06 NOTE — Progress Notes (Signed)
Peripherally Inserted Central Catheter/Midline Placement  The IV Nurse has discussed with the patient and/or persons authorized to consent for the patient, the purpose of this procedure and the potential benefits and risks involved with this procedure.  The benefits include less needle sticks, lab draws from the catheter and patient may be discharged home with the catheter.  Risks include, but not limited to, infection, bleeding, blood clot (thrombus formation), and puncture of an artery; nerve damage and irregular heat beat.  Alternatives to this procedure were also discussed.  PICC/Midline Placement Documentation        Timmothy Soursewman, Lyam Provencio Renee 12/06/2013, 10:07 AM

## 2013-12-06 NOTE — Discharge Summary (Signed)
Orthopaedic Trauma Service (OTS)  Patient ID: [name] MRN: [id number] DOB/AGE: 1976-08-10 38 y.o.  Admit date: 11/30/2013 Discharge date: 12/08/2013  Admission Diagnoses:   Left Leg osteomyelitis   Nonunion L proximal tibia   Chronic pain   Neuropathy of left lower extremity   GERD (gastroesophageal reflux disease)   Left foot drop   Alcohol dependence   Major depression   Polysubstance abuse   Soft tissue infection    Discharge Diagnoses:  Principal Problem:   Osteomyelitis Active Problems:   Chronic back pain   Neuropathy of left lower extremity   GERD (gastroesophageal reflux disease)   Left foot drop   Alcohol dependence   Major depression   Polysubstance abuse   Soft tissue infection   Procedures Performed: 12/01/2013- Dr. Carola Frost  1. Incision and drainage of left tibia nonunion. 2. Placement of antibiotic beads. 3. Placement of wound VAC.  12/05/2013- Dr. Carola Frost  Irrigation and delayed layered closure L leg    Discharged Condition: stable  Hospital Course:   A 38 year old white male well-known to the orthopedic trauma service admitted for osteomyelitis and wound infection of his left proximal tibia. Patient was admitted for serial debridements. Cultures did not grow out any specific bacteria and therefore a PICC line was inserted to administer IV antibiotics for 6 weeks. Patient's hospital stay was uncomplicated. No particular issues were noted. Patient was deemed stable for discharge on 12/06/2013. He will be discharged home with home health RN to assist with IV antibiotic administration.  Consults: ID- Dr. Luciana Axe   Significant Diagnostic Studies: labs:   Micro- no growth from intra-op cultures     Treatments: IV hydration, antibiotics: vancomycin, ceftriaxone and rifampin, analgesia: Dilaudid and oxycodone, perococet, anticoagulation: LMW heparin, therapies: PT, OT and RN, procedures: PICC line and surgery: as above   Discharge Exam:   Orthopaedic Trauma Service Progress Note  Subjective  Doing well No new issues Pain controlled   Objective   BP 148/82  Pulse 106  Temp(Src) 98.5 F (36.9 C) (Oral)  Resp 18  Ht 5\' 8"  (1.727 m)  Wt 86.183 kg (190 lb)  BMI 28.90 kg/m2  SpO2 99%  Intake/Output     03/02 0701 - 03/03 0700 03/03 0701 - 03/04 0700    P.O. 240     I.V. (mL/kg) 800 (9.3)     Total Intake(mL/kg) 1040 (12.1)     Urine (mL/kg/hr) 2850 (1.4)     Drains      Blood 15 (0)     Total Output 2865      Net -1825              Labs   No new labs   Exam  Gen: sleeping, easily arousable, NAD Lungs: unlabored Cardiac: s1 and s2 Abd: + BS, NT Ext:        Left Lower Extremity                       Dressing c/d/i             Distal motor and sensory functions at baseline             Ext warm             + DP pulse             Swelling stable                Assessment and Plan  POD/HD#: 1   38 y/o male s/p repair of L proximal tibia nonunion with deep infection   1. Soft tissue infection L leg            continue with vanc and rifampin             Cultures without growth               NWB L leg             PICC pending             ID consult pending             Suspect 6 weeks of IV abx                2. 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 Nicotine dependence/PSA            discussed negative effects again!             Continued nicotine and drug use may lead to amputation      4. Medical issues             home meds  5. DVT and PE prophylaxis               Foot pumps             lovenox               Will not need pharmacologic prophylaxis at discharge   6. ID             vancomycin and rifampin             no growth on hospital and office cultures   7.FEN            diet as tolerated            8. Dispo            PICC               HHRN for IV therapies             Possible dc home  later this afternoon      [name] Latin, PA-C [facility] [phone] (P) 12/06/2013 8:45 AM   Disposition: 01-Home or Self Care      Discharge Orders   Future Appointments Provider Department Dept Phone   12/20/2013 11:15 AM Judyann Munson, MD [facility] [phone]   12/26/2013 2:00 PM [facility] [place] [facility] [address] [phone]   12/26/2013 2:30 PM [facility] [place] [facility] [address] [phone]   12/26/2013 3:00 PM Carma Lair Nickel, NP [facility] [phone]   Future Orders Complete By Expires   Call MD / Call 911  As directed    Comments:     If you experience chest pain or shortness of breath, CALL 911 and be transported to the hospital emergency room.  If you develope a fever above 101 F, pus (white drainage) or increased drainage or redness at the wound, or calf pain, call your surgeon's office.   Constipation Prevention  As directed    Comments:     Drink plenty  of fluids.  Prune juice may be helpful.  You may use a stool softener, such as Colace (over the counter) 100 mg twice a day.  Use MiraLax (over the counter) for constipation as needed.   Diet general  As directed    Discharge instructions  As directed    Comments:     Orthopaedic Trauma Service Discharge Instructions,   General Discharge Instructions  WEIGHT BEARING STATUS: Nonweightbearing Left Leg  RANGE OF MOTION/ACTIVITY: Range of motion left knee and ankle as tolerated. Wear gray boot at all times except for when doing range of motion   Wound Care:  Change dressing in 4 days (12/10/2013), clean with soap and water only!! No oinments, lotion or solutions like alcohol, hydrogen peroxide, etc. These damage new cells and prevent healing. See wound care instructions below   Diet: as you were eating previously.  Can use over the counter stool softeners and bowel preparations, such as  Miralax, to help with bowel movements.  Narcotics can be constipating.  Be sure to drink plenty of fluids  STOP SMOKING OR USING NICOTINE PRODUCTS!!!!  As discussed nicotine severely impairs your body's ability to heal surgical and traumatic wounds but also impairs bone healing.  Wounds and bone heal by forming microscopic blood vessels (angiogenesis) and nicotine is a vasoconstrictor (essentially, shrinks blood vessels).  Therefore, if vasoconstriction occurs to these microscopic blood vessels they essentially disappear and are unable to deliver necessary nutrients to the healing tissue.  This is one modifiable factor that you can do to dramatically increase your chances of healing your injury.    (This means no smoking, no nicotine gum, patches, etc)  DO NOT USE NONSTEROIDAL ANTI-INFLAMMATORY DRUGS (NSAID'S)  Using products such as Advil (ibuprofen), Aleve (naproxen), Motrin (ibuprofen) for additional pain control during fracture healing can delay and/or prevent the healing response.  If you would like to take over the counter (OTC) medication, Tylenol (acetaminophen) is ok.  However, some narcotic medications that are given for pain control contain acetaminophen as well. Therefore, you should not exceed more than 4000 mg of tylenol in a day if you do not have liver disease.  Also note that there are may OTC medicines, such as cold medicines and allergy medicines that my contain tylenol as well.  If you have any questions about medications and/or interactions please ask your doctor/PA or your pharmacist.   PAIN MEDICATION USE AND EXPECTATIONS  You have likely been given narcotic medications to help control your pain.  After a traumatic event that results in an fracture (broken bone) with or without surgery, it is ok to use narcotic pain medications to help control one's pain.  We understand that everyone responds to pain differently and each individual patient will be evaluated on a regular basis for the  continued need for narcotic medications. Ideally, narcotic medication use should last no more than 6-8 weeks (coinciding with fracture healing).   As a patient it is your responsibility as well to monitor narcotic medication use and report the amount and frequency you use these medications when you come to your office visit.   We would also advise that if you are using narcotic medications, you should take a dose prior to therapy to maximize you participation.  IF YOU ARE ON NARCOTIC MEDICATIONS IT IS NOT PERMISSIBLE TO OPERATE A MOTOR VEHICLE (MOTORCYCLE/CAR/TRUCK/MOPED) OR HEAVY MACHINERY DO NOT MIX NARCOTICS WITH OTHER CNS (CENTRAL NERVOUS SYSTEM) DEPRESSANTS SUCH AS ALCOHOL       ICE AND  ELEVATE INJURED/OPERATIVE EXTREMITY  Using ice and elevating the injured extremity above your heart can help with swelling and pain control.  Icing in a pulsatile fashion, such as 20 minutes on and 20 minutes off, can be followed.    Do not place ice directly on skin. Make sure there is a barrier between to skin and the ice pack.    Using frozen items such as frozen peas works well as the conform nicely to the are that needs to be iced.  USE AN ACE WRAP OR TED HOSE FOR SWELLING CONTROL  In addition to icing and elevation, Ace wraps or TED hose are used to help limit and resolve swelling.  It is recommended to use Ace wraps or TED hose until you are informed to stop.    When using Ace Wraps start the wrapping distally (farthest away from the body) and wrap proximally (closer to the body)   Example: If you had surgery on your leg or thing and you do not have a splint on, start the ace wrap at the toes and work your way up to the thigh        If you had surgery on your upper extremity and do not have a splint on, start the ace wrap at your fingers and work your way up to the upper arm  IF YOU ARE IN A SPLINT OR CAST DO NOT REMOVE IT FOR ANY REASON   If your splint gets wet for any reason please contact the  office immediately. You may shower in your splint or cast as long as you keep it dry.  This can be done by wrapping in a cast cover or garbage back (or similar)  Do Not stick any thing down your splint or cast such as pencils, money, or hangers to try and scratch yourself with.  If you feel itchy take benadryl as prescribed on the bottle for itching  IF YOU ARE IN A CAM BOOT (BLACK BOOT)  You may remove boot periodically. Perform daily dressing changes as noted below.  Wash the liner of the boot regularly and wear a sock when wearing the boot. It is recommended that you sleep in the boot until told otherwise  CALL THE OFFICE WITH ANY QUESTIONS OR CONCERTS: [phone]     Discharge Pin Site Instructions  Dress pins daily with Kerlix roll starting on POD 2. Wrap the Kerlix so that it tamps the skin down around the pin-skin interface to prevent/limit motion of the skin relative to the pin.  (Pin-skin motion is the primary cause of pain and infection related to external fixator pin sites).  Remove any crust or coagulum that may obstruct drainage with a saline moistened gauze or soap and water.  After POD 3, if there is no discernable drainage on the pin site dressing, the interval for change can by increased to every other day.  You may shower with the fixator, cleaning all pin sites gently with soap and water.  If you have a surgical wound this needs to be completely dry and without drainage before showering.  The extremity can be lifted by the fixator to facilitate wound care and transfers.  Notify the office/Doctor if you experience increasing drainage, redness, or pain from a pin site, or if you notice purulent (thick, snot-like) drainage.  Discharge Wound Care Instructions  Do NOT apply any ointments, solutions or lotions to pin sites or surgical wounds.  These prevent needed drainage and even though solutions like hydrogen  peroxide kill bacteria, they also damage cells lining the pin  sites that help fight infection.  Applying lotions or ointments can keep the wounds moist and can cause them to breakdown and open up as well. This can increase the risk for infection. When in doubt call the office.  Surgical incisions should be dressed daily.  If any drainage is noted, use one layer of adaptic, then gauze, Kerlix, and an ace wrap.  Once the incision is completely dry and without drainage, it may be left open to air out.  Showering may begin 36-48 hours later.  Cleaning gently with soap and water.  Traumatic wounds should be dressed daily as well.    One layer of adaptic, gauze, Kerlix, then ace wrap.  The adaptic can be discontinued once the draining has ceased    If you have a wet to dry dressing: wet the gauze with saline the squeeze as much saline out so the gauze is moist (not soaking wet), place moistened gauze over wound, then place a dry gauze over the moist one, followed by Kerlix wrap, then ace wrap.   Driving restrictions  As directed    Comments:     No driving   Increase activity slowly as tolerated  As directed    Non weight bearing  As directed    Questions:     Laterality:     Extremity:         Medication List         amitriptyline 50 MG tablet  Commonly known as:  ELAVIL  Take 1 tablet (50 mg total) by mouth at bedtime. For depression/insomnia     baclofen 10 MG tablet  Commonly known as:  LIORESAL  Take 1 tablet (10 mg total) by mouth 3 (three) times daily. For muscle pain/spasms     dextrose 5 % SOLN 50 mL with cefTRIAXone 2 G SOLR 2 g  Inject 2 g into the vein daily.     doxepin 150 MG capsule  Commonly known as:  SINEQUAN  Take 1 capsule (150 mg total) by mouth at bedtime. For     DSS 100 MG Caps  Take 100 mg by mouth 2 (two) times daily.     esomeprazole 40 MG capsule  Commonly known as:  NEXIUM  Take 1 capsule (40 mg total) by mouth daily. For acid reflux     gabapentin 300 MG capsule  Commonly known as:  NEURONTIN  Take 600  mg by mouth 3 (three) times daily.     HYDROcodone-acetaminophen 10-325 MG per tablet  Commonly known as:  NORCO  Take 1-2 tablets by mouth every 6 (six) hours as needed for moderate pain or severe pain.     hydrOXYzine 50 MG tablet  Commonly known as:  ATARAX/VISTARIL  Take 50 mg by mouth at bedtime.     oxyCODONE 5 MG immediate release tablet  Commonly known as:  Oxy IR/ROXICODONE  Take 1-2 tablets (5-10 mg total) by mouth every 3 (three) hours as needed for breakthrough pain (take between hydrocodone for breakthrough pain).     rifampin 300 MG capsule  Commonly known as:  RIFADIN  Take 2 capsules (600 mg total) by mouth daily.     vancomycin 1 GM/200ML Soln  Commonly known as:  VANCOCIN  Inject 200 mLs (1,000 mg total) into the vein every 12 (twelve) hours.       Follow-up Information   Follow up with Budd Palmer, MD. Schedule an appointment as  soon as possible for a visit in 7 days.   Specialty:  Orthopedic Surgery   Contact information:   [address] [place] [address] [phone]       Follow up with Staci Righter, MD. Schedule an appointment as soon as possible for a visit in 6 weeks.   Specialty:  Infectious Diseases   Contact information:   [address] [place] [address] [phone]       Discharge Instructions and Plan:  Pt has a significant issue with his Left leg.  By definition he does have osteomyelitis of his L tibia. This is further complicated by his nonunion as well We were able to adequately debride his soft tissue and bone and place abx beads Pt will remain on IV abx, vancomycin and rocephin, as well as rifampin for 6 weeks PICC line has been placed Pt will dc home with HH to assist with IV abx administration  Pt has been given explicit instructions regarding wound care as well  Pt is still at risk for limb loss, particularly if he continues to smoke and use nicotine  Pt will follow up in 7 days for  wound check He will follow up with ID in 6 weeks, weekly labs to be sent to ID as well   Signed:  Mearl Latin, PA-C [facility] [phone] (P) 12/08/2013, 11:33 AM

## 2013-12-06 NOTE — Progress Notes (Signed)
Orthopaedic Trauma Service Progress Note  Subjective  Doing well No new issues Pain controlled   Objective   BP 148/82  Pulse 106  Temp(Src) 98.5 F (36.9 C) (Oral)  Resp 18  Ht 5\' 8"  (1.727 m)  Wt 86.183 kg (190 lb)  BMI 28.90 kg/m2  SpO2 99%  Intake/Output     03/02 0701 - 03/03 0700 03/03 0701 - 03/04 0700   P.O. 240    I.V. (mL/kg) 800 (9.3)    Total Intake(mL/kg) 1040 (12.1)    Urine (mL/kg/hr) 2850 (1.4)    Drains     Blood 15 (0)    Total Output 2865     Net -1825            Labs   No new labs   Exam  Gen: sleeping, easily arousable, NAD Lungs: unlabored Cardiac: s1 and s2 Abd: + BS, NT Ext:       Left Lower Extremity   Dressing c/d/i  Distal motor and sensory functions at baseline  Ext warm  + DP pulse  Swelling stable     Assessment and Plan   POD/HD#: 1   38 y/o male s/p repair of L proximal tibia nonunion with deep infection   1. Soft tissue infection L leg            continue with vanc and rifampin             Cultures without growth               NWB L leg             PICC pending  ID consult pending  Suspect 6 weeks of IV abx               2. 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 Nicotine dependence/PSA            discussed negative effects again!             Continued nicotine and drug use may lead to amputation      4. Medical issues             home meds  5. DVT and PE prophylaxis               Foot pumps             lovenox   Will not need pharmacologic prophylaxis at discharge   6. ID             vancomycin and rifampin             no growth on hospital and office cultures   7.FEN            diet as tolerated            8. Dispo            PICC   HHRN for IV therapies  Possible dc home later this afternoon     Mearl LatinKeith W. Caleah Tortorelli, PA-C [facility] [phone] (P) 12/06/2013 8:45 AM

## 2013-12-06 NOTE — Care Management Note (Signed)
  CARE MANAGEMENT NOTE 12/06/2013  Patient:  [name] HedgesCOX,[name]   Account Number:  [id number]  Date Initiated:  12/06/2013  Documentation initiated by:  [name] PeperBRADY,[name]  Subjective/Objective Assessment:   38 yr old male admitted with soft tissue infection.s/p I & D. Patient will go home on IV antibiotics.     Action/Plan:   Choice was offered. [facility], [name].[name]. PICC line placed.Cm relayed message concerning patient  [facility]   Anticipated DC Date:  12/06/2013   Anticipated DC Plan:  HOME W HOME HEALTH SERVICES      DC Planning Services  CM consult      [facility]   Choice offered to / List presented to:  C-1 Patient        HH arranged  HH-1 RN      [facility]   Status of service:  Completed, signed off Medicare Important Message given?   (If response is "NO", the following Medicare IM given date fields will be blank) Date Medicare IM given:   Date Additional Medicare IM given:    Discharge Disposition:  HOME W HOME HEALTH SERVICES  Per UR Regulation:    If discussed at Long Length of Stay Meetings, dates discussed:    Comments:  12/06/13 11:39AM [name] PeperSusan Yannely Kintzel, RN BSN Case Manager [phone]  For [facility]: Patient states he will be going to his parent's home: [address]  [place], [place] [address] His cell is: [phone]

## 2013-12-07 ENCOUNTER — Encounter (HOSPITAL_COMMUNITY): Payer: Self-pay | Admitting: Orthopedic Surgery

## 2013-12-07 NOTE — Addendum Note (Signed)
Addendum created 12/07/13 [id number] by Sharee Holstererry Analys Ryden, MD   Modules edited: Anesthesia Attestations

## 2013-12-08 ENCOUNTER — Inpatient Hospital Stay (HOSPITAL_COMMUNITY): Admission: AD | Admit: 2013-12-08 | Payer: Medicare Other | Source: Ambulatory Visit | Admitting: Orthopedic Surgery

## 2013-12-15 ENCOUNTER — Telehealth: Payer: Self-pay | Admitting: *Deleted

## 2013-12-15 NOTE — Telephone Encounter (Signed)
Unable to leave message.  Voice mail not set up.

## 2013-12-20 ENCOUNTER — Ambulatory Visit (INDEPENDENT_AMBULATORY_CARE_PROVIDER_SITE_OTHER): Payer: Medicare Other | Admitting: Internal Medicine

## 2013-12-20 ENCOUNTER — Encounter: Payer: Self-pay | Admitting: Internal Medicine

## 2013-12-20 VITALS — BP 105/68 | HR 112 | Temp 98.8°F

## 2013-12-20 DIAGNOSIS — M869 Osteomyelitis, unspecified: Secondary | ICD-10-CM

## 2013-12-20 LAB — CBC WITH DIFFERENTIAL/PLATELET
BASOS PCT: 1 % (ref 0–1)
Basophils Absolute: 0.1 10*3/uL (ref 0.0–0.1)
Eosinophils Absolute: 0.7 10*3/uL (ref 0.0–0.7)
Eosinophils Relative: 6 % — ABNORMAL HIGH (ref 0–5)
HCT: 40.9 % (ref 39.0–52.0)
Hemoglobin: 13.9 g/dL (ref 13.0–17.0)
Lymphocytes Relative: 23 % (ref 12–46)
Lymphs Abs: 2.6 10*3/uL (ref 0.7–4.0)
MCH: 29.5 pg (ref 26.0–34.0)
MCHC: 34 g/dL (ref 30.0–36.0)
MCV: 86.8 fL (ref 78.0–100.0)
Monocytes Absolute: 0.9 10*3/uL (ref 0.1–1.0)
Monocytes Relative: 8 % (ref 3–12)
Neutro Abs: 7.1 10*3/uL (ref 1.7–7.7)
Neutrophils Relative %: 62 % (ref 43–77)
Platelets: 282 10*3/uL (ref 150–400)
RBC: 4.71 MIL/uL (ref 4.22–5.81)
RDW: 14.5 % (ref 11.5–15.5)
WBC: 11.4 10*3/uL — ABNORMAL HIGH (ref 4.0–10.5)

## 2013-12-20 LAB — COMPLETE METABOLIC PANEL WITH GFR
ALBUMIN: 4.3 g/dL (ref 3.5–5.2)
ALK PHOS: 178 U/L — AB (ref 39–117)
ALT: 16 U/L (ref 0–53)
AST: 15 U/L (ref 0–37)
BUN: 11 mg/dL (ref 6–23)
CO2: 25 mEq/L (ref 19–32)
Calcium: 9.4 mg/dL (ref 8.4–10.5)
Chloride: 107 mEq/L (ref 96–112)
Creat: 0.84 mg/dL (ref 0.50–1.35)
GFR, Est African American: >89 mL/min
GLUCOSE: 106 mg/dL — AB (ref 70–99)
Potassium: 4.5 mEq/L (ref 3.5–5.3)
Sodium: 139 mEq/L (ref 135–145)
TOTAL PROTEIN: 7.4 g/dL (ref 6.0–8.3)
Total Bilirubin: 0.5 mg/dL (ref 0.2–1.2)

## 2013-12-20 NOTE — Addendum Note (Signed)
Addended by: Lurlean LeydenPOOLE, TRAVIS F on: 12/20/2013 04:20 PM   Modules accepted: Orders
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
## 2013-12-23 ENCOUNTER — Encounter: Payer: Self-pay | Admitting: Family

## 2013-12-26 ENCOUNTER — Other Ambulatory Visit (HOSPITAL_COMMUNITY): Payer: Medicare Other

## 2013-12-26 ENCOUNTER — Ambulatory Visit: Payer: Medicare Other | Admitting: Family

## 2013-12-26 ENCOUNTER — Inpatient Hospital Stay (HOSPITAL_COMMUNITY): Admission: RE | Admit: 2013-12-26 | Payer: Medicare Other | Source: Ambulatory Visit

## 2013-12-26 ENCOUNTER — Encounter (HOSPITAL_COMMUNITY): Payer: Medicare Other

## 2014-01-12 ENCOUNTER — Encounter: Payer: Self-pay | Admitting: Internal Medicine

## 2014-01-12 ENCOUNTER — Ambulatory Visit (INDEPENDENT_AMBULATORY_CARE_PROVIDER_SITE_OTHER): Payer: Medicare Other | Admitting: Internal Medicine

## 2014-01-12 ENCOUNTER — Encounter: Payer: Self-pay | Admitting: Family

## 2014-01-12 VITALS — BP 131/84 | HR 118 | Temp 98.3°F | Wt 199.0 lb

## 2014-01-12 DIAGNOSIS — M869 Osteomyelitis, unspecified: Secondary | ICD-10-CM

## 2014-01-12 DIAGNOSIS — R197 Diarrhea, unspecified: Secondary | ICD-10-CM

## 2014-01-12 LAB — BASIC METABOLIC PANEL WITH GFR
BUN: 15 mg/dL (ref 6–23)
CO2: 26 meq/L (ref 19–32)
CREATININE: 1.06 mg/dL (ref 0.50–1.35)
Calcium: 9.7 mg/dL (ref 8.4–10.5)
Chloride: 101 mEq/L (ref 96–112)
GFR, Est African American: >89 mL/min
GFR, Est Non African American: 89 mL/min
GLUCOSE: 95 mg/dL (ref 70–99)
Potassium: 4.6 mEq/L (ref 3.5–5.3)
Sodium: 138 mEq/L (ref 135–145)

## 2014-01-12 LAB — CBC WITH DIFFERENTIAL/PLATELET
BASOS PCT: 1 % (ref 0–1)
Basophils Absolute: 0.1 10*3/uL (ref 0.0–0.1)
EOS ABS: 0.3 10*3/uL (ref 0.0–0.7)
EOS PCT: 3 % (ref 0–5)
HEMATOCRIT: 46 % (ref 39.0–52.0)
Hemoglobin: 16.3 g/dL (ref 13.0–17.0)
Lymphocytes Relative: 28 % (ref 12–46)
Lymphs Abs: 3.1 10*3/uL (ref 0.7–4.0)
MCH: 30.6 pg (ref 26.0–34.0)
MCHC: 35.4 g/dL (ref 30.0–36.0)
MCV: 86.5 fL (ref 78.0–100.0)
MONO ABS: 0.9 10*3/uL (ref 0.1–1.0)
Monocytes Relative: 8 % (ref 3–12)
Neutro Abs: 6.6 10*3/uL (ref 1.7–7.7)
Neutrophils Relative %: 60 % (ref 43–77)
Platelets: 365 10*3/uL (ref 150–400)
RBC: 5.32 MIL/uL (ref 4.22–5.81)
RDW: 14.4 % (ref 11.5–15.5)
WBC: 11 10*3/uL — ABNORMAL HIGH (ref 4.0–10.5)

## 2014-01-12 LAB — C-REACTIVE PROTEIN: CRP: 0.6 mg/dL — ABNORMAL HIGH (ref <0.60)
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
## 2014-01-13 ENCOUNTER — Encounter: Payer: Self-pay | Admitting: Family

## 2014-01-13 ENCOUNTER — Ambulatory Visit (INDEPENDENT_AMBULATORY_CARE_PROVIDER_SITE_OTHER): Payer: Medicare Other | Admitting: Family

## 2014-01-13 ENCOUNTER — Ambulatory Visit (HOSPITAL_COMMUNITY)
Admission: RE | Admit: 2014-01-13 | Discharge: 2014-01-13 | Disposition: A | Discharge status: Home / Self Care | Payer: Medicare Other | Source: Ambulatory Visit | Attending: Family | Admitting: Family

## 2014-01-13 ENCOUNTER — Ambulatory Visit (INDEPENDENT_AMBULATORY_CARE_PROVIDER_SITE_OTHER)
Admission: RE | Admit: 2014-01-13 | Discharge: 2014-01-13 | Disposition: A | Discharge status: Home / Self Care | Payer: Medicare Other | Source: Ambulatory Visit | Attending: Family | Admitting: Family

## 2014-01-13 VITALS — BP 128/84 | HR 110 | Resp 18 | Ht 69.0 in | Wt 190.0 lb

## 2014-01-13 DIAGNOSIS — Z09 Encounter for follow-up examination after completed treatment for conditions other than malignant neoplasm: Secondary | ICD-10-CM

## 2014-01-13 DIAGNOSIS — Z48812 Encounter for surgical aftercare following surgery on the circulatory system: Secondary | ICD-10-CM | POA: Insufficient documentation

## 2014-01-13 DIAGNOSIS — S85009A Unspecified injury of popliteal artery, unspecified leg, initial encounter: Secondary | ICD-10-CM | POA: Insufficient documentation

## 2014-01-13 DIAGNOSIS — X58XXXA Exposure to other specified factors, initial encounter: Secondary | ICD-10-CM | POA: Insufficient documentation

## 2014-01-13 LAB — SEDIMENTATION RATE: Sed Rate: 8 mm/hr (ref 0–16)

## 2014-01-13 NOTE — Patient Instructions (Signed)
Smoking Cessation Quitting smoking is important to your health and has many advantages. However, it is not always easy to quit since nicotine is a very addictive drug. Often times, people try 3 times or more before being able to quit. This document explains the best ways for you to prepare to quit smoking. Quitting takes hard work and a lot of effort, but you can do it. ADVANTAGES OF QUITTING SMOKING  You will live longer, feel better, and live better.  Your body will feel the impact of quitting smoking almost immediately.  Within 20 minutes, blood pressure decreases. Your pulse returns to its normal level.  After 8 hours, carbon monoxide levels in the blood return to normal. Your oxygen level increases.  After 24 hours, the chance of having a heart attack starts to decrease. Your breath, hair, and body stop smelling like smoke.  After 48 hours, damaged nerve endings begin to recover. Your sense of taste and smell improve.  After 72 hours, the body is virtually free of nicotine. Your bronchial tubes relax and breathing becomes easier.  After 2 to 12 weeks, lungs can hold more air. Exercise becomes easier and circulation improves.  The risk of having a heart attack, stroke, cancer, or lung disease is greatly reduced.  After 1 year, the risk of coronary heart disease is cut in half.  After 5 years, the risk of stroke falls to the same as a nonsmoker.  After 10 years, the risk of lung cancer is cut in half and the risk of other cancers decreases significantly.  After 15 years, the risk of coronary heart disease drops, usually to the level of a nonsmoker.  If you are pregnant, quitting smoking will improve your chances of having a healthy baby.  The people you live with, especially any children, will be healthier.  You will have extra money to spend on things other than cigarettes. QUESTIONS TO THINK ABOUT BEFORE ATTEMPTING TO QUIT You may want to talk about your answers with your  caregiver.  Why do you want to quit?  If you tried to quit in the past, what helped and what did not?  What will be the most difficult situations for you after you quit? How will you plan to handle them?  Who can help you through the tough times? Your family? Friends? A caregiver?  What pleasures do you get from smoking? What ways can you still get pleasure if you quit? Here are some questions to ask your caregiver:  How can you help me to be successful at quitting?  What medicine do you think would be best for me and how should I take it?  What should I do if I need more help?  What is smoking withdrawal like? How can I get information on withdrawal? GET READY  Set a quit date.  Change your environment by getting rid of all cigarettes, ashtrays, matches, and lighters in your home, car, or work. Do not let people smoke in your home.  Review your past attempts to quit. Think about what worked and what did not. GET SUPPORT AND ENCOURAGEMENT You have a better chance of being successful if you have help. You can get support in many ways.  Tell your family, friends, and co-workers that you are going to quit and need their support. Ask them not to smoke around you.  Get individual, group, or telephone counseling and support. Programs are available at local hospitals and health centers. Call your local health department for   information about programs in your area.  Spiritual beliefs and practices may help some smokers quit.  Download a "quit meter" on your computer to keep track of quit statistics, such as how long you have gone without smoking, cigarettes not smoked, and money saved.  Get a self-help book about quitting smoking and staying off of tobacco. LEARN NEW SKILLS AND BEHAVIORS  Distract yourself from urges to smoke. Talk to someone, go for a walk, or occupy your time with a task.  Change your normal routine. Take a different route to work. Drink tea instead of coffee.  Eat breakfast in a different place.  Reduce your stress. Take a hot bath, exercise, or read a book.  Plan something enjoyable to do every day. Reward yourself for not smoking.  Explore interactive web-based programs that specialize in helping you quit. GET MEDICINE AND USE IT CORRECTLY Medicines can help you stop smoking and decrease the urge to smoke. Combining medicine with the above behavioral methods and support can greatly increase your chances of successfully quitting smoking.  Nicotine replacement therapy helps deliver nicotine to your body without the negative effects and risks of smoking. Nicotine replacement therapy includes nicotine gum, lozenges, inhalers, nasal sprays, and skin patches. Some may be available over-the-counter and others require a prescription.  Antidepressant medicine helps people abstain from smoking, but how this works is unknown. This medicine is available by prescription.  Nicotinic receptor partial agonist medicine simulates the effect of nicotine in your brain. This medicine is available by prescription. Ask your caregiver for advice about which medicines to use and how to use them based on your health history. Your caregiver will tell you what side effects to look out for if you choose to be on a medicine or therapy. Carefully read the information on the package. Do not use any other product containing nicotine while using a nicotine replacement product.  RELAPSE OR DIFFICULT SITUATIONS Most relapses occur within the first 3 months after quitting. Do not be discouraged if you start smoking again. Remember, most people try several times before finally quitting. You may have symptoms of withdrawal because your body is used to nicotine. You may crave cigarettes, be irritable, feel very hungry, cough often, get headaches, or have difficulty concentrating. The withdrawal symptoms are only temporary. They are strongest when you first quit, but they will go away within  10 14 days. To reduce the chances of relapse, try to:  Avoid drinking alcohol. Drinking lowers your chances of successfully quitting.  Reduce the amount of caffeine you consume. Once you quit smoking, the amount of caffeine in your body increases and can give you symptoms, such as a rapid heartbeat, sweating, and anxiety.  Avoid smokers because they can make you want to smoke.  Do not let weight gain distract you. Many smokers will gain weight when they quit, usually less than 10 pounds. Eat a healthy diet and stay active. You can always lose the weight gained after you quit.  Find ways to improve your mood other than smoking. FOR MORE INFORMATION  www.smokefree.gov  Document Released: 09/16/2001 Document Revised: 03/23/2012 Document Reviewed: 01/01/2012 [employer] Patient Information 2014 [employer]  
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
## 2014-01-13 NOTE — Addendum Note (Signed)
Addended by: Adria DillELDRIDGE-LEWIS, Amea Mcphail L on: 01/13/2014 04:39 PM   Modules accepted: Orders

## 2014-01-17 NOTE — Progress Notes (Signed)
Phone call to [facility].  Last dose of IV abx 01/17/14 then, pull the PICC 01/18/14.  [facility] back the order.

## 2014-01-18 ENCOUNTER — Telehealth: Payer: Self-pay | Admitting: Surgery

## 2014-01-18 NOTE — Telephone Encounter (Addendum)
Message copied by Fredrich BirksMILLIKAN, DANA P on Wed Jan 18, 2014 10:51 AM ------      Message from: Fredrich BirksMILLIKAN, DANA P      Created: Tue Jan 17, 2014  3:39 PM      Regarding: FW: call pt to return in 6 months instead of a year                   ----- Message -----         From: Annye RuskSuzanne L Nickel, NP         Sent: 01/16/2014   2:44 PM           To: [facility]      Subject: call pt to return in 6 months instead of a y#            After discussing his case with Dr. Myra GianottiBrabham, please call pt to return in 6 months instead of a year with the same testing.            Thank you,      Rosalita ChessmanSuzanne ------  01/18/14: spoke with pt to r.s to 07/2014 for follow up- mailed new appointment letter also, dpm

## 2014-01-30 ENCOUNTER — Telehealth: Payer: Self-pay | Admitting: *Deleted

## 2014-01-30 NOTE — Telephone Encounter (Addendum)
Left message for patient instructing him to stop rifampin.   Message copied by Andree CossHOWELL, MICHELLE M on Mon Jan 30, 2014 12:02 PM ------      Message from: Gardiner BarefootOMER, ROBERT W      Created: Mon Jan 30, 2014  9:54 AM       Please let the patient know that he can stop the rifampin, if he hasn't already.  thanks ------

## 2014-07-21 ENCOUNTER — Encounter: Payer: Self-pay | Admitting: Family

## 2014-07-24 ENCOUNTER — Encounter (HOSPITAL_COMMUNITY): Payer: Medicare Other

## 2014-07-24 ENCOUNTER — Ambulatory Visit: Payer: Medicare Other | Admitting: Family

## 2014-07-24 ENCOUNTER — Other Ambulatory Visit (HOSPITAL_COMMUNITY): Payer: Medicare Other

## 2015-01-15 ENCOUNTER — Encounter (HOSPITAL_COMMUNITY): Payer: Medicare Other

## 2015-01-15 ENCOUNTER — Other Ambulatory Visit (HOSPITAL_COMMUNITY): Payer: Medicare Other

## 2015-01-15 ENCOUNTER — Ambulatory Visit: Payer: Medicare Other | Admitting: Family

## 2016-02-21 ENCOUNTER — Ambulatory Visit: Payer: Medicare Other | Admitting: Sports Medicine

## 2016-02-27 ENCOUNTER — Ambulatory Visit (INDEPENDENT_AMBULATORY_CARE_PROVIDER_SITE_OTHER): Payer: Medicare PPO

## 2016-02-27 ENCOUNTER — Encounter: Payer: Self-pay | Admitting: Sports Medicine

## 2016-02-27 ENCOUNTER — Ambulatory Visit (INDEPENDENT_AMBULATORY_CARE_PROVIDER_SITE_OTHER): Payer: Medicare PPO | Admitting: Sports Medicine

## 2016-02-27 ENCOUNTER — Other Ambulatory Visit: Payer: Self-pay | Admitting: Sports Medicine

## 2016-02-27 DIAGNOSIS — M79673 Pain in unspecified foot: Secondary | ICD-10-CM

## 2016-02-27 DIAGNOSIS — Q828 Other specified congenital malformations of skin: Secondary | ICD-10-CM | POA: Diagnosis not present
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
## 2016-02-28 ENCOUNTER — Other Ambulatory Visit: Payer: Self-pay | Admitting: Sports Medicine

## 2016-02-28 DIAGNOSIS — M79672 Pain in left foot: Secondary | ICD-10-CM

## 2016-02-28 MED ORDER — IBUPROFEN 800 MG PO TABS: ORAL_TABLET | ORAL | Status: DC

## 2016-03-07 ENCOUNTER — Encounter: Payer: Self-pay | Admitting: Podiatry

## 2016-03-07 ENCOUNTER — Ambulatory Visit (INDEPENDENT_AMBULATORY_CARE_PROVIDER_SITE_OTHER): Payer: Medicare PPO | Admitting: Podiatry

## 2016-03-07 ENCOUNTER — Ambulatory Visit (INDEPENDENT_AMBULATORY_CARE_PROVIDER_SITE_OTHER): Payer: Medicare PPO

## 2016-03-07 ENCOUNTER — Telehealth: Payer: Self-pay | Admitting: *Deleted

## 2016-03-07 VITALS — BP 110/76 | HR 100 | Resp 12

## 2016-03-07 DIAGNOSIS — M79672 Pain in left foot: Secondary | ICD-10-CM

## 2016-03-07 DIAGNOSIS — L02619 Cutaneous abscess of unspecified foot: Secondary | ICD-10-CM | POA: Diagnosis not present

## 2016-03-07 DIAGNOSIS — L03119 Cellulitis of unspecified part of limb: Secondary | ICD-10-CM | POA: Diagnosis not present

## 2016-03-07 MED ORDER — CIPROFLOXACIN HCL 500 MG PO TABS: 500.0000 mg | ORAL_TABLET | Freq: Two times a day (BID) | ORAL | Status: DC

## 2016-03-07 MED ORDER — OXYCODONE HCL 5 MG PO TABS: 5.0000 mg | ORAL_TABLET | ORAL | Status: DC | PRN

## 2016-03-07 MED ORDER — DOXYCYCLINE HYCLATE 100 MG PO TABS: 100.0000 mg | ORAL_TABLET | Freq: Two times a day (BID) | ORAL | Status: DC

## 2016-03-07 NOTE — Telephone Encounter (Signed)
Pt states had bunion surgery with Dr.Stover over 1 week ago, now has more pain, redness to top of foot over surgical site and a sharp stabbing pain.  I transferred pt to schedulers to get in today.

## 2016-03-08 DIAGNOSIS — F17200 Nicotine dependence, unspecified, uncomplicated: Secondary | ICD-10-CM

## 2016-03-08 DIAGNOSIS — L03116 Cellulitis of left lower limb: Secondary | ICD-10-CM

## 2016-03-08 DIAGNOSIS — K219 Gastro-esophageal reflux disease without esophagitis: Secondary | ICD-10-CM

## 2016-03-08 DIAGNOSIS — F1920 Other psychoactive substance dependence, uncomplicated: Secondary | ICD-10-CM

## 2016-03-08 DIAGNOSIS — K279 Peptic ulcer, site unspecified, unspecified as acute or chronic, without hemorrhage or perforation: Secondary | ICD-10-CM | POA: Diagnosis not present

## 2016-03-08 DIAGNOSIS — F419 Anxiety disorder, unspecified: Secondary | ICD-10-CM

## 2016-03-08 DIAGNOSIS — A419 Sepsis, unspecified organism: Secondary | ICD-10-CM | POA: Diagnosis not present

## 2016-03-09 DIAGNOSIS — K279 Peptic ulcer, site unspecified, unspecified as acute or chronic, without hemorrhage or perforation: Secondary | ICD-10-CM | POA: Diagnosis not present

## 2016-03-09 DIAGNOSIS — L03116 Cellulitis of left lower limb: Secondary | ICD-10-CM | POA: Diagnosis not present

## 2016-03-09 DIAGNOSIS — K219 Gastro-esophageal reflux disease without esophagitis: Secondary | ICD-10-CM | POA: Diagnosis not present

## 2016-03-09 DIAGNOSIS — A419 Sepsis, unspecified organism: Secondary | ICD-10-CM | POA: Diagnosis not present

## 2016-03-10 DIAGNOSIS — K219 Gastro-esophageal reflux disease without esophagitis: Secondary | ICD-10-CM

## 2016-03-10 DIAGNOSIS — L03116 Cellulitis of left lower limb: Secondary | ICD-10-CM

## 2016-03-10 DIAGNOSIS — F1920 Other psychoactive substance dependence, uncomplicated: Secondary | ICD-10-CM

## 2016-03-10 DIAGNOSIS — K279 Peptic ulcer, site unspecified, unspecified as acute or chronic, without hemorrhage or perforation: Secondary | ICD-10-CM

## 2016-03-10 DIAGNOSIS — F419 Anxiety disorder, unspecified: Secondary | ICD-10-CM

## 2016-03-10 DIAGNOSIS — F17200 Nicotine dependence, unspecified, uncomplicated: Secondary | ICD-10-CM

## 2016-03-10 DIAGNOSIS — G629 Polyneuropathy, unspecified: Secondary | ICD-10-CM

## 2016-03-10 NOTE — Progress Notes (Signed)
Subjective:     Patient ID: Scarlette ShortsLarry H Borum, male   DOB: 04/08/1976, 40 y.o.   MRN: [id number]  HPI patient presents after having a biopsy of the left fifth metatarsal with redness in the forefoot left 2 day duration and states that he is having no systemic signs of infection. He does have discomfort in this area and his temperature today was 98.5   Review of Systems     Objective:   Physical Exam Neurovascular status found to be unchanged from previous visit with patient found to have erythema extending into the left forefoot around the fifth metatarsal localized in nature. On the plantar aspect of the foot there is some irritated tissue but it is localized to this area with no active drainage or proximal erythema edema noted. Patient has no systemic indications of infection currently    Assessment:     Possibility for abscess or cellulitis extending from a previous biopsy done fifth metatarsal left by Dr. Marylene LandStover    Plan:     H&P and condition reviewed with patient. At this point I debrided plantar tissue and I did not note active drainage but I did go ahead and do a culture of the area. I placed into a wedge shoe to reduce all pressure against this part of the foot and I went ahead and placed on antibiotics doxycycline twice a day and Cipro twice a day. I gave strict instructions if he should develop any systemic signs of infection or proximal erythema edema or temperature he is to go straight to the emergency room and contact us. Patient will be seen back by Dr. Marylene LandStover on Monday and I also reviewed x-rays  X-rays did not show any signs of ostial lysis currently and were reviewed

## 2016-03-12 ENCOUNTER — Encounter: Payer: Self-pay | Admitting: Sports Medicine

## 2016-03-12 ENCOUNTER — Ambulatory Visit (INDEPENDENT_AMBULATORY_CARE_PROVIDER_SITE_OTHER): Payer: Medicare PPO | Admitting: Sports Medicine

## 2016-03-12 DIAGNOSIS — M79672 Pain in left foot: Secondary | ICD-10-CM

## 2016-03-12 DIAGNOSIS — K219 Gastro-esophageal reflux disease without esophagitis: Secondary | ICD-10-CM | POA: Diagnosis not present

## 2016-03-12 DIAGNOSIS — L02619 Cutaneous abscess of unspecified foot: Secondary | ICD-10-CM

## 2016-03-12 DIAGNOSIS — L03119 Cellulitis of unspecified part of limb: Secondary | ICD-10-CM

## 2016-03-12 DIAGNOSIS — Z09 Encounter for follow-up examination after completed treatment for conditions other than malignant neoplasm: Secondary | ICD-10-CM | POA: Diagnosis not present

## 2016-03-12 DIAGNOSIS — L03116 Cellulitis of left lower limb: Secondary | ICD-10-CM

## 2016-03-12 DIAGNOSIS — F419 Anxiety disorder, unspecified: Secondary | ICD-10-CM | POA: Diagnosis not present

## 2016-03-12 DIAGNOSIS — F17200 Nicotine dependence, unspecified, uncomplicated: Secondary | ICD-10-CM | POA: Diagnosis not present

## 2016-03-12 DIAGNOSIS — G629 Polyneuropathy, unspecified: Secondary | ICD-10-CM
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
## 2016-03-14 DIAGNOSIS — F419 Anxiety disorder, unspecified: Secondary | ICD-10-CM | POA: Diagnosis not present

## 2016-03-14 DIAGNOSIS — L03116 Cellulitis of left lower limb: Secondary | ICD-10-CM | POA: Diagnosis not present

## 2016-03-14 DIAGNOSIS — F17200 Nicotine dependence, unspecified, uncomplicated: Secondary | ICD-10-CM | POA: Diagnosis not present

## 2016-03-14 DIAGNOSIS — K219 Gastro-esophageal reflux disease without esophagitis: Secondary | ICD-10-CM | POA: Diagnosis not present

## 2016-03-19 ENCOUNTER — Ambulatory Visit (INDEPENDENT_AMBULATORY_CARE_PROVIDER_SITE_OTHER): Payer: Medicare PPO | Admitting: Sports Medicine

## 2016-03-19 ENCOUNTER — Encounter: Payer: Self-pay | Admitting: Sports Medicine

## 2016-03-19 DIAGNOSIS — L02619 Cutaneous abscess of unspecified foot: Secondary | ICD-10-CM

## 2016-03-19 DIAGNOSIS — Z09 Encounter for follow-up examination after completed treatment for conditions other than malignant neoplasm: Secondary | ICD-10-CM

## 2016-03-19 DIAGNOSIS — M79672 Pain in left foot: Secondary | ICD-10-CM | POA: Diagnosis not present

## 2016-03-19 DIAGNOSIS — L03119 Cellulitis of unspecified part of limb: Secondary | ICD-10-CM | POA: Diagnosis not present

## 2016-03-19 MED ORDER — OXYCODONE HCL 5 MG PO TABS: 5.0000 mg | ORAL_TABLET | ORAL | Status: DC | PRN
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
## 2016-04-02 ENCOUNTER — Encounter: Payer: Self-pay | Admitting: Sports Medicine

## 2016-04-02 ENCOUNTER — Ambulatory Visit (INDEPENDENT_AMBULATORY_CARE_PROVIDER_SITE_OTHER): Payer: Medicare PPO | Admitting: Sports Medicine

## 2016-04-02 DIAGNOSIS — L02619 Cutaneous abscess of unspecified foot: Secondary | ICD-10-CM

## 2016-04-02 DIAGNOSIS — Z09 Encounter for follow-up examination after completed treatment for conditions other than malignant neoplasm: Secondary | ICD-10-CM | POA: Diagnosis not present

## 2016-04-02 DIAGNOSIS — M79672 Pain in left foot: Secondary | ICD-10-CM

## 2016-04-02 DIAGNOSIS — L03119 Cellulitis of unspecified part of limb: Secondary | ICD-10-CM | POA: Diagnosis not present

## 2016-04-02 MED ORDER — OXYCODONE HCL 5 MG PO TABS: 5.0000 mg | ORAL_TABLET | ORAL | Status: DC | PRN
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
## 2016-04-03 DIAGNOSIS — F419 Anxiety disorder, unspecified: Secondary | ICD-10-CM

## 2016-04-03 DIAGNOSIS — L03116 Cellulitis of left lower limb: Secondary | ICD-10-CM | POA: Diagnosis not present

## 2016-04-03 DIAGNOSIS — E860 Dehydration: Secondary | ICD-10-CM

## 2016-04-03 DIAGNOSIS — L02612 Cutaneous abscess of left foot: Secondary | ICD-10-CM | POA: Diagnosis not present

## 2016-04-03 DIAGNOSIS — G629 Polyneuropathy, unspecified: Secondary | ICD-10-CM

## 2016-04-03 DIAGNOSIS — F17200 Nicotine dependence, unspecified, uncomplicated: Secondary | ICD-10-CM

## 2016-04-03 DIAGNOSIS — K219 Gastro-esophageal reflux disease without esophagitis: Secondary | ICD-10-CM

## 2016-04-04 ENCOUNTER — Telehealth: Payer: Self-pay | Admitting: Podiatry

## 2016-04-04 DIAGNOSIS — F419 Anxiety disorder, unspecified: Secondary | ICD-10-CM | POA: Diagnosis not present

## 2016-04-04 DIAGNOSIS — L02612 Cutaneous abscess of left foot: Secondary | ICD-10-CM | POA: Diagnosis not present

## 2016-04-04 DIAGNOSIS — L03116 Cellulitis of left lower limb: Secondary | ICD-10-CM | POA: Diagnosis not present

## 2016-04-04 DIAGNOSIS — E860 Dehydration: Secondary | ICD-10-CM | POA: Diagnosis not present

## 2016-04-04 NOTE — Telephone Encounter (Signed)
Patient called at 5:30pm 04/03/16 stating his foot was swollen, red, and warm. He states it looks like it did before with Dr. Marylene LandStover put her into the hospital. I recommended him to go the ER since it appears his symptoms have worsened. He later called me directly to inform me he should have been directly admitted. [facility]. When I spoke to him he did not know if he was going to [facility] or [facility]. I do not have [facility]. Informed Dr. Marylene LandStover the patient was in the ER.

## 2016-04-05 ENCOUNTER — Encounter: Payer: Self-pay | Admitting: Sports Medicine

## 2016-04-05 DIAGNOSIS — F419 Anxiety disorder, unspecified: Secondary | ICD-10-CM | POA: Diagnosis not present

## 2016-04-05 DIAGNOSIS — M86679 Other chronic osteomyelitis, unspecified ankle and foot: Secondary | ICD-10-CM

## 2016-04-05 DIAGNOSIS — L03116 Cellulitis of left lower limb: Secondary | ICD-10-CM | POA: Diagnosis not present

## 2016-04-05 DIAGNOSIS — L02612 Cutaneous abscess of left foot: Secondary | ICD-10-CM | POA: Diagnosis not present

## 2016-04-05 DIAGNOSIS — L03115 Cellulitis of right lower limb: Secondary | ICD-10-CM

## 2016-04-05 DIAGNOSIS — E860 Dehydration: Secondary | ICD-10-CM | POA: Diagnosis not present

## 2016-04-06 ENCOUNTER — Encounter: Payer: Self-pay | Admitting: Sports Medicine

## 2016-04-06 DIAGNOSIS — E860 Dehydration: Secondary | ICD-10-CM | POA: Diagnosis not present

## 2016-04-06 DIAGNOSIS — M86679 Other chronic osteomyelitis, unspecified ankle and foot: Secondary | ICD-10-CM

## 2016-04-06 DIAGNOSIS — L03116 Cellulitis of left lower limb: Secondary | ICD-10-CM | POA: Diagnosis not present

## 2016-04-06 DIAGNOSIS — L02612 Cutaneous abscess of left foot: Secondary | ICD-10-CM | POA: Diagnosis not present

## 2016-04-06 DIAGNOSIS — F419 Anxiety disorder, unspecified: Secondary | ICD-10-CM | POA: Diagnosis not present

## 2016-04-07 ENCOUNTER — Encounter: Payer: Self-pay | Admitting: Sports Medicine

## 2016-04-07 DIAGNOSIS — L02612 Cutaneous abscess of left foot: Secondary | ICD-10-CM | POA: Diagnosis not present

## 2016-04-07 DIAGNOSIS — F419 Anxiety disorder, unspecified: Secondary | ICD-10-CM | POA: Diagnosis not present

## 2016-04-07 DIAGNOSIS — L03116 Cellulitis of left lower limb: Secondary | ICD-10-CM | POA: Diagnosis not present

## 2016-04-07 DIAGNOSIS — E860 Dehydration: Secondary | ICD-10-CM | POA: Diagnosis not present

## 2016-04-08 DIAGNOSIS — E860 Dehydration: Secondary | ICD-10-CM

## 2016-04-08 DIAGNOSIS — F419 Anxiety disorder, unspecified: Secondary | ICD-10-CM | POA: Diagnosis not present

## 2016-04-08 DIAGNOSIS — F17210 Nicotine dependence, cigarettes, uncomplicated: Secondary | ICD-10-CM

## 2016-04-08 DIAGNOSIS — L02612 Cutaneous abscess of left foot: Secondary | ICD-10-CM | POA: Diagnosis not present

## 2016-04-08 DIAGNOSIS — L03116 Cellulitis of left lower limb: Secondary | ICD-10-CM

## 2016-04-08 DIAGNOSIS — G629 Polyneuropathy, unspecified: Secondary | ICD-10-CM

## 2016-04-08 DIAGNOSIS — K219 Gastro-esophageal reflux disease without esophagitis: Secondary | ICD-10-CM

## 2016-04-08 DIAGNOSIS — F17200 Nicotine dependence, unspecified, uncomplicated: Secondary | ICD-10-CM

## 2016-04-09 DIAGNOSIS — F419 Anxiety disorder, unspecified: Secondary | ICD-10-CM | POA: Diagnosis not present

## 2016-04-09 DIAGNOSIS — E860 Dehydration: Secondary | ICD-10-CM | POA: Diagnosis not present

## 2016-04-09 DIAGNOSIS — L03116 Cellulitis of left lower limb: Secondary | ICD-10-CM | POA: Diagnosis not present

## 2016-04-09 DIAGNOSIS — M86679 Other chronic osteomyelitis, unspecified ankle and foot: Secondary | ICD-10-CM

## 2016-04-09 DIAGNOSIS — L02612 Cutaneous abscess of left foot: Secondary | ICD-10-CM | POA: Diagnosis not present

## 2016-04-10 DIAGNOSIS — F419 Anxiety disorder, unspecified: Secondary | ICD-10-CM | POA: Diagnosis not present

## 2016-04-10 DIAGNOSIS — E860 Dehydration: Secondary | ICD-10-CM | POA: Diagnosis not present

## 2016-04-10 DIAGNOSIS — L03116 Cellulitis of left lower limb: Secondary | ICD-10-CM | POA: Diagnosis not present

## 2016-04-10 DIAGNOSIS — L02612 Cutaneous abscess of left foot: Secondary | ICD-10-CM | POA: Diagnosis not present

## 2016-04-11 DIAGNOSIS — E860 Dehydration: Secondary | ICD-10-CM | POA: Diagnosis not present

## 2016-04-11 DIAGNOSIS — L03116 Cellulitis of left lower limb: Secondary | ICD-10-CM | POA: Diagnosis not present

## 2016-04-11 DIAGNOSIS — L02612 Cutaneous abscess of left foot: Secondary | ICD-10-CM | POA: Diagnosis not present

## 2016-04-11 DIAGNOSIS — F419 Anxiety disorder, unspecified: Secondary | ICD-10-CM | POA: Diagnosis not present

## 2016-04-16 ENCOUNTER — Ambulatory Visit: Payer: Medicare PPO | Admitting: Sports Medicine

## 2016-04-30 ENCOUNTER — Ambulatory Visit (INDEPENDENT_AMBULATORY_CARE_PROVIDER_SITE_OTHER): Payer: Medicare PPO

## 2016-04-30 ENCOUNTER — Ambulatory Visit (INDEPENDENT_AMBULATORY_CARE_PROVIDER_SITE_OTHER): Payer: Medicare PPO | Admitting: Sports Medicine

## 2016-04-30 ENCOUNTER — Encounter: Payer: Self-pay | Admitting: Sports Medicine

## 2016-04-30 DIAGNOSIS — L02619 Cutaneous abscess of unspecified foot: Secondary | ICD-10-CM

## 2016-04-30 DIAGNOSIS — M79672 Pain in left foot: Secondary | ICD-10-CM

## 2016-04-30 DIAGNOSIS — L03119 Cellulitis of unspecified part of limb: Secondary | ICD-10-CM

## 2016-04-30 DIAGNOSIS — M79673 Pain in unspecified foot: Secondary | ICD-10-CM | POA: Diagnosis not present

## 2016-04-30 DIAGNOSIS — M86172 Other acute osteomyelitis, left ankle and foot: Secondary | ICD-10-CM

## 2016-04-30 DIAGNOSIS — Z9889 Other specified postprocedural states: Secondary | ICD-10-CM
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
## 2016-05-28 ENCOUNTER — Encounter: Payer: Medicare PPO | Admitting: Sports Medicine

## 2016-06-05 ENCOUNTER — Encounter: Payer: Medicare PPO | Admitting: Sports Medicine

## 2016-07-08 ENCOUNTER — Emergency Department (HOSPITAL_COMMUNITY)
Admission: EM | Admit: 2016-07-08 | Discharge: 2016-07-09 | Disposition: A | Discharge status: Other Acute Inpatient Hospital | Payer: Medicare PPO | Attending: Emergency Medicine | Admitting: Emergency Medicine

## 2016-07-08 ENCOUNTER — Encounter (HOSPITAL_COMMUNITY): Payer: Self-pay | Admitting: Emergency Medicine

## 2016-07-08 DIAGNOSIS — G8929 Other chronic pain: Secondary | ICD-10-CM | POA: Diagnosis not present

## 2016-07-08 DIAGNOSIS — F332 Major depressive disorder, recurrent severe without psychotic features: Secondary | ICD-10-CM

## 2016-07-08 DIAGNOSIS — Z880 Allergy status to penicillin: Secondary | ICD-10-CM | POA: Diagnosis not present

## 2016-07-08 DIAGNOSIS — F1110 Opioid abuse, uncomplicated: Secondary | ICD-10-CM | POA: Diagnosis not present

## 2016-07-08 DIAGNOSIS — G5792 Unspecified mononeuropathy of left lower limb: Secondary | ICD-10-CM | POA: Insufficient documentation

## 2016-07-08 DIAGNOSIS — Z8719 Personal history of other diseases of the digestive system: Secondary | ICD-10-CM | POA: Diagnosis not present

## 2016-07-08 DIAGNOSIS — K219 Gastro-esophageal reflux disease without esophagitis: Secondary | ICD-10-CM | POA: Insufficient documentation

## 2016-07-08 DIAGNOSIS — F1020 Alcohol dependence, uncomplicated: Secondary | ICD-10-CM | POA: Diagnosis not present

## 2016-07-08 DIAGNOSIS — Z79899 Other long term (current) drug therapy: Secondary | ICD-10-CM | POA: Diagnosis not present

## 2016-07-08 DIAGNOSIS — F1410 Cocaine abuse, uncomplicated: Secondary | ICD-10-CM | POA: Diagnosis not present

## 2016-07-08 DIAGNOSIS — F419 Anxiety disorder, unspecified: Secondary | ICD-10-CM | POA: Insufficient documentation

## 2016-07-08 DIAGNOSIS — F329 Major depressive disorder, single episode, unspecified: Secondary | ICD-10-CM | POA: Diagnosis not present

## 2016-07-08 DIAGNOSIS — Z87891 Personal history of nicotine dependence: Secondary | ICD-10-CM | POA: Diagnosis not present

## 2016-07-08 DIAGNOSIS — R45851 Suicidal ideations: Secondary | ICD-10-CM

## 2016-07-08 LAB — COMPREHENSIVE METABOLIC PANEL
ALT: 23 U/L (ref 17–63)
ANION GAP: 8 (ref 5–15)
AST: 16 U/L (ref 15–41)
Albumin: 4.3 g/dL (ref 3.5–5.0)
Alkaline Phosphatase: 78 U/L (ref 38–126)
BILIRUBIN TOTAL: 0.4 mg/dL (ref 0.3–1.2)
BUN: 20 mg/dL (ref 6–20)
CO2: 22 mmol/L (ref 22–32)
Calcium: 8.8 mg/dL — ABNORMAL LOW (ref 8.9–10.3)
Chloride: 107 mmol/L (ref 101–111)
Creatinine, Ser: 0.96 mg/dL (ref 0.61–1.24)
GFR calc Af Amer: >60 mL/min (ref >60)
Glucose, Bld: 101 mg/dL — ABNORMAL HIGH (ref 65–99)
POTASSIUM: 3.6 mmol/L (ref 3.5–5.1)
Sodium: 137 mmol/L (ref 135–145)
TOTAL PROTEIN: 7.9 g/dL (ref 6.5–8.1)

## 2016-07-08 LAB — CBC
HCT: 44.3 % (ref 39.0–52.0)
Hemoglobin: 15.7 g/dL (ref 13.0–17.0)
MCH: 30.1 pg (ref 26.0–34.0)
MCHC: 35.4 g/dL (ref 30.0–36.0)
MCV: 85 fL (ref 78.0–100.0)
PLATELETS: 293 10*3/uL (ref 150–400)
RBC: 5.21 MIL/uL (ref 4.22–5.81)
RDW: 14.1 % (ref 11.5–15.5)
WBC: 9.6 10*3/uL (ref 4.0–10.5)

## 2016-07-08 LAB — ACETAMINOPHEN LEVEL

## 2016-07-08 LAB — SALICYLATE LEVEL: Salicylate Lvl: <4 mg/dL (ref 2.8–30.0)

## 2016-07-08 LAB — CBG MONITORING, ED: Glucose-Capillary: 108 mg/dL — ABNORMAL HIGH (ref 65–99)

## 2016-07-08 LAB — ETHANOL

## 2016-07-08 MED ORDER — RIFAMPIN 300 MG PO CAPS
600.0000 mg | ORAL_CAPSULE | Freq: Every day | ORAL | Status: DC
  Filled 2016-07-08 (×2): qty 2

## 2016-07-08 MED ORDER — IBUPROFEN 200 MG PO TABS: 600.0000 mg | ORAL_TABLET | Freq: Three times a day (TID) | ORAL | Status: DC | PRN

## 2016-07-08 MED ORDER — GABAPENTIN 600 MG PO TABS: 600.0000 mg | ORAL_TABLET | Freq: Two times a day (BID) | ORAL | Status: DC

## 2016-07-08 MED ORDER — AMITRIPTYLINE HCL 25 MG PO TABS
50.0000 mg | ORAL_TABLET | Freq: Every day | ORAL | Status: DC
  Administered 2016-07-09: 50 mg via ORAL
  Filled 2016-07-08 (×2): qty 2

## 2016-07-08 MED ORDER — NICOTINE 21 MG/24HR TD PT24
21.0000 mg | MEDICATED_PATCH | Freq: Every day | TRANSDERMAL | Status: DC
  Administered 2016-07-09: 21 mg via TRANSDERMAL
  Filled 2016-07-08: qty 1

## 2016-07-08 MED ORDER — ACETAMINOPHEN 325 MG PO TABS: 650.0000 mg | ORAL_TABLET | ORAL | Status: DC | PRN

## 2016-07-08 MED ORDER — HYDROXYZINE HCL 25 MG PO TABS: 50.0000 mg | ORAL_TABLET | Freq: Every day | ORAL | Status: DC

## 2016-07-08 MED ORDER — DOXEPIN HCL 75 MG PO CAPS: 150.0000 mg | ORAL_CAPSULE | Freq: Every day | ORAL | Status: DC

## 2016-07-08 MED ORDER — BACLOFEN 10 MG PO TABS
10.0000 mg | ORAL_TABLET | Freq: Three times a day (TID) | ORAL | Status: DC
  Administered 2016-07-09 (×2): 10 mg via ORAL
  Filled 2016-07-08 (×2): qty 1

## 2016-07-08 MED ORDER — DOCUSATE SODIUM 100 MG PO CAPS: 100.0000 mg | ORAL_CAPSULE | Freq: Two times a day (BID) | ORAL | Status: DC

## 2016-07-08 MED ORDER — PREGABALIN 50 MG PO CAPS: 300.0000 mg | ORAL_CAPSULE | Freq: Two times a day (BID) | ORAL | Status: DC

## 2016-07-08 MED ORDER — PANTOPRAZOLE SODIUM 40 MG PO TBEC
40.0000 mg | DELAYED_RELEASE_TABLET | Freq: Every day | ORAL | Status: DC
  Administered 2016-07-09: 40 mg via ORAL
  Filled 2016-07-08: qty 1

## 2016-07-08 MED ORDER — ONDANSETRON HCL 4 MG PO TABS: 4.0000 mg | ORAL_TABLET | Freq: Three times a day (TID) | ORAL | Status: DC | PRN

## 2016-07-08 NOTE — ED Notes (Signed)
Pt placed in purple scrubs, wanded, and two patient belonging bags placed at the nursing station in triage and labeled.

## 2016-07-08 NOTE — ED Notes (Signed)
Pt placed on monitor.  

## 2016-07-08 NOTE — ED Notes (Signed)
Bed: [facility]4 Expected date:  Expected time:  Means of arrival:  Comments: 

## 2016-07-08 NOTE — ED Notes (Signed)
Pt does not respond to question of meds he took PTA.  Pt did deny wanting to harm self when questioned.

## 2016-07-08 NOTE — ED Triage Notes (Signed)
Pt states that he took 10 1mg  of lorazepam tonight and has been possibly using heroin. Took all of his disability check today and 'shot up.' Pt is drowsy and falling asleep while answering questions. States that he does want to hurt himself. Alert.
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
## 2016-07-09 ENCOUNTER — Inpatient Hospital Stay
Admit: 2016-07-09 | Discharge: 2016-07-15 | DRG: 885 | Disposition: A | Discharge status: Home / Self Care | Payer: Medicare PPO | Source: Other Acute Inpatient Hospital | Attending: Psychiatry | Admitting: Psychiatry

## 2016-07-09 ENCOUNTER — Other Ambulatory Visit: Payer: Self-pay | Admitting: Psychiatry

## 2016-07-09 DIAGNOSIS — F1420 Cocaine dependence, uncomplicated: Secondary | ICD-10-CM | POA: Diagnosis present

## 2016-07-09 DIAGNOSIS — Z880 Allergy status to penicillin: Secondary | ICD-10-CM

## 2016-07-09 DIAGNOSIS — M25519 Pain in unspecified shoulder: Secondary | ICD-10-CM | POA: Diagnosis present

## 2016-07-09 DIAGNOSIS — K219 Gastro-esophageal reflux disease without esophagitis: Secondary | ICD-10-CM | POA: Diagnosis present

## 2016-07-09 DIAGNOSIS — G629 Polyneuropathy, unspecified: Secondary | ICD-10-CM | POA: Diagnosis present

## 2016-07-09 DIAGNOSIS — M545 Low back pain: Secondary | ICD-10-CM | POA: Diagnosis present

## 2016-07-09 DIAGNOSIS — F332 Major depressive disorder, recurrent severe without psychotic features: Principal | ICD-10-CM | POA: Diagnosis present

## 2016-07-09 DIAGNOSIS — G4700 Insomnia, unspecified: Secondary | ICD-10-CM | POA: Diagnosis present

## 2016-07-09 DIAGNOSIS — M79605 Pain in left leg: Secondary | ICD-10-CM | POA: Diagnosis present

## 2016-07-09 DIAGNOSIS — Z87891 Personal history of nicotine dependence: Secondary | ICD-10-CM | POA: Diagnosis not present

## 2016-07-09 DIAGNOSIS — F17200 Nicotine dependence, unspecified, uncomplicated: Secondary | ICD-10-CM

## 2016-07-09 DIAGNOSIS — Z811 Family history of alcohol abuse and dependence: Secondary | ICD-10-CM | POA: Diagnosis not present

## 2016-07-09 DIAGNOSIS — R2681 Unsteadiness on feet: Secondary | ICD-10-CM

## 2016-07-09 DIAGNOSIS — F4310 Post-traumatic stress disorder, unspecified: Secondary | ICD-10-CM | POA: Diagnosis present

## 2016-07-09 DIAGNOSIS — F17210 Nicotine dependence, cigarettes, uncomplicated: Secondary | ICD-10-CM | POA: Diagnosis present

## 2016-07-09 DIAGNOSIS — F1020 Alcohol dependence, uncomplicated: Secondary | ICD-10-CM | POA: Diagnosis present

## 2016-07-09 DIAGNOSIS — Z79899 Other long term (current) drug therapy: Secondary | ICD-10-CM

## 2016-07-09 DIAGNOSIS — F1120 Opioid dependence, uncomplicated: Secondary | ICD-10-CM

## 2016-07-09 DIAGNOSIS — G5792 Unspecified mononeuropathy of left lower limb: Secondary | ICD-10-CM | POA: Diagnosis present

## 2016-07-09 DIAGNOSIS — M549 Dorsalgia, unspecified: Secondary | ICD-10-CM

## 2016-07-09 DIAGNOSIS — R45851 Suicidal ideations: Secondary | ICD-10-CM | POA: Diagnosis present

## 2016-07-09 DIAGNOSIS — F1220 Cannabis dependence, uncomplicated: Secondary | ICD-10-CM | POA: Diagnosis present

## 2016-07-09 DIAGNOSIS — F329 Major depressive disorder, single episode, unspecified: Secondary | ICD-10-CM | POA: Diagnosis present

## 2016-07-09 DIAGNOSIS — B1920 Unspecified viral hepatitis C without hepatic coma: Secondary | ICD-10-CM | POA: Diagnosis present

## 2016-07-09 DIAGNOSIS — G8929 Other chronic pain: Secondary | ICD-10-CM | POA: Diagnosis present

## 2016-07-09 DIAGNOSIS — F1410 Cocaine abuse, uncomplicated: Secondary | ICD-10-CM | POA: Diagnosis not present

## 2016-07-09 DIAGNOSIS — M25562 Pain in left knee: Secondary | ICD-10-CM

## 2016-07-09 HISTORY — DX: Other psychoactive substance abuse, uncomplicated: F19.10

## 2016-07-09 LAB — RAPID URINE DRUG SCREEN, HOSP PERFORMED
AMPHETAMINES: NOT DETECTED
BARBITURATES: NOT DETECTED
BENZODIAZEPINES: POSITIVE — AB
Cocaine: POSITIVE — AB
Opiates: POSITIVE — AB
TETRAHYDROCANNABINOL: NOT DETECTED

## 2016-07-09 MED ORDER — CLONIDINE HCL 0.1 MG PO TABS
0.1000 mg | ORAL_TABLET | Freq: Four times a day (QID) | ORAL | Status: DC
  Administered 2016-07-09 (×2): 0.1 mg via ORAL
  Filled 2016-07-09 (×2): qty 1

## 2016-07-09 MED ORDER — ALUM & MAG HYDROXIDE-SIMETH 200-200-20 MG/5ML PO SUSP: 30.0000 mL | ORAL | Status: DC | PRN

## 2016-07-09 MED ORDER — HYDROXYZINE HCL 25 MG PO TABS
25.0000 mg | ORAL_TABLET | Freq: Three times a day (TID) | ORAL | Status: DC | PRN
  Administered 2016-07-09: 25 mg via ORAL
  Filled 2016-07-09: qty 1

## 2016-07-09 MED ORDER — CLONIDINE HCL 0.1 MG PO TABS: 0.1000 mg | ORAL_TABLET | ORAL | Status: DC

## 2016-07-09 MED ORDER — HYDROXYZINE PAMOATE 25 MG PO CAPS
25.0000 mg | ORAL_CAPSULE | Freq: Three times a day (TID) | ORAL | Status: DC | PRN
  Filled 2016-07-09: qty 1

## 2016-07-09 MED ORDER — HYDROXYZINE HCL 25 MG PO TABS
25.0000 mg | ORAL_TABLET | Freq: Three times a day (TID) | ORAL | Status: DC | PRN
  Administered 2016-07-10 – 2016-07-14 (×14): 25 mg via ORAL
  Filled 2016-07-09 (×16): qty 1

## 2016-07-09 MED ORDER — TRAZODONE HCL 100 MG PO TABS
100.0000 mg | ORAL_TABLET | Freq: Every evening | ORAL | Status: DC | PRN
  Administered 2016-07-10: 100 mg via ORAL
  Filled 2016-07-09: qty 1

## 2016-07-09 MED ORDER — CLONIDINE HCL 0.1 MG PO TABS: 0.1000 mg | ORAL_TABLET | Freq: Every day | ORAL | Status: DC

## 2016-07-09 MED ORDER — ACETAMINOPHEN 325 MG PO TABS
650.0000 mg | ORAL_TABLET | Freq: Four times a day (QID) | ORAL | Status: DC | PRN
  Administered 2016-07-10 – 2016-07-11 (×4): 650 mg via ORAL
  Filled 2016-07-09 (×4): qty 2

## 2016-07-09 MED ORDER — METHOCARBAMOL 500 MG PO TABS: 500.0000 mg | ORAL_TABLET | Freq: Three times a day (TID) | ORAL | Status: DC | PRN

## 2016-07-09 MED ORDER — PREGABALIN 50 MG PO CAPS
150.0000 mg | ORAL_CAPSULE | Freq: Two times a day (BID) | ORAL | Status: DC
  Administered 2016-07-09: 150 mg via ORAL
  Filled 2016-07-09 (×2): qty 3

## 2016-07-09 MED ORDER — ONDANSETRON 4 MG PO TBDP
4.0000 mg | ORAL_TABLET | Freq: Four times a day (QID) | ORAL | Status: DC | PRN
Start: 2016-07-09 — End: 2016-07-09
  Filled 2016-07-09: qty 1

## 2016-07-09 MED ORDER — MAGNESIUM HYDROXIDE 400 MG/5ML PO SUSP: 30.0000 mL | Freq: Every day | ORAL | Status: DC | PRN

## 2016-07-09 MED ORDER — DICYCLOMINE HCL 20 MG PO TABS: 20.0000 mg | ORAL_TABLET | Freq: Four times a day (QID) | ORAL | Status: DC | PRN

## 2016-07-09 MED ORDER — LOPERAMIDE HCL 2 MG PO CAPS
2.0000 mg | ORAL_CAPSULE | ORAL | Status: DC | PRN
Start: 2016-07-09 — End: 2016-07-09

## 2016-07-09 NOTE — ED Notes (Signed)
RN attempted to ambulate patient, patient very unsteady with gait and assisted back to bed.  Patient reports he normally uses a walker for gait assistance.

## 2016-07-09 NOTE — BH Assessment (Addendum)
Patient has been accepted to [facility].  Accepting physician is Dr. Jennet MaduroPucilowska.  Attending Physician will be Dr. Ardyth HarpsHernandez.  Admission orders will be put in by Dr. Toni Amendlapacs Patient has been assigned to room 323, by [facility] Charge Nurse Victorino DikeJennifer.  Call report to [phone].  Representative/Transfer Coordinator is Nicola Girtalvin  [facility] ER Staff Jessie Foot([place], TTS) made aware of acceptance. [facility] Staff Jessie Foot([place], TTS) made aware of acceptance.   [facility]30[facility]
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
## 2016-07-09 NOTE — BH Assessment (Signed)
EDP attempted to wake patient. Patient unable to stay awake not alert to participate in the assessment.   Davina PokeJoVea Rankin Coolman, LCSW Therapeutic Triage Specialist  Health 07/09/2016 1:47 AM

## 2016-07-09 NOTE — ED Provider Notes (Signed)
Contacted by behavioral health services patient needs medical clearance so that they can disposition the patient. Patient presented with polysubstance abuse problems as well as suicidal ideation.  Patient's labs reviewed. Patient medically clear for disposition by behavioral health.  Results for orders placed or performed during the hospital encounter of 07/08/16  Comprehensive metabolic panel  Result Value Ref Range   Sodium 137 135 - 145 mmol/L   Potassium 3.6 3.5 - 5.1 mmol/L   Chloride 107 101 - 111 mmol/L   CO2 22 22 - 32 mmol/L   Glucose, Bld 101 (H) 65 - 99 mg/dL   BUN 20 6 - 20 mg/dL   Creatinine, Ser 1.610.96 0.61 - 1.24 mg/dL   Calcium 8.8 (L) 8.9 - 10.3 mg/dL   Total Protein 7.9 6.5 - 8.1 g/dL   Albumin 4.3 3.5 - 5.0 g/dL   AST 16 15 - 41 U/L   ALT 23 17 - 63 U/L   Alkaline Phosphatase 78 38 - 126 U/L   Total Bilirubin 0.4 0.3 - 1.2 mg/dL   GFR calc non Af Amer >60 >60 mL/min   GFR calc Af Amer >60 >60 mL/min   Anion gap 8 5 - 15  Ethanol  Result Value Ref Range   Alcohol, Ethyl (B) <5 <5 mg/dL  Salicylate level  Result Value Ref Range   Salicylate Lvl <4.0 2.8 - 30.0 mg/dL  Acetaminophen level  Result Value Ref Range   Acetaminophen (Tylenol), Serum <10 (L) 10 - 30 ug/mL  cbc  Result Value Ref Range   WBC 9.6 4.0 - 10.5 K/uL   RBC 5.21 4.22 - 5.81 MIL/uL   Hemoglobin 15.7 13.0 - 17.0 g/dL   HCT 09.644.3 04.539.0 - 40.952.0 %   MCV 85.0 78.0 - 100.0 fL   MCH 30.1 26.0 - 34.0 pg   MCHC 35.4 30.0 - 36.0 g/dL   RDW 81.114.1 91.411.5 - 78.215.5 %   Platelets 293 150 - 400 K/uL  Rapid urine drug screen (hospital performed)  Result Value Ref Range   Opiates POSITIVE (A) NONE DETECTED   Cocaine POSITIVE (A) NONE DETECTED   Benzodiazepines POSITIVE (A) NONE DETECTED   Amphetamines NONE DETECTED NONE DETECTED   Tetrahydrocannabinol NONE DETECTED NONE DETECTED   Barbiturates NONE DETECTED NONE DETECTED  CBG monitoring, ED  Result Value Ref Range   Glucose-Capillary 108 (H) 65 - 99 mg/dL    No results found.    Vanetta MuldersScott Klaira Pesci, MD 07/09/16 1739
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
## 2016-07-09 NOTE — BH Assessment (Signed)
Received consult request. Attempted to assess patient. He does not respond to his name being called.  Nurse attempted to wake up patient and patient wakes up and swings his arms and goes back to sleep.  Patient unable to be assessed at this time.    Davina PokeJoVea Keithen Capo, LCSW Therapeutic Triage Specialist [facility] 07/09/2016 12:08 AM

## 2016-07-09 NOTE — ED Notes (Signed)
TTS at bedside. 

## 2016-07-09 NOTE — BH Assessment (Signed)
Spoke with patients nurse, patient is still asleep. Requested TTS consult be removed and put back in when patient is able to participate in assessment. Patients nurse in agreement.   Davina PokeJoVea Falen Lehrmann, LCSW Therapeutic Triage Specialist [facility] 07/09/2016 4:13 AM

## 2016-07-09 NOTE — BH Assessment (Signed)
BHH Assessment Progress Note  Per Thedore MinsMojeed Akintayo, MD, this pt requires psychiatric hospitalization at this time.  Pt has been referred to [facility].  As of this writing a final decision is pending.  When it is reached, please call Denny Peonrin, LCSW at [phone].  Doylene Canninghomas Keland Peyton, MA Triage Specialist [phone]

## 2016-07-09 NOTE — ED Notes (Signed)
Bed: [id number] Expected date:  Expected time:  Means of arrival:  Comments: Hold for 3

## 2016-07-09 NOTE — ED Notes (Signed)
TTS consult discontinued at this time. Will reorder when pt is more aroused.

## 2016-07-09 NOTE — ED Notes (Signed)
Delay on urine sample pt is having a hard time staying awake long enough to provide one

## 2016-07-09 NOTE — ED Notes (Signed)
Call received from poison control. Informed about pt current status and plan.

## 2016-07-09 NOTE — ED Notes (Signed)
Asked Dr Clarene DukeLittle [facility].  Was told that he has PRN orders already.

## 2016-07-09 NOTE — ED Notes (Addendum)
Pt is asleep on his back with regular respirations. He appears comfortable. [facility] 8:30pm tonight. [facility] the patient will be transported at 9pm this pm. Franky MachoLuke made aware pts clonidine will be held due to transport and upon arrival he will need the medicaiton if not contraindicated. Pt given his elavil 50mg  and his baclofen10mg  . Notified nurse Franky MachoLuke that the pt did not get his clonidine

## 2016-07-10 DIAGNOSIS — F332 Major depressive disorder, recurrent severe without psychotic features: Principal | ICD-10-CM

## 2016-07-10 DIAGNOSIS — F1020 Alcohol dependence, uncomplicated: Secondary | ICD-10-CM

## 2016-07-10 DIAGNOSIS — F1120 Opioid dependence, uncomplicated: Secondary | ICD-10-CM

## 2016-07-10 DIAGNOSIS — F1420 Cocaine dependence, uncomplicated: Secondary | ICD-10-CM

## 2016-07-10 DIAGNOSIS — F17200 Nicotine dependence, unspecified, uncomplicated: Secondary | ICD-10-CM

## 2016-07-10 DIAGNOSIS — F1220 Cannabis dependence, uncomplicated: Secondary | ICD-10-CM

## 2016-07-10 LAB — LIPID PANEL
CHOL/HDL RATIO: 5 ratio
Cholesterol: 145 mg/dL (ref 0–200)
HDL: 29 mg/dL — AB (ref >40)
LDL CALC: 87 mg/dL (ref 0–99)
Triglycerides: 147 mg/dL (ref <150)
VLDL: 29 mg/dL (ref 0–40)

## 2016-07-10 LAB — TSH: TSH: 2.678 u[IU]/mL (ref 0.350–4.500)

## 2016-07-10 MED ORDER — NICOTINE 21 MG/24HR TD PT24
21.0000 mg | MEDICATED_PATCH | Freq: Every day | TRANSDERMAL | Status: DC
  Administered 2016-07-10: 21 mg via TRANSDERMAL
  Filled 2016-07-10: qty 1

## 2016-07-10 MED ORDER — BACLOFEN 10 MG PO TABS
5.0000 mg | ORAL_TABLET | Freq: Three times a day (TID) | ORAL | Status: DC
  Administered 2016-07-10 – 2016-07-15 (×15): 5 mg via ORAL
  Filled 2016-07-10 (×3): qty 1
  Filled 2016-07-10: qty 0.5
  Filled 2016-07-10: qty 1
  Filled 2016-07-10: qty 0.5
  Filled 2016-07-10 (×9): qty 1
  Filled 2016-07-10: qty 0.5

## 2016-07-10 MED ORDER — PANTOPRAZOLE SODIUM 40 MG PO TBEC
40.0000 mg | DELAYED_RELEASE_TABLET | Freq: Every day | ORAL | Status: DC
  Administered 2016-07-10 – 2016-07-11 (×2): 40 mg via ORAL
  Filled 2016-07-10 (×2): qty 1

## 2016-07-10 MED ORDER — DOXEPIN HCL 50 MG PO CAPS: 50.0000 mg | ORAL_CAPSULE | Freq: Every day | ORAL | Status: DC

## 2016-07-10 MED ORDER — PREGABALIN 75 MG PO CAPS
150.0000 mg | ORAL_CAPSULE | Freq: Two times a day (BID) | ORAL | Status: DC
  Administered 2016-07-10 – 2016-07-15 (×10): 150 mg via ORAL
  Filled 2016-07-10 (×10): qty 2

## 2016-07-10 MED ORDER — TRAZODONE HCL 50 MG PO TABS
150.0000 mg | ORAL_TABLET | Freq: Every day | ORAL | Status: DC
  Administered 2016-07-10: 150 mg via ORAL
  Filled 2016-07-10: qty 1

## 2016-07-10 MED ORDER — TRAZODONE HCL 100 MG PO TABS: 100.0000 mg | ORAL_TABLET | Freq: Every day | ORAL | Status: DC

## 2016-07-10 NOTE — BHH Suicide Risk Assessment (Signed)
[facility]   Nursing information obtained from:    Demographic factors:    Current Mental Status:    Loss Factors:    Historical Factors:    Risk Reduction Factors:     Total Time spent with patient: 1 hour Principal Problem: MDD (major depressive disorder), recurrent episode, severe (HCC) Diagnosis:   Patient Active Problem List   Diagnosis Date Noted  . Cannabis use disorder, severe, dependence (HCC) [F12.20] 07/10/2016  . Cocaine use disorder, severe, dependence (HCC) [F14.20] 07/10/2016  . Opioid use disorder, severe, dependence (HCC) [F11.20] 07/10/2016  . Alcohol use disorder, severe, dependence (HCC) [F10.20] 07/10/2016  . Tobacco use disorder [F17.200] 07/10/2016  . MDD (major depressive disorder), recurrent episode, severe (HCC) [F33.2] 07/09/2016  . Chronic back pain [M54.9, G89.29] 03/01/2013  . 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 GERD (gastroesophageal reflux disease) [K21.9] 03/01/2013   Subjective Data:   Continued Clinical Symptoms:  Alcohol Use Disorder Identification Test Final Score (AUDIT): 0 The "Alcohol Use Disorders Identification Test", Guidelines for Use in Primary Care, Second Edition.  World Science writerHealth Organization [facility](WHO). Score between 0-7:  no or low risk or alcohol related problems. Score between 8-15:  moderate risk of alcohol related problems. Score between 16-19:  high risk of alcohol related problems. Score 20 or above:  warrants further diagnostic evaluation for alcohol dependence and treatment.   CLINICAL FACTORS:   Depression:   Comorbid alcohol abuse/dependence Impulsivity Severe Alcohol/Substance Abuse/Dependencies Chronic Pain More than one psychiatric diagnosis Previous Psychiatric Diagnoses and Treatments Medical Diagnoses and Treatments/Surgeries   Musculoskeletal:   Psychiatric Specialty Exam: Physical Exam  ROS  Blood pressure 117/74, pulse 94, temperature 97.7 F (36.5 C),  temperature source Oral, resp. rate 20, height 5\' 9"  (1.753 m), weight 79.8 kg (176 lb), SpO2 100 %.Body mass index is 25.99 kg/m.                                                    Sleep:  Number of Hours: 5.5      COGNITIVE FEATURES THAT CONTRIBUTE TO RISK:  None    SUICIDE RISK:   Moderate:  Frequent suicidal ideation with limited intensity, and duration, some specificity in terms of plans, no associated intent, good self-control, limited dysphoria/symptomatology, some risk factors present, and identifiable protective factors, including available and accessible social support.   [facility]  I certify that inpatient services furnished can reasonably be expected to improve the patient's condition.  Jimmy FootmanHernandez-Gonzalez,  Marlicia Sroka, MD 07/10/2016, 12:15 PM

## 2016-07-10 NOTE — Progress Notes (Signed)
NUTRITION ASSESSMENT  Pt identified as at risk on the Malnutrition Screen Tool  INTERVENTION: 1. Monitor intake and cater to pt preferences 2. If unable to meet nutritional needs recommend Ensure Enlive po BID, each supplement provides 350 kcal and 20 grams of protein    NUTRITION DIAGNOSIS: Unintentional weight loss related to sub-optimal intake as evidenced by pt report.   Goal: Pt to meet >/= 90% of their estimated nutrition needs.  Monitor:  PO intake  Assessment:    40 y.o. male admitted with depression and substance abuse.   Height: Ht Readings from Last 1 Encounters:  07/09/16 5\' 9"  (1.753 m)    Weight: reviewed, wt loss not significant in time frame below Wt Readings from Last 1 Encounters:  07/09/16 176 lb (79.8 kg)    Weight Hx: Wt Readings from Last 10 Encounters:  07/09/16 176 lb (79.8 kg)  01/13/14 190 lb (86.2 kg)  01/12/14 199 lb (90.3 kg)  11/30/13 190 lb (86.2 kg)  11/18/13 190 lb (86.2 kg)  09/05/13 173 lb (78.5 kg)  07/21/13 198 lb 2 oz (89.9 kg)  06/20/13 199 lb 3.2 oz (90.4 kg)  03/01/13 198 lb 10.2 oz (90.1 kg)    BMI:  Body mass index is 25.99 kg/m.   Estimated Nutritional Needs: Kcal: 2000 kcal/kg Protein: > 80 gram protein/kg Fluid: 2000 ml/kcal  Diet Order: Diet regular Room service appropriate? Yes; Fluid consistency: Thin Pt is also offered choice of unit snacks mid-morning and mid-afternoon. Pt eating 60-100% of meals  Pt is eating as desired.   Lab results and medications reviewed.   Arrietty Dercole B. Freida BusmanAllen, RD, LDN [phone] (pager) Weekend/On-Call pager [phone])

## 2016-07-10 NOTE — BHH Group Notes (Signed)
BHH Group Notes:  (Nursing/MHT/Case Management/Adjunct)  Date:  07/10/2016  Time:  10:19 PM  Type of Therapy:  Psychoeducational Skills  Participation Level:  Active  Participation Quality:  Appropriate, Attentive and Sharing  Affect:  Appropriate  Cognitive:  Appropriate  Insight:  Appropriate and Good  Engagement in Group:  Engaged  Modes of Intervention:  Discussion, Socialization and Support  Summary of Progress/Problems:  [name] MilroyLaquanda Y Saphyre Pierce 07/10/2016, 10:19 PM

## 2016-07-10 NOTE — BHH Group Notes (Signed)
[facility] LCSW Group Therapy   07/10/2016  9:30am  Type of Therapy: Group Therapy   Participation Level: Did Not Attend. Patient invited to participate but declined.    Wolfgang Finigan F. Mylisa Brunson, MSW, LCSWA, LCAS     

## 2016-07-10 NOTE — BHH Group Notes (Signed)
Goals Group  Date/Time: 07/10/2016 9am  Type of Therapy and Topic: Group Therapy: Goals Group: SMART Goals   Pt was called, but did not attend   Tayli Buch F. Deliana Avalos, LCSWA, LCAS  

## 2016-07-10 NOTE — Plan of Care (Signed)
Problem: Safety: Goal: Ability to remain free from injury will improve Outcome: Progressing Pt has remained free from injury   

## 2016-07-10 NOTE — Progress Notes (Signed)
D: No groups  This shift . Patient remained in bed.  Present  No Patient stated slept good last night .Stated appetite is good and energy level  Is normal. Stated concentration is good . Stated on Depression scale 9 , hopeless 9 and anxiety 9 .( low 0-10 high) Denies suicidal  homicidal ideations  .  No auditory hallucinations patient has pain  concerns . Received  Medication that is available. Counting  Down times when  He can have medications  Again  No  ADL'S. Limited  Interacting with peers and staff. A: Encourage patient participation with unit programming . Instruction  Given on  Medication , verbalize understanding. R: Voice no other concerns. Staff continue to monitor

## 2016-07-10 NOTE — Plan of Care (Signed)
Problem: Coping: Goal: Ability to verbalize frustrations and anger appropriately will improve Outcome: Progressing Working on coping skills , handout given   
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
## 2016-07-10 NOTE — [facility] Group Notes (Signed)
[facility] Group Notes:  (Nursing/MHT/Case Management/Adjunct)  Date:  07/10/2016  Time:  3:48 PM  Type of Therapy:  Psychoeducational Skills  Participation Level:  Did Not Attend  Zenaida NieceKristen J Sylwia Cuervo 07/10/2016, 3:48 PM

## 2016-07-10 NOTE — Tx Team (Signed)
Initial Treatment Plan 07/10/2016 5:50 AM [name] GNF:[id number]    PATIENT STRESSORS: Substance abuse   PATIENT STRENGTHS: Barrister's clerkCommunication skills Motivation for treatment/growth   PATIENT IDENTIFIED PROBLEMS: Substance abuse  Suicidal Ideation  "I want to change but I need help"  "Long-term treatment facility"               DISCHARGE CRITERIA:  Improved stabilization in mood, thinking, and/or behavior Motivation to continue treatment in a less acute level of care  PRELIMINARY DISCHARGE PLAN: Attend 12-step recovery group Outpatient therapy  PATIENT/FAMILY INVOLVEMENT: This treatment plan has been presented to and reviewed with the patient, [name].  The patient and family have been given the opportunity to ask questions and make suggestions.  Rockie NeighboursLuke B Kristy Schomburg, RN 07/10/2016, 5:50 AM
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
## 2016-07-11 LAB — HEMOGLOBIN A1C
HEMOGLOBIN A1C: 5.9 % — AB (ref 4.8–5.6)
Mean Plasma Glucose: 123 mg/dL

## 2016-07-11 MED ORDER — NICOTINE 14 MG/24HR TD PT24
14.0000 mg | MEDICATED_PATCH | Freq: Every day | TRANSDERMAL | Status: DC
  Administered 2016-07-11 – 2016-07-14 (×4): 14 mg via TRANSDERMAL
  Filled 2016-07-11 (×4): qty 1

## 2016-07-11 MED ORDER — DICLOFENAC SODIUM 1 % TD GEL
4.0000 g | Freq: Four times a day (QID) | TRANSDERMAL | Status: DC | PRN
  Administered 2016-07-11 – 2016-07-13 (×5): 4 g via TOPICAL
  Filled 2016-07-11: qty 100

## 2016-07-11 MED ORDER — TRAZODONE HCL 100 MG PO TABS
200.0000 mg | ORAL_TABLET | Freq: Every day | ORAL | Status: DC
  Administered 2016-07-11 – 2016-07-14 (×4): 200 mg via ORAL
  Filled 2016-07-11 (×4): qty 2

## 2016-07-11 MED ORDER — PANTOPRAZOLE SODIUM 40 MG PO TBEC
40.0000 mg | DELAYED_RELEASE_TABLET | Freq: Two times a day (BID) | ORAL | Status: DC
  Administered 2016-07-11 – 2016-07-15 (×8): 40 mg via ORAL
  Filled 2016-07-11 (×10): qty 1

## 2016-07-11 MED ORDER — TUBERCULIN PPD 5 UNIT/0.1ML ID SOLN
5.0000 [IU] | Freq: Once | INTRADERMAL | Status: AC
  Administered 2016-07-11: 5 [IU] via INTRADERMAL
  Filled 2016-07-11: qty 0.1

## 2016-07-11 MED ORDER — IBUPROFEN 600 MG PO TABS
600.0000 mg | ORAL_TABLET | Freq: Four times a day (QID) | ORAL | Status: DC | PRN
Start: 2016-07-11 — End: 2016-07-15
  Administered 2016-07-11 – 2016-07-15 (×11): 600 mg via ORAL
  Filled 2016-07-11 (×10): qty 1

## 2016-07-11 MED ORDER — ACETAMINOPHEN 500 MG PO TABS
1000.0000 mg | ORAL_TABLET | Freq: Four times a day (QID) | ORAL | Status: DC | PRN
  Administered 2016-07-11 – 2016-07-15 (×8): 1000 mg via ORAL
  Filled 2016-07-11 (×8): qty 2

## 2016-07-11 MED ORDER — AMITRIPTYLINE HCL 50 MG PO TABS
50.0000 mg | ORAL_TABLET | Freq: Every day | ORAL | Status: DC
  Administered 2016-07-11 – 2016-07-12 (×2): 50 mg via ORAL
  Filled 2016-07-11 (×2): qty 1

## 2016-07-11 MED ORDER — NICOTINE 14 MG/24HR TD PT24
14.0000 mg | MEDICATED_PATCH | Freq: Every day | TRANSDERMAL | Status: DC
Start: 2016-07-12 — End: 2016-07-11
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
## 2016-07-11 NOTE — [facility] Group Notes (Signed)
[facility] LCSW Group Therapy   07/11/2016 9:30 AM   Type of Therapy: Group Therapy   Participation Level: Active   Participation Quality: Attentive, Sharing and Supportive   Affect: Appropriate   Cognitive: Alert and Oriented   Insight: Developing/Improving and Engaged   Engagement in Therapy: Developing/Improving and Engaged   Modes of Intervention: Clarification, Confrontation, Discussion, Education, Exploration, Limit-setting, Orientation, Problem-solving, Rapport Building, Dance movement psychotherapisteality Testing, Socialization and Support   Summary of Progress/Problems: The topic for today was feelings about relapse. Pt discussed what relapse prevention is to them and identified triggers that they are on the path to relapse. Pt processed their feeling towards relapse and was able to relate to peers. Pt discussed coping skills that can be used for relapse prevention. Pt identified that the pt considers relapse to be the pt's returning to the use of alcohol and substances.  Pt identified a barrier to the prevention of relapse for the pt is the pt not being able to receive suboxone while in treatment for the use of substances.  Pt identified one positive aspect to to this is that the pt is having time to think about the core issue causing the pt to use alcohol and/or substances.  Pt was polite and cooperative with the CSW and other group members and focused and attentive to the topics discussed and the sharing of others.    Dorothe PeaJonathan F. Toribio Seiber, MSW, LCSWA, LCAS
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
## 2016-07-11 NOTE — Progress Notes (Signed)
TB skin test administered to R forearm. To be read 07/13/2016 at 11:48am.

## 2016-07-11 NOTE — [facility] Group Notes (Signed)
[facility] Group Notes:  (Nursing/MHT/Case Management/Adjunct)  Date:  07/11/2016  Time:  5:58 PM  Type of Therapy:  Psychoeducational Skills  Participation Level:  Minimal  Participation Quality:  Inattentive  Affect:  Flat  Cognitive:  Oriented  Insight:  Lacking  Engagement in Group:  Lacking  Modes of Intervention:  Discussion and Education  Summary of Progress/Problems:  Mickey Farberamela M Moses Ellison 07/11/2016, 5:58 PM

## 2016-07-11 NOTE — Progress Notes (Signed)
Patient ID: [name], male   DOB: 09/15/1976, 40 y.o.   MRN: [id number]  CSW inquired to several inpatient drug treatment facilities on patient's behalf. Those include [facility], [facility], and [facility]. [facility] has no bed availability currently and has a significant waitlist. [facility] has no beds and are currently no accepting referrals for wait list. CSW will fax referral to [facility] and [facility] on patient's behalf.  [name] [name] MSW, LCSWA 07/11/2016 4:45 PM

## 2016-07-11 NOTE — Progress Notes (Signed)
Awake and alert, up on unit. Appropriately interacts with peers and staff. Continuously reports pain 8/10 to left leg/knee. Pleasant, bright on interaction. Reports anxiety. Attends group. Denies SI/HI/AVH. Reports poor sleep with sleep medication not being helpful last night. Reported sleep 5 hours 15 minutes. Reports fair appetite, low energy and poor concentration. Rates depression 8/10, anxiety 8/10, and hopelessness 6/10 (low 0-10 high). Reports today's goal is to "stay positive." Medication complaint.   Support and encouragement provided. Medications administered as ordered to include PRNs. Safety maintained with every 15 minute checks. Will continue to monitor.

## 2016-07-11 NOTE — Tx Team (Signed)
Interdisciplinary Treatment and Diagnostic Plan Update  07/11/2016 Time of Session: 10:50am [name] MRN: [id number]  Principal Diagnosis: MDD (major depressive disorder), recurrent episode, severe (HCC)  Secondary Diagnoses: Principal Problem:   MDD (major depressive disorder), recurrent episode, severe (HCC) Active Problems:   Chronic back pain   Neuropathy of left lower extremity   GERD (gastroesophageal reflux disease)   Cannabis use disorder, severe, dependence (HCC)   Cocaine use disorder, severe, dependence (HCC)   Opioid use disorder, severe, dependence (HCC)   Alcohol use disorder, severe, dependence (HCC)   Tobacco use disorder   Current Medications:  Current Facility-Administered Medications  Medication Dose Route Frequency Provider Last Rate Last Dose  . acetaminophen (TYLENOL) tablet 650 mg  650 mg Oral Q6H PRN Audery Amel, MD   650 mg at 07/11/16 0841  . alum & mag hydroxide-simeth (MAALOX/MYLANTA) 200-200-20 MG/5ML suspension 30 mL  30 mL Oral Q4H PRN Audery Amel, MD      . baclofen (LIORESAL) tablet 5 mg  5 mg Oral TID Jimmy Footman, MD   5 mg at 07/11/16 0841  . hydrOXYzine (ATARAX/VISTARIL) tablet 25 mg  25 mg Oral TID PRN Audery Amel, MD   25 mg at 07/10/16 2107  . magnesium hydroxide (MILK OF MAGNESIA) suspension 30 mL  30 mL Oral Daily PRN Audery Amel, MD      . nicotine (NICODERM CQ - dosed in mg/24 hours) patch 14 mg  14 mg Transdermal Daily Jimmy Footman, MD      . pantoprazole (PROTONIX) EC tablet 40 mg  40 mg Oral Daily Jimmy Footman, MD   40 mg at 07/11/16 0841  . pregabalin (LYRICA) capsule 150 mg  150 mg Oral BID Jimmy Footman, MD   150 mg at 07/11/16 0841  . traZODone (DESYREL) tablet 200 mg  200 mg Oral QHS Jimmy Footman, MD       PTA Medications: Prescriptions Prior to Admission  Medication Sig Dispense Refill Last Dose  . amitriptyline (ELAVIL) 50 MG tablet Take 1 tablet (50  mg total) by mouth at bedtime. For depression/insomnia 30 tablet 0 07/09/2016 at Unknown time  . doxepin (SINEQUAN) 150 MG capsule Take 1 capsule (150 mg total) by mouth at bedtime. For   07/09/2016 at Unknown time  . esomeprazole (NEXIUM) 40 MG capsule Take 1 capsule (40 mg total) by mouth daily. For acid reflux   07/09/2016 at Unknown time  . LYRICA 150 MG capsule Take 150 mg by mouth 2 (two) times daily.  4 07/09/2016 at Unknown time  . [DISCONTINUED] hydrOXYzine (VISTARIL) 25 MG capsule TAKE ONE CAPSULE BY MOUTH 3 TIMES A DAY AS NEEDED FOR ANXIETY  3 07/10/2016 at Unknown time  . [DISCONTINUED] oxyCODONE (OXY IR/ROXICODONE) 5 MG immediate release tablet Take 1 tablet (5 mg total) by mouth every 4 (four) hours as needed for breakthrough pain (take between mortin for breakthrough pain as needed). 30 tablet 0 Past Week at Unknown time  . [DISCONTINUED] oxyCODONE-acetaminophen (PERCOCET) 10-325 MG tablet Take 1 tablet by mouth every 6 (six) hours as needed for pain.   Past Week at Unknown time    Patient Stressors: Substance abuse  Patient Strengths: Barrister's clerk for treatment/growth  Treatment Modalities: Medication Management, Group therapy, Case management,  1 to 1 session with clinician, Psychoeducation, Recreational therapy.   Physician Treatment Plan for Primary Diagnosis: MDD (major depressive disorder), recurrent episode, severe (HCC) Long Term Goal(s): Improvement in symptoms so as ready for discharge Improvement  in symptoms so as ready for discharge   Short Term Goals: Ability to identify changes in lifestyle to reduce recurrence of condition will improve Ability to verbalize feelings will improve Ability to disclose and discuss suicidal ideas Ability to demonstrate self-control will improve Ability to identify and develop effective coping behaviors will improve Compliance with prescribed medications will improve Ability to identify triggers associated with  substance abuse/mental health issues will improve Ability to identify changes in lifestyle to reduce recurrence of condition will improve Ability to demonstrate self-control will improve Ability to identify and develop effective coping behaviors will improve Ability to identify triggers associated with substance abuse/mental health issues will improve  Medication Management: Evaluate patient's response, side effects, and tolerance of medication regimen.  Therapeutic Interventions: 1 to 1 sessions, Unit Group sessions and Medication administration.  Evaluation of Outcomes: Progressing  Physician Treatment Plan for Secondary Diagnosis: Principal Problem:   MDD (major depressive disorder), recurrent episode, severe (HCC) Active Problems:   Chronic back pain   Neuropathy of left lower extremity   GERD (gastroesophageal reflux disease)   Cannabis use disorder, severe, dependence (HCC)   Cocaine use disorder, severe, dependence (HCC)   Opioid use disorder, severe, dependence (HCC)   Alcohol use disorder, severe, dependence (HCC)   Tobacco use disorder  Long Term Goal(s): Improvement in symptoms so as ready for discharge Improvement in symptoms so as ready for discharge   Short Term Goals: Ability to identify changes in lifestyle to reduce recurrence of condition will improve Ability to verbalize feelings will improve Ability to disclose and discuss suicidal ideas Ability to demonstrate self-control will improve Ability to identify and develop effective coping behaviors will improve Compliance with prescribed medications will improve Ability to identify triggers associated with substance abuse/mental health issues will improve Ability to identify changes in lifestyle to reduce recurrence of condition will improve Ability to demonstrate self-control will improve Ability to identify and develop effective coping behaviors will improve Ability to identify triggers associated with substance  abuse/mental health issues will improve     Medication Management: Evaluate patient's response, side effects, and tolerance of medication regimen.  Therapeutic Interventions: 1 to 1 sessions, Unit Group sessions and Medication administration.  Evaluation of Outcomes: Progressing   RN Treatment Plan for Primary Diagnosis: MDD (major depressive disorder), recurrent episode, severe (HCC) Long Term Goal(s): Knowledge of disease and therapeutic regimen to maintain health will improve  Short Term Goals: Ability to remain free from injury will improve, Ability to verbalize feelings will improve, Ability to disclose and discuss suicidal ideas and Compliance with prescribed medications will improve  Medication Management: RN will administer medications as ordered by provider, will assess and evaluate patient's response and provide education to patient for prescribed medication. RN will report any adverse and/or side effects to prescribing provider.  Therapeutic Interventions: 1 on 1 counseling sessions, Psychoeducation, Medication administration, Evaluate responses to treatment, Monitor vital signs and CBGs as ordered, Perform/monitor CIWA, COWS, AIMS and Fall Risk screenings as ordered, Perform wound care treatments as ordered.  Evaluation of Outcomes: Progressing   LCSW Treatment Plan for Primary Diagnosis: MDD (major depressive disorder), recurrent episode, severe (HCC) Long Term Goal(s): Safe transition to appropriate next level of care at discharge, Engage patient in therapeutic group addressing interpersonal concerns.  Short Term Goals: Engage patient in aftercare planning with referrals and resources, Increase social support, Increase ability to appropriately verbalize feelings, Increase emotional regulation, Facilitate acceptance of mental health diagnosis and concerns, Facilitate patient progression through stages of change  regarding substance use diagnoses and concerns, Identify triggers  associated with mental health/substance abuse issues and Increase skills for wellness and recovery  Therapeutic Interventions: Assess for all discharge needs, 1 to 1 time with Social worker, Explore available resources and support systems, Assess for adequacy in community support network, Educate family and significant other(s) on suicide prevention, Complete Psychosocial Assessment, Interpersonal group therapy.  Evaluation of Outcomes: Progressing   Progress in Treatment: Attending groups: No. Participating in groups: No. Taking medication as prescribed: Yes. Toleration medication: Yes. Family/Significant other contact made: No, will contact:  patients mother Patient understands diagnosis: Yes. Discussing patient identified problems/goals with staff: Yes. Medical problems stabilized or resolved: No. Denies suicidal/homicidal ideation: No. Issues/concerns per patient self-inventory: No. Other: n/a  New problem(s) identified: None identified at this time.  New Short Term/Long Term Goal(s): None identified at this time.   Discharge Plan or Barriers: Patient will be referred to inpatient drug treatment. Patient has no social support and is homeless.   Reason for Continuation of Hospitalization: Depression Medication stabilization Suicidal ideation  Estimated Length of Stay: 3 to 5 days  Attendees: Patient:[name] 07/11/2016 10:49 AM  Physician: Dr. Radene JourneyJayme Cloud, MD 07/11/2016 10:49 AM  Nursing: Leonia Reader, RN 07/11/2016 10:49 AM  RN Care Manager: 07/11/2016 10:49 AM  Social Worker: Fredrich Birks. Garnette Czech MSW, LCSWA 07/11/2016 10:49 AM  Recreational Therapist:  07/11/2016 10:49 AM  Other:  07/11/2016 10:49 AM  Other:  07/11/2016 10:49 AM  Other: 07/11/2016 10:49 AM    Scribe for Treatment Team: Arelia Longest, LCSWA 07/11/2016 11:26 AM

## 2016-07-11 NOTE — Plan of Care (Signed)
Problem: Education: Goal: Emotional status will improve Outcome: Progressing Pt rates depression and anxiety 8/10. He does deny SI at this time and contracts for safety.  Problem: Safety: Goal: Periods of time without injury will increase Outcome: Progressing Pt remains free from harm.

## 2016-07-11 NOTE — Progress Notes (Signed)
Pt was given his shoes on arrival to [facility]. He has them at bedside. The shoes are brown slip-on loafers without laces. Reports he did not realize that this particular pair of shoes was brought by his family. Shoes fit his feet securely. They do not slip on/off. Will continue to monitor.

## 2016-07-11 NOTE — Plan of Care (Signed)
Problem: Safety: Goal: Ability to remain free from injury will improve Outcome: Progressing Safety maintained with every 15 minute checks and fall precautions.

## 2016-07-11 NOTE — Progress Notes (Signed)
D: Pt affect is anxious and depressed this evening. He reports "I feel like everything is just squeezing in on me" referring to his anxiety. He requests PRN medication appropriately. Pt rates depression and anxiety 8/10. He denies SI/HI/AVH "not at this time." Pt reports pain. He discusses his accidents and surgeries with Clinical research associatewriter. Pt reports he is at the hospital "to get clean." A: Emotional support and encouragement provided. Medications administered with education. q15 minute safety checks maintained. R: Pt remains free from harm. Will continue to monitor.

## 2016-07-11 NOTE — [facility] Suicide Risk Assessment (Signed)
[facility] INPATIENT:  Family/Significant Other Suicide Prevention Education  Suicide Prevention Education:  Education Completed;Margie Mccorvey(mother [phone]), has been identified by the patient as the family member/significant other with whom the patient will be residing, and identified as the person(s) who will aid the patient in the event of a mental health crisis (suicidal ideations/suicide attempt).  With written consent from the patient, the family member/significant other has been provided the following suicide prevention education, prior to the and/or following the discharge of the patient. Mother states she will be supportive to providing patient to any drug treatment at discharge.   The suicide prevention education provided includes the following:  Suicide risk factors  Suicide prevention and interventions  National Suicide Hotline telephone number  [facility] assessment telephone number  [facility] Emergency Assistance 911  [facility] and/or [facility] Crisis [facility] telephone number  Request made of family/significant other to:  Remove weapons (e.g., guns, rifles, knives), all items previously/currently identified as safety concern.    Remove drugs/medications (over-the-counter, prescriptions, illicit drugs), all items previously/currently identified as a safety concern.  The family member/significant other verbalizes understanding of the suicide prevention education information provided.  The family member/significant other agrees to remove the items of safety concern listed above.  Lain Tetterton [name] MSW, LCSWA 07/11/2016, 4:39 PM

## 2016-07-12 DIAGNOSIS — B1920 Unspecified viral hepatitis C without hepatic coma: Secondary | ICD-10-CM

## 2016-07-12 LAB — RAPID HIV SCREEN (HIV 1/2 AB+AG)
HIV 1/2 Antibodies: NONREACTIVE
HIV-1 P24 Antigen - HIV24: NONREACTIVE

## 2016-07-12 NOTE — Progress Notes (Signed)
Pt has been pleasant and cooperative. Pt denies SI and A/V halluicinations the patient has been active on the uint. 

## 2016-07-12 NOTE — [facility] Group Notes (Signed)
[facility] Group Notes:  (Nursing/MHT/Case Management/Adjunct)  Date:  07/12/2016  Time:  1:15 AM  Type of Therapy:  Group Therapy  Participation Level:  Active  Participation Quality:  Appropriate  Affect:  Appropriate  Cognitive:  Appropriate  Insight:  Appropriate  Engagement in Group:  Engaged  Modes of Intervention:  Discussion  Summary of Progress/Problems:  [name] EkJanice Marie Bacilio Pierce 07/12/2016, 1:15 AM

## 2016-07-12 NOTE — Progress Notes (Signed)
Pt denies SI/HI/AVH. Requested PRN Advil for L Leg pain. Attended evening group. Medication compliant. Affect bright. Pt interacts appropriately with staff and peers. Visible in milieu socializing with peers. Voices no additional concerns at this time.
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
## 2016-07-12 NOTE — [facility] Group Notes (Signed)
[facility] LCSW Group Therapy 07/12/2016 Group Therapy: Unhelpful thinking:  Group reviewed types of unhelpful thinking, where we see them in our lives and the effects.  Group was able to verbalize examples of unhelpful thinking and practice affirmations using affirmation cards to counteract unhelpful thinking. Pt participation:Active, made a connection with his affirmation card and expressed that was what he needs to hear and keep focused on the present rather than his past Response:  Pt able to provide 1-2 examples of unhelpful thinking they could relate to and identify how this effected their lives.  Utilized Affirmation cards to practice alternative ways of thinking and self-talk.  Jake SharkSara Franklin [place], MSW, LCSW

## 2016-07-13 MED ORDER — AMITRIPTYLINE HCL 50 MG PO TABS
75.0000 mg | ORAL_TABLET | Freq: Every day | ORAL | Status: DC
  Administered 2016-07-13 – 2016-07-14 (×2): 75 mg via ORAL
  Filled 2016-07-13 (×2): qty 2

## 2016-07-13 NOTE — [facility] Group Notes (Signed)
[facility] Group Notes:  (Nursing/MHT/Case Management/Adjunct)  Date:  07/13/2016  Time:  2:59 AM  Type of Therapy:  Psychoeducational Skills  Participation Level:  Active  Participation Quality:  Appropriate  Affect:  Appropriate  Cognitive:  Alert  Insight:  Appropriate  Engagement in Group:  Engaged  Modes of Intervention:  Activity  Summary of Progress/Problems:  [name] NeerJackie L Kristine Pierce 07/13/2016, 2:59 AM

## 2016-07-13 NOTE — Progress Notes (Signed)
Patient ID: [name], male   DOB: May 02, 1976, 40 y.o.   MRN: [id number]    CSW spoke to pt and pt reported he was to call [facility] on the morning of 07/14/16 and "see where I am on the waitlist". CSW counseled pt on two possible alternatives:  [name]  [address] [place], [place] [address] [phone] Fax: [phone]  Please arrive to the [name] for admission with your Pierce discharge paperwork, an i.d. to be admitted into long-term residential substance abuse treatment.   Pt will have to take [name] to [name] and switch bus's to [name] bus system and walk approx. 5-10 minutes to [name]    and also [name] secretary (AKA [facility] [name]):  [name]  [address] [place], [place] [address] Ph: [phone] Fax: Not required or needed by agency   Please arrive to be admitted for long-term residential substance abuse treatment and call [name] when you are on your way, in order to be admitted.   [name] number is [phone]  CSW gave pt [name] number  [name], MSW, LCSWA, LCAS

## 2016-07-13 NOTE — Progress Notes (Signed)
Pt has been pleasant and cooperative. Pt denies SI and A/V hallucinations. Pt c/o having lower back pain. Pt states the best  he has ever felt is 3/10. Pt has been active on the unit.
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
## 2016-07-14 NOTE — Progress Notes (Signed)
Recreation Therapy Notes  Date: 10.09.17 Time: 9:30 am Location: Craft Room  Group Topic: Self-expression  Goal Area(s) Addresses:  Patient will identify one color per emotion listed on wheel. Patient will verbalize benefit of using art as a means of self-expression. Patient will verbalize one emotion experienced during session. Patient will be educated on other forms of self-expression.  Behavioral Response: Attentive  Intervention: Emotion Wheel  Activity: Patients were given an Arboriculturistmotion Wheel worksheet with 7 emotions listed. Patients were instructed to pick a color for each emotion.  Education: LRT educated patients on different forms of self-expression.  Education Outcome: Acknowledges education/In group clarification offered   Clinical Observations/Feedback: Patient completed activity by picking a color for each emotion. Patient did not contribute to group discussion.  Jacquelynn CreeGreene,Milfred Krammes M, LRT/CTRS 07/14/2016 1:38 PM

## 2016-07-14 NOTE — Plan of Care (Signed)
Problem: Health Behavior/Discharge Planning: Goal: Ability to identify changes in lifestyle to reduce recurrence of condition will improve Outcome: Progressing Pt discusses with Clinical research associatewriter that he is planning to get a job after d/c. Reports motivation to stay away from substances.  Problem: Education: Goal: Emotional status will improve Outcome: Progressing Pt affect is bright this evening. He reports that he is excited about d/c.
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
## 2016-07-14 NOTE — Plan of Care (Signed)
Problem: Health Behavior/Discharge Planning: Goal: Ability to identify changes in lifestyle to reduce recurrence of condition will improve Outcome: Progressing Pt reports that he would like to go to a long term substance abuse treatment facility.  Problem: Medication: Goal: Compliance with prescribed medication regimen will improve Outcome: Progressing Pt taking medications as prescribed.

## 2016-07-14 NOTE — Progress Notes (Signed)
Patient verbalized that he feels better today because he found a place to go.Denies suicidal or homicidal ideations & AV hallucinations.Appropriate with staff & peers.Compliant with medications.Support & encouragement given.Safety maintained.

## 2016-07-14 NOTE — Progress Notes (Signed)
Patient ID: [name] ShortsLarry H Pierce, male   DOB: [birth year], 40 y.o.   MRN: [id number] CSW sat with Pt to discuss several options for residential SA treatment or sober living.  Pt provided with multiple numbers, he was able to get in touch [place] [facility] [place].  They agreed to accept  Him and did screening over the phone. He is able to arrive there tomorrow. He will coordinate getting his belongings from family and will ask them for a ride if possible, or [facility] will provide Bus fare to get [facility].  Jake SharkSara Rondy Krupinski, MSW, LCSW 07/14/2016

## 2016-07-14 NOTE — Progress Notes (Signed)
D: Pt affect is anxious this evening. He reports that he is frustrated he was not able to speak to a Child psychotherapistsocial worker for a longer period of time today. Pt states that he would like to attend a long term treatment facility, stating "I can't go to a homeless shelter. They send you out during the day and I can't walk around like that." Pt continues to c/o pain. Denies SI/HI/AVH at this time. A: Emotional support and encouragement provided. Medications administered with education. q15 minute safety checks maintained. R: Pt remains free from harm. Will continue to monitor.

## 2016-07-14 NOTE — [facility] Group Notes (Signed)
[facility] LCSW Group Therapy Note  Date/Time:  Type of Therapy and Topic:  Group Therapy:  Overcoming Obstacles  Participation Level:  ACTIVE  Description of Group:    In this group patients will be encouraged to explore what they see as obstacles to their own wellness and recovery. They will be guided to discuss their thoughts, feelings, and behaviors related to these obstacles. The group will process together ways to cope with barriers, with attention given to specific choices patients can make. Each patient will be challenged to identify changes they are motivated to make in order to overcome their obstacles. This group will be process-oriented, with patients participating in exploration of their own experiences as well as giving and receiving support and challenge from other group members.  Therapeutic Goals: 1. Patient will identify personal and current obstacles as they relate to admission. 2. Patient will identify barriers that currently interfere with their wellness or overcoming obstacles.  3. Patient will identify feelings, thought process and behaviors related to these barriers. 4. Patient will identify two changes they are willing to make to overcome these obstacles:    Summary of Patient Progress  Pt able to meet therapeutic goals.  Seeking a half-way house to change his environment and getting back into meetings are 2 changes he thought he could make.  Therapeutic Modalities:   Cognitive Behavioral Therapy Solution Focused Therapy Motivational Interviewing Relapse Prevention Therapy  Glennon MacSara P Teaira Croft, MSW, LCSW 07/14/2016, 4:51 PM

## 2016-07-14 NOTE — [facility] Group Notes (Signed)
[facility] Group Notes:  (Nursing/MHT/Case Management/Adjunct)  Date:  07/14/2016  Time:  2:18 AM  Type of Therapy:  Psychoeducational Skills  Participation Level:  Active  Participation Quality:  Appropriate, Attentive and Sharing  Affect:  Appropriate  Cognitive:  Appropriate  Insight:  Appropriate  Engagement in Group:  Engaged  Modes of Intervention:  Discussion, Socialization and Support  Summary of Progress/Problems:  [name] MilroyLaquanda Y Keshav Pierce 07/14/2016, 2:18 AM

## 2016-07-14 NOTE — [facility] Group Notes (Signed)
[facility] Group Notes:  (Nursing/MHT/Case Management/Adjunct)  Date:  07/14/2016  Time:  10:10 PM  Type of Therapy:  Group Therapy  Participation Level:  Active  Participation Quality:  Appropriate  Affect:  Appropriate  Cognitive:  Appropriate  Insight:  Appropriate  Engagement in Group:  Engaged  Modes of Intervention:  Discussion  Summary of Progress/Problems:  [name] 07/14/2016, 10:10 PM

## 2016-07-15 MED ORDER — PANTOPRAZOLE SODIUM 40 MG PO TBEC: 40.0000 mg | DELAYED_RELEASE_TABLET | Freq: Two times a day (BID) | ORAL | 0 refills | Status: DC

## 2016-07-15 MED ORDER — METHOCARBAMOL 500 MG PO TABS: 500.0000 mg | ORAL_TABLET | Freq: Three times a day (TID) | ORAL | 0 refills | Status: DC | PRN

## 2016-07-15 MED ORDER — IBUPROFEN 600 MG PO TABS: 600.0000 mg | ORAL_TABLET | Freq: Four times a day (QID) | ORAL | 0 refills | Status: DC | PRN

## 2016-07-15 MED ORDER — PREGABALIN 150 MG PO CAPS: 150.0000 mg | ORAL_CAPSULE | Freq: Two times a day (BID) | ORAL | 0 refills | Status: DC

## 2016-07-15 MED ORDER — TRAZODONE HCL 150 MG PO TABS: 150.0000 mg | ORAL_TABLET | Freq: Every day | ORAL | 0 refills | Status: DC

## 2016-07-15 MED ORDER — DICLOFENAC SODIUM 1 % TD GEL: 4.0000 g | Freq: Four times a day (QID) | TRANSDERMAL | 0 refills | Status: DC | PRN

## 2016-07-15 MED ORDER — AMITRIPTYLINE HCL 100 MG PO TABS: 100.0000 mg | ORAL_TABLET | Freq: Every day | ORAL | 0 refills | Status: DC

## 2016-07-15 NOTE — Progress Notes (Signed)
D:Patient aware of discharge this shift . Patient returning home . Patient received all belonging locked up . Patient denies  Suicidal  And homicidal ideations  .  A: Writer instructed on discharge criteria  . Informed of Discharge Summary Transitional Record Suicide Risk Assessment  And prescriptions  given to patient . Aware  Of follow up appointment . R: Patient left unit with no questions  Or concerns  With family

## 2016-07-15 NOTE — Progress Notes (Signed)
  [facility] Discharge [facility] :  Will you be returning to the same living situation after discharge:  No, Will be going to [facility] At discharge, do you have transportation home?: Yes,    Do you have the ability to pay for your medications: Yes,     Release of information consent forms completed and in the chart;  Patient's signature needed at discharge.  Patient to Follow up at: Follow-up Information    [facility]. Go on 07/21/2016.   Specialty:  Behavioral Health Why:  Please go for hospital follow up, Medication Managment and counseling between 8:30-12:00pm. Contact information: [address] [place] [address] [phone]           Next level of care provider has access to [facility] Link:no  Safety Planning and Suicide Prevention discussed: Yes,     Have you used any form of tobacco in the last 30 days? (Cigarettes, Smokeless Tobacco, Cigars, and/or Pipes): Yes  Has patient been referred to the Quitline?: Patient refused referral  Patient has been referred for addiction treatment: Yes  Glennon MacSara P Yasmeen Manka, MSW, LCSW 07/15/2016, 9:57 AM
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
## 2016-07-15 NOTE — Progress Notes (Signed)
D: Pt affect is much brighter this evening. He reports that he is looking forward to d/c tomorrow. He continues to c/o chronic pain. Rates anxiety 7/10. Denies SI/HI/AVH at this time. A: Emotional support and encouragement provided. Medications administered with education. q15 minute safety checks maintained. R: Pt remains free from harm. Will continue to monitor.

## 2016-10-03 ENCOUNTER — Inpatient Hospital Stay (HOSPITAL_COMMUNITY)
Admission: EM | Admit: 2016-10-03 | Discharge: 2016-10-05 | DRG: 443 | Disposition: A | Discharge status: Home / Self Care | Payer: Medicare PPO | Attending: Internal Medicine | Admitting: Internal Medicine

## 2016-10-03 ENCOUNTER — Encounter (HOSPITAL_COMMUNITY): Payer: Self-pay

## 2016-10-03 DIAGNOSIS — B159 Hepatitis A without hepatic coma: Principal | ICD-10-CM | POA: Diagnosis present

## 2016-10-03 DIAGNOSIS — Z8711 Personal history of peptic ulcer disease: Secondary | ICD-10-CM

## 2016-10-03 DIAGNOSIS — L299 Pruritus, unspecified: Secondary | ICD-10-CM

## 2016-10-03 DIAGNOSIS — Z880 Allergy status to penicillin: Secondary | ICD-10-CM

## 2016-10-03 DIAGNOSIS — K219 Gastro-esophageal reflux disease without esophagitis: Secondary | ICD-10-CM | POA: Diagnosis present

## 2016-10-03 DIAGNOSIS — K760 Fatty (change of) liver, not elsewhere classified: Secondary | ICD-10-CM | POA: Diagnosis present

## 2016-10-03 DIAGNOSIS — G629 Polyneuropathy, unspecified: Secondary | ICD-10-CM | POA: Diagnosis present

## 2016-10-03 DIAGNOSIS — K819 Cholecystitis, unspecified: Secondary | ICD-10-CM

## 2016-10-03 DIAGNOSIS — K279 Peptic ulcer, site unspecified, unspecified as acute or chronic, without hemorrhage or perforation: Secondary | ICD-10-CM

## 2016-10-03 DIAGNOSIS — R740 Nonspecific elevation of levels of transaminase and lactic acid dehydrogenase [LDH]: Secondary | ICD-10-CM

## 2016-10-03 DIAGNOSIS — B1920 Unspecified viral hepatitis C without hepatic coma: Secondary | ICD-10-CM | POA: Diagnosis present

## 2016-10-03 DIAGNOSIS — R1011 Right upper quadrant pain: Secondary | ICD-10-CM | POA: Diagnosis not present

## 2016-10-03 DIAGNOSIS — B169 Acute hepatitis B without delta-agent and without hepatic coma: Secondary | ICD-10-CM | POA: Diagnosis present

## 2016-10-03 DIAGNOSIS — F329 Major depressive disorder, single episode, unspecified: Secondary | ICD-10-CM | POA: Diagnosis present

## 2016-10-03 DIAGNOSIS — M25519 Pain in unspecified shoulder: Secondary | ICD-10-CM | POA: Diagnosis present

## 2016-10-03 DIAGNOSIS — G8929 Other chronic pain: Secondary | ICD-10-CM | POA: Diagnosis present

## 2016-10-03 DIAGNOSIS — R109 Unspecified abdominal pain: Secondary | ICD-10-CM

## 2016-10-03 DIAGNOSIS — R7401 Elevation of levels of liver transaminase levels: Secondary | ICD-10-CM

## 2016-10-03 DIAGNOSIS — B179 Acute viral hepatitis, unspecified: Secondary | ICD-10-CM

## 2016-10-03 DIAGNOSIS — Z800 Family history of malignant neoplasm of digestive organs: Secondary | ICD-10-CM

## 2016-10-03 DIAGNOSIS — R17 Unspecified jaundice: Secondary | ICD-10-CM

## 2016-10-03 DIAGNOSIS — K810 Acute cholecystitis: Secondary | ICD-10-CM

## 2016-10-03 DIAGNOSIS — B182 Chronic viral hepatitis C: Secondary | ICD-10-CM | POA: Diagnosis present

## 2016-10-03 DIAGNOSIS — Z87891 Personal history of nicotine dependence: Secondary | ICD-10-CM

## 2016-10-03 DIAGNOSIS — Z888 Allergy status to other drugs, medicaments and biological substances status: Secondary | ICD-10-CM

## 2016-10-03 DIAGNOSIS — M545 Low back pain: Secondary | ICD-10-CM | POA: Diagnosis present

## 2016-10-03 HISTORY — DX: Other psychoactive substance abuse, uncomplicated: F19.10

## 2016-10-03 HISTORY — DX: Unspecified viral hepatitis C without hepatic coma: B19.20

## 2016-10-03 LAB — URINALYSIS, ROUTINE W REFLEX MICROSCOPIC
GLUCOSE, UA: NEGATIVE mg/dL
HGB URINE DIPSTICK: NEGATIVE
Ketones, ur: NEGATIVE mg/dL
Leukocytes, UA: NEGATIVE
Nitrite: NEGATIVE
PH: 5 (ref 5.0–8.0)
Protein, ur: NEGATIVE mg/dL
SPECIFIC GRAVITY, URINE: 1.015 (ref 1.005–1.030)

## 2016-10-03 LAB — COMPREHENSIVE METABOLIC PANEL
ALBUMIN: 3.4 g/dL — AB (ref 3.5–5.0)
ALK PHOS: 145 U/L — AB (ref 38–126)
ALT: 1080 U/L — ABNORMAL HIGH (ref 17–63)
ANION GAP: 13 (ref 5–15)
AST: 1259 U/L — AB (ref 15–41)
BUN: 8 mg/dL (ref 6–20)
CO2: 22 mmol/L (ref 22–32)
Calcium: 9.1 mg/dL (ref 8.9–10.3)
Chloride: 99 mmol/L — ABNORMAL LOW (ref 101–111)
Creatinine, Ser: 0.88 mg/dL (ref 0.61–1.24)
GFR calc Af Amer: >60 mL/min (ref >60)
GFR calc non Af Amer: >60 mL/min (ref >60)
GLUCOSE: 115 mg/dL — AB (ref 65–99)
POTASSIUM: 3.5 mmol/L (ref 3.5–5.1)
SODIUM: 134 mmol/L — AB (ref 135–145)
Total Bilirubin: 12.8 mg/dL — ABNORMAL HIGH (ref 0.3–1.2)
Total Protein: 7.6 g/dL (ref 6.5–8.1)

## 2016-10-03 LAB — CBC
HEMATOCRIT: 42 % (ref 39.0–52.0)
HEMOGLOBIN: 15 g/dL (ref 13.0–17.0)
MCH: 31.3 pg (ref 26.0–34.0)
MCHC: 35.7 g/dL (ref 30.0–36.0)
MCV: 87.7 fL (ref 78.0–100.0)
Platelets: 259 10*3/uL (ref 150–400)
RBC: 4.79 MIL/uL (ref 4.22–5.81)
RDW: 15.3 % (ref 11.5–15.5)
WBC: 8.8 10*3/uL (ref 4.0–10.5)

## 2016-10-03 LAB — LIPASE, BLOOD: Lipase: 16 U/L (ref 11–51)

## 2016-10-03 MED ORDER — SODIUM CHLORIDE 0.9 % IV BOLUS (SEPSIS)
1000.0000 mL | Freq: Once | INTRAVENOUS | Status: AC
  Administered 2016-10-04: 1000 mL via INTRAVENOUS

## 2016-10-03 MED ORDER — FENTANYL CITRATE (PF) 100 MCG/2ML IJ SOLN
50.0000 ug | Freq: Once | INTRAMUSCULAR | Status: AC
  Administered 2016-10-04: 50 ug via INTRAVENOUS
  Filled 2016-10-03: qty 2

## 2016-10-03 NOTE — ED Notes (Signed)
Care handoff to Yasemia, RN 

## 2016-10-03 NOTE — ED Triage Notes (Signed)
Pt complaining of dark urine and nausea. Pt states stool is light and chalk colored. Pt states increased yellowing of skin. Pt with hx of cellulitis to L lower leg. Pt with itching rash across body. Pt states onset 2 days ago.
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
## 2016-10-03 NOTE — ED Notes (Signed)
PT reports " dark red" urine and "white stool" for 1 week. PT reports he has been jaundiced for 1.5 weeks. PT has hepatitis C. PT also complains of itching all over and has multiple abrasions from where he has scratched his skin. PT's PCP put him on an antibiotic Wednesday for possible cellulitis.

## 2016-10-04 ENCOUNTER — Encounter (HOSPITAL_COMMUNITY): Payer: Self-pay | Admitting: Nurse Practitioner

## 2016-10-04 ENCOUNTER — Emergency Department (HOSPITAL_COMMUNITY): Payer: Medicare PPO

## 2016-10-04 DIAGNOSIS — B179 Acute viral hepatitis, unspecified: Secondary | ICD-10-CM | POA: Diagnosis not present

## 2016-10-04 DIAGNOSIS — Z8711 Personal history of peptic ulcer disease: Secondary | ICD-10-CM | POA: Diagnosis not present

## 2016-10-04 DIAGNOSIS — B159 Hepatitis A without hepatic coma: Secondary | ICD-10-CM | POA: Diagnosis not present

## 2016-10-04 DIAGNOSIS — B182 Chronic viral hepatitis C: Secondary | ICD-10-CM | POA: Diagnosis present

## 2016-10-04 DIAGNOSIS — K219 Gastro-esophageal reflux disease without esophagitis: Secondary | ICD-10-CM

## 2016-10-04 DIAGNOSIS — G629 Polyneuropathy, unspecified: Secondary | ICD-10-CM | POA: Diagnosis present

## 2016-10-04 DIAGNOSIS — R1011 Right upper quadrant pain: Secondary | ICD-10-CM | POA: Insufficient documentation

## 2016-10-04 DIAGNOSIS — L299 Pruritus, unspecified: Secondary | ICD-10-CM

## 2016-10-04 DIAGNOSIS — K810 Acute cholecystitis: Secondary | ICD-10-CM | POA: Diagnosis not present

## 2016-10-04 DIAGNOSIS — G8929 Other chronic pain: Secondary | ICD-10-CM | POA: Diagnosis present

## 2016-10-04 DIAGNOSIS — Z888 Allergy status to other drugs, medicaments and biological substances status: Secondary | ICD-10-CM | POA: Diagnosis not present

## 2016-10-04 DIAGNOSIS — B1920 Unspecified viral hepatitis C without hepatic coma: Secondary | ICD-10-CM | POA: Diagnosis present

## 2016-10-04 DIAGNOSIS — M545 Low back pain: Secondary | ICD-10-CM | POA: Diagnosis present

## 2016-10-04 DIAGNOSIS — K760 Fatty (change of) liver, not elsewhere classified: Secondary | ICD-10-CM | POA: Diagnosis present

## 2016-10-04 DIAGNOSIS — F329 Major depressive disorder, single episode, unspecified: Secondary | ICD-10-CM | POA: Diagnosis present

## 2016-10-04 DIAGNOSIS — R17 Unspecified jaundice: Secondary | ICD-10-CM

## 2016-10-04 DIAGNOSIS — Z87891 Personal history of nicotine dependence: Secondary | ICD-10-CM | POA: Diagnosis not present

## 2016-10-04 DIAGNOSIS — M25519 Pain in unspecified shoulder: Secondary | ICD-10-CM | POA: Diagnosis present

## 2016-10-04 DIAGNOSIS — K279 Peptic ulcer, site unspecified, unspecified as acute or chronic, without hemorrhage or perforation: Secondary | ICD-10-CM

## 2016-10-04 DIAGNOSIS — Z880 Allergy status to penicillin: Secondary | ICD-10-CM | POA: Diagnosis not present

## 2016-10-04 DIAGNOSIS — K8309 Other cholangitis: Secondary | ICD-10-CM | POA: Insufficient documentation

## 2016-10-04 DIAGNOSIS — B169 Acute hepatitis B without delta-agent and without hepatic coma: Secondary | ICD-10-CM | POA: Diagnosis present

## 2016-10-04 DIAGNOSIS — Z800 Family history of malignant neoplasm of digestive organs: Secondary | ICD-10-CM | POA: Diagnosis not present

## 2016-10-04 LAB — RAPID URINE DRUG SCREEN, HOSP PERFORMED
Amphetamines: NOT DETECTED
Barbiturates: NOT DETECTED
Benzodiazepines: NOT DETECTED
COCAINE: NOT DETECTED
OPIATES: POSITIVE — AB
Tetrahydrocannabinol: NOT DETECTED

## 2016-10-04 LAB — COMPREHENSIVE METABOLIC PANEL
ALT: 966 U/L — AB (ref 17–63)
AST: 1113 U/L — AB (ref 15–41)
Albumin: 2.9 g/dL — ABNORMAL LOW (ref 3.5–5.0)
Alkaline Phosphatase: 122 U/L (ref 38–126)
Anion gap: 9 (ref 5–15)
BUN: 6 mg/dL (ref 6–20)
CHLORIDE: 100 mmol/L — AB (ref 101–111)
CO2: 26 mmol/L (ref 22–32)
Calcium: 8.5 mg/dL — ABNORMAL LOW (ref 8.9–10.3)
Creatinine, Ser: 0.88 mg/dL (ref 0.61–1.24)
GFR calc Af Amer: >60 mL/min (ref >60)
GFR calc non Af Amer: >60 mL/min (ref >60)
Glucose, Bld: 96 mg/dL (ref 65–99)
Potassium: 3.3 mmol/L — ABNORMAL LOW (ref 3.5–5.1)
SODIUM: 135 mmol/L (ref 135–145)
Total Bilirubin: 13 mg/dL — ABNORMAL HIGH (ref 0.3–1.2)
Total Protein: 6.4 g/dL — ABNORMAL LOW (ref 6.5–8.1)

## 2016-10-04 LAB — CBC WITH DIFFERENTIAL/PLATELET
Basophils Absolute: 0 10*3/uL (ref 0.0–0.1)
Basophils Relative: 1 %
EOS ABS: 0.3 10*3/uL (ref 0.0–0.7)
Eosinophils Relative: 4 %
HCT: 39.8 % (ref 39.0–52.0)
HEMOGLOBIN: 13.6 g/dL (ref 13.0–17.0)
LYMPHS ABS: 1.9 10*3/uL (ref 0.7–4.0)
Lymphocytes Relative: 25 %
MCH: 31 pg (ref 26.0–34.0)
MCHC: 34.2 g/dL (ref 30.0–36.0)
MCV: 90.7 fL (ref 78.0–100.0)
MONOS PCT: 12 %
Monocytes Absolute: 0.9 10*3/uL (ref 0.1–1.0)
Neutro Abs: 4.3 10*3/uL (ref 1.7–7.7)
Neutrophils Relative %: 58 %
Platelets: 221 10*3/uL (ref 150–400)
RBC: 4.39 MIL/uL (ref 4.22–5.81)
RDW: 15.6 % — ABNORMAL HIGH (ref 11.5–15.5)
WBC: 7.4 10*3/uL (ref 4.0–10.5)

## 2016-10-04 LAB — ETHANOL

## 2016-10-04 LAB — SALICYLATE LEVEL: Salicylate Lvl: <7 mg/dL (ref 2.8–30.0)

## 2016-10-04 LAB — PROTIME-INR
INR: 1.08
Prothrombin Time: 14 seconds (ref 11.4–15.2)

## 2016-10-04 LAB — BILIRUBIN, DIRECT: Bilirubin, Direct: 7.4 mg/dL — ABNORMAL HIGH (ref 0.1–0.5)

## 2016-10-04 LAB — ACETAMINOPHEN LEVEL

## 2016-10-04 MED ORDER — DEXTROSE-NACL 5-0.45 % IV SOLN: INTRAVENOUS | Status: DC

## 2016-10-04 MED ORDER — MORPHINE SULFATE (PF) 4 MG/ML IV SOLN
4.0000 mg | Freq: Once | INTRAVENOUS | Status: AC
  Administered 2016-10-04: 4 mg via INTRAVENOUS
  Filled 2016-10-04: qty 1

## 2016-10-04 MED ORDER — DEXTROSE 5 % IV SOLN
1.0000 g | Freq: Once | INTRAVENOUS | Status: AC
  Administered 2016-10-04: 1 g via INTRAVENOUS
  Filled 2016-10-04: qty 1

## 2016-10-04 MED ORDER — BACLOFEN 10 MG PO TABS
10.0000 mg | ORAL_TABLET | Freq: Three times a day (TID) | ORAL | Status: DC
  Administered 2016-10-04 – 2016-10-05 (×4): 10 mg via ORAL
  Filled 2016-10-04: qty 2
  Filled 2016-10-04 (×3): qty 1

## 2016-10-04 MED ORDER — DEXTROSE 5 % IV SOLN
1.0000 g | Freq: Three times a day (TID) | INTRAVENOUS | Status: DC
  Administered 2016-10-04 – 2016-10-05 (×4): 1 g via INTRAVENOUS
  Filled 2016-10-04 (×7): qty 1

## 2016-10-04 MED ORDER — AMITRIPTYLINE HCL 50 MG PO TABS
100.0000 mg | ORAL_TABLET | Freq: Every day | ORAL | Status: DC
  Administered 2016-10-04: 100 mg via ORAL
  Filled 2016-10-04: qty 2

## 2016-10-04 MED ORDER — HYDROXYZINE HCL 25 MG PO TABS
25.0000 mg | ORAL_TABLET | Freq: Four times a day (QID) | ORAL | Status: DC | PRN
  Administered 2016-10-04 – 2016-10-05 (×5): 25 mg via ORAL
  Filled 2016-10-04 (×5): qty 1

## 2016-10-04 MED ORDER — OXYCODONE HCL 5 MG PO TABS
10.0000 mg | ORAL_TABLET | Freq: Four times a day (QID) | ORAL | Status: DC
  Administered 2016-10-04 – 2016-10-05 (×7): 10 mg via ORAL
  Filled 2016-10-04 (×7): qty 2

## 2016-10-04 MED ORDER — FENTANYL CITRATE (PF) 100 MCG/2ML IJ SOLN
50.0000 ug | Freq: Once | INTRAMUSCULAR | Status: AC
  Administered 2016-10-04: 50 ug via INTRAVENOUS
  Filled 2016-10-04: qty 2

## 2016-10-04 MED ORDER — IOPAMIDOL (ISOVUE-300) INJECTION 61%
INTRAVENOUS | Status: AC
  Administered 2016-10-04: 100 mL
  Filled 2016-10-04: qty 100

## 2016-10-04 MED ORDER — METRONIDAZOLE IN NACL 5-0.79 MG/ML-% IV SOLN
500.0000 mg | Freq: Three times a day (TID) | INTRAVENOUS | Status: DC
  Administered 2016-10-04 – 2016-10-05 (×5): 500 mg via INTRAVENOUS
  Filled 2016-10-04 (×5): qty 100

## 2016-10-04 MED ORDER — PANTOPRAZOLE SODIUM 40 MG PO TBEC
40.0000 mg | DELAYED_RELEASE_TABLET | Freq: Two times a day (BID) | ORAL | Status: DC
  Administered 2016-10-04 – 2016-10-05 (×3): 40 mg via ORAL
  Filled 2016-10-04 (×3): qty 1

## 2016-10-04 MED ORDER — SODIUM CHLORIDE 0.9% FLUSH: 3.0000 mL | Freq: Two times a day (BID) | INTRAVENOUS | Status: DC

## 2016-10-04 MED ORDER — SODIUM CHLORIDE 0.9 % IV SOLN
INTRAVENOUS | Status: DC
  Administered 2016-10-04 (×2): via INTRAVENOUS

## 2016-10-04 MED ORDER — ONDANSETRON HCL 4 MG PO TABS: 4.0000 mg | ORAL_TABLET | Freq: Four times a day (QID) | ORAL | Status: DC | PRN

## 2016-10-04 MED ORDER — TRAZODONE HCL 100 MG PO TABS
100.0000 mg | ORAL_TABLET | Freq: Every day | ORAL | Status: DC
  Administered 2016-10-04: 100 mg via ORAL
  Filled 2016-10-04: qty 1

## 2016-10-04 MED ORDER — POTASSIUM CHLORIDE CRYS ER 20 MEQ PO TBCR
40.0000 meq | EXTENDED_RELEASE_TABLET | Freq: Once | ORAL | Status: AC
  Administered 2016-10-04: 40 meq via ORAL
  Filled 2016-10-04: qty 2

## 2016-10-04 MED ORDER — ONDANSETRON HCL 4 MG/2ML IJ SOLN: 4.0000 mg | Freq: Four times a day (QID) | INTRAMUSCULAR | Status: DC | PRN

## 2016-10-04 MED ORDER — METRONIDAZOLE IN NACL 5-0.79 MG/ML-% IV SOLN
500.0000 mg | Freq: Once | INTRAVENOUS | Status: AC
  Administered 2016-10-04: 500 mg via INTRAVENOUS
  Filled 2016-10-04: qty 100

## 2016-10-04 MED ORDER — PREGABALIN 100 MG PO CAPS
300.0000 mg | ORAL_CAPSULE | Freq: Two times a day (BID) | ORAL | Status: DC
  Administered 2016-10-04 – 2016-10-05 (×3): 300 mg via ORAL
  Filled 2016-10-04: qty 3
  Filled 2016-10-04: qty 4
  Filled 2016-10-04: qty 3

## 2016-10-04 NOTE — ED Notes (Signed)
Pt on phone talking w/o difficulty.
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
## 2016-10-04 NOTE — ED Provider Notes (Addendum)
  Physical Exam  BP 119/83   Pulse 80   Temp 99.2 F (37.3 C) (Oral)   Resp 24   SpO2 98%   Physical Exam  ED Course  Procedures  MDM  Pt has liver failure. Hx of Hep C. Bili is 12.8 and LFTs are elevated. Imaging ordered - and Dr. Montez Moritaarter will be informed once the imaging results return.\ INR pending.     Derwood KaplanAnkit Angellina Ferdinand, MD 10/04/16 0026   2:00am: I reassessed the patient after the US. He has generalized abd tenderness, worst over the RUQ and epigastric area. No clear Murphy's. Pain x 2 weeks. Pain not worse with po intake. Spoke with Dr. Sheliah HatchKinsinger after the US. We went over the clinical presentation and exam findings. Dr. Sheliah HatchKinsinger recommends medicine admission, given unclear hx and pending CT scan. Surgery consult to be placed if needed.  4:55 AM Dr. Montez Moritaarter to admit pt to medicine. GI already to see the patient, and I will page Surgery at 6 am. Pt's CT scan shows cholecystitis as well. No SIRS criteria. Pt will get cefepime and flagyl, given symptoms > 48 hours.   Derwood KaplanAnkit Orhan Mayorga, MD 10/04/16 [phone]
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
## 2016-10-04 NOTE — Progress Notes (Signed)
[facility] PROGRESS NOTE  Scarlette ShortsLarry H Eichorn ZOX:[id number] DOB: 1975/11/18 DOA: 10/03/2016  PCP: [name], MD  Brief History/Interval Summary: 40 year old Caucasian male with a past medical history of depression/anxiety, chronic pain, GERD, peptic ulcerative disease, peripheral neuropathy, positive hepatitis C antibody and history of substance abuse, presented to the emergency department with complaints of abdominal pain and yellowish discoloration of skin and eyes ongoing for a week. Patient was found to have abnormal LFTs. He was noted to be icteric. CT scan raised concern for cholecystitis. Patient was hospitalized for further management.  Reason for Visit: Abnormal LFTs  Consultants: Gen. surgery and gastroenterology  Procedures: None yet  Antibiotics: Cefepime and metronidazole  Subjective/Interval History: Patient continues to have some upper abdominal discomfort. He denies any nausea or vomiting currently. Did have some vomiting at home.  ROS: Denies any shortness of breath  Objective:  Vital Signs  Vitals:   10/04/16 1015 10/04/16 1045 10/04/16 1115 10/04/16 1200  BP: 121/82 112/71 120/81 125/84  Pulse: 61 73 81 90  Resp: 19 18 15 16   Temp:      TempSrc:      SpO2: 98% 98% (!) 89% 98%    Intake/Output Summary (Last 24 hours) at 10/04/16 1250 Last data filed at 10/04/16 0739  Gross per 24 hour  Intake             1150 ml  Output              375 ml  Net              775 ml   There were no vitals filed for this visit.  General appearance: alert, cooperative, appears stated age and no distress Eyes: Icteric sclera Resp: clear to auscultation bilaterally Cardio: regular rate and rhythm, S1, S2 normal, no murmur, click, rub or gallop GI: Abdomen is mildly tender in the epigastric and right upper quadrant areas. No rebound, rigidity or guarding. Otherwise soft. No masses or organomegaly. Bowel sounds are present. Extremities: extremities normal,  atraumatic, no cyanosis or edema Neurologic: Awake and alert. Oriented 3. No focal neurological deficits.  Lab Results:  Data Reviewed: I have personally reviewed following labs and imaging studies  CBC:  Recent Labs Lab 10/03/16 2008 10/04/16 0533  WBC 8.8 7.4  NEUTROABS  --  4.3  HGB 15.0 13.6  HCT 42.0 39.8  MCV 87.7 90.7  PLT 259 221    Basic Metabolic Panel:  Recent Labs Lab 10/03/16 2008 10/04/16 0533  NA 134* 135  K 3.5 3.3*  CL 99* 100*  CO2 22 26  GLUCOSE 115* 96  BUN 8 6  CREATININE 0.88 0.88  CALCIUM 9.1 8.5*    GFR: CrCl cannot be calculated (Unknown ideal weight.).  Liver Function Tests:  Recent Labs Lab 10/03/16 2008 10/04/16 0533  AST 1,259* 1,113*  ALT 1,080* 966*  ALKPHOS 145* 122  BILITOT 12.8* 13.0*  PROT 7.6 6.4*  ALBUMIN 3.4* 2.9*     Recent Labs Lab 10/03/16 2008  LIPASE 16    Coagulation Profile:  Recent Labs Lab 10/04/16 0014  INR 1.08     Radiology Studies: Ct Abdomen Pelvis W Contrast  Result Date: 10/04/2016 CLINICAL DATA:  Abdominal pain. Epigastric and left upper quadrant pain. EXAM: CT ABDOMEN AND PELVIS WITH CONTRAST TECHNIQUE: Multidetector CT imaging of the abdomen and pelvis was performed using the standard protocol following bolus administration of intravenous contrast. CONTRAST:  100mL ISOVUE-300 IOPAMIDOL (ISOVUE-300) INJECTION 61% COMPARISON:  Right  upper quadrant ultrasound earlier this day. CT abdomen/ pelvis 06/15/2016 FINDINGS: Lower chest: Lung bases are clear. Hepatobiliary: Decreased hepatic density consistent with steatosis. Partially distended gallbladder with gallbladder wall thickening, as seen on ultrasound. Mild pericholecystic edema. Known gallstones not well seen by CT Pancreas: No ductal dilatation or inflammation. Spleen: Normal in size without focal abnormality. Adrenals/Urinary Tract: Normal adrenal glands. No hydronephrosis or perinephric edema. Circumaortic left renal vein with  tortuosity of the retroaortic segment. Symmetric excretion on delayed phase imaging. Urinary bladder is minimally distended, questionable bladder wall thickening. Stomach/Bowel: Decreased esophageal wall thickening from prior. Stomach is decompressed. No bowel inflammation, wall thickening or distention. Post appendectomy. Vascular/Lymphatic: No acute vascular abnormality. Circumaortic left renal vein. Mildly prominent porta hepatis nodes, likely reactive. No retroperitoneal or pelvic adenopathy. Reproductive: Prostate is unremarkable. Other: No free air, free fluid, or intra-abdominal fluid collection. Musculoskeletal: Degenerative disc disease at L5-S1. 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 IMPRESSION: 1. Findings consistent with acute cholecystitis, congruent with ultrasound findings. 2. Hepatic steatosis. Electronically Signed   By: Rubye OaksMelanie  Ehinger M.D.   On: 10/04/2016 02:56   [place] Abdomen Limited Ruq  Result Date: 10/04/2016 CLINICAL DATA:  Abdominal pain for 2 weeks. Elevated liver function tests. EXAM: US ABDOMEN LIMITED - RIGHT UPPER QUADRANT COMPARISON:  CT abdomen and pelvis June 15, 2016. FINDINGS: Gallbladder: 3 mm echogenic mural focus with comet tail artifact. 4 mm gallbladder wall thickening. Sonographic Murphy's sign elicited. Technologist reports diffuse abdominal pain. Trace pericholecystic fluid. Common bile duct: Diameter:   3 mm Liver: Diffusely echogenic. No intrahepatic biliary dilatation. Hepatopetal portal vein. IMPRESSION: Gallbladder wall thickening and sonographic Murphy's sign concerning for acute cholecystitis. 3 mm polyp versus gallstone. Hepatic steatosis. Electronically Signed   By: Awilda Metroourtnay  Bloomer M.D.   On: 10/04/2016 01:13     Medications:  Scheduled: . amitriptyline  100 mg Oral QHS  . baclofen  10 mg Oral TID  . oxyCODONE  10 mg Oral QID  . pantoprazole  40 mg Oral BID  . pregabalin  300 mg Oral BID  . sodium chloride flush  3 mL Intravenous  Q12H  . traZODone  100 mg Oral QHS   Continuous: . sodium chloride 125 mL/hr at 10/04/16 0946  . ceFEPime (MAXIPIME) IV    . metronidazole Stopped (10/04/16 1049)   [id number]:hydrOXYzine  Assessment/Plan:  Principal Problem:   Acute hepatitis Active Problems:   GERD (gastroesophageal reflux disease)   Acute cholecystitis   Jaundice   Hyperbilirubinemia   Itching   Peptic ulcer disease    Acute hepatitis with transaminitis and hyperbilirubinemia. Imaging studies suggested cholecystitis, although this is an atypical presentation. General surgery and gastroenterology consulted. Continue to monitor closely. INR is normal. LFTs appear to be improving as of this morning. Patient is empirically on cefepime and Flagyl.  History of GERD with peptic ulcer disease. Continue with PPI.  History of peripheral neuropathy. Continue with Elavil and the workup.  History of Depression. Continue trazodone  DVT Prophylaxis: SCDs    Code Status: Full code  Family Communication: Discussed with the patient  Disposition Plan: Management as outlined above. Continue to trend LFTs. Mobilize as tolerated.    LOS: 0 days   Pione[facility] Valley Surgicenter LLCKRISHNAN,Saajan Willmon  [facility] Pager [phone] 10/04/2016, 12:50 PM  If 7PM-7AM, please contact night-coverage at [email or web address], password [email or web address]

## 2016-10-04 NOTE — ED Notes (Signed)
Gastro at bedside.

## 2016-10-04 NOTE — ED Triage Notes (Signed)
Pt instructed that his diet was clear liquids. Pt had cheese burger and started to eat . PT  REPORTED THE dr Catalina PizzaLD HIM HE COULD EAT.
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
## 2016-10-04 NOTE — Progress Notes (Signed)
Pharmacy Antibiotic Note  [name] is a 40 y.o. male admitted on 10/03/2016 with progressive liver failure.  Pharmacy has been consulted for Cefepime dosing.  Plan: Cefepime 1g IV q8h Monitor renal function and any available micro data     Temp (24hrs), Avg:99.2 F (37.3 C), Min:99.2 F (37.3 C), Max:99.2 F (37.3 C)   Recent Labs Lab 10/03/16 2008 10/04/16 0533  WBC 8.8 7.4  CREATININE 0.88 0.88    CrCl cannot be calculated (Unknown ideal weight.).    Allergies  Allergen Reactions  . Penicillin G Shortness Of Breath and Rash    Has patient had a PCN reaction causing immediate rash, facial/tongue/throat swelling, SOB or lightheadedness with hypotension: YES Has patient had a PCN reaction causing severe rash involving mucus membranes or skin necrosis:  No Has patient had a PCN reaction that required hospitalization No Has patient had a PCN reaction occurring within the last 10 years:YES If all of the above answers are "NO", then may proceed with Cephalosporin use.   [name]. Flexeril [Cyclobenzaprine] Other (See Comments)    Per pt,it makes himill Makes him extremely agitated.   [name]. Penicillins Itching and Rash    [name] Provencalurner, [name] S 10/04/2016 10:06 AM

## 2016-10-04 NOTE — ED Notes (Signed)
Care handoff to [place], [place]

## 2016-10-04 NOTE — ED Notes (Signed)
Pt called RN to room - requesting to go to bathroom d/t needs to have BM.

## 2016-10-04 NOTE — ED Notes (Signed)
Patient transported to US 

## 2016-10-04 NOTE — ED Notes (Signed)
Patient transported to CT 

## 2016-10-04 NOTE — Progress Notes (Signed)
Patient admitted from E.D.Alert and oriented x 4 .Ambulatory with steady gait.Skin assessed with Anita-skin redness but otherwise blanchable on patient's both buttocks,back of the neck ,mid upper back and peri-inguinal regions.Skin rashes on patient's chest ,abdomen and upper extremities-as per patient was given antibiotic by his PCP prior to admission and developed reaction from it.Multiple small dry scab over wound on patient bilateral lower shin.He has  An extensive left leg old surgical area otherwise it is completely healed.Patient wanting to eat as per his gastroenterology verbalization to him, he needs to be on a clear liquid diet and agreed to it as the nurse explained to him.

## 2016-10-05 DIAGNOSIS — B159 Hepatitis A without hepatic coma: Principal | ICD-10-CM

## 2016-10-05 LAB — COMPREHENSIVE METABOLIC PANEL
ALK PHOS: 109 U/L (ref 38–126)
ALT: 942 U/L — ABNORMAL HIGH (ref 17–63)
ANION GAP: 9 (ref 5–15)
AST: 1019 U/L — ABNORMAL HIGH (ref 15–41)
Albumin: 2.6 g/dL — ABNORMAL LOW (ref 3.5–5.0)
BUN: 6 mg/dL (ref 6–20)
CALCIUM: 8.3 mg/dL — AB (ref 8.9–10.3)
CO2: 24 mmol/L (ref 22–32)
Chloride: 103 mmol/L (ref 101–111)
Creatinine, Ser: 0.87 mg/dL (ref 0.61–1.24)
GFR calc non Af Amer: >60 mL/min (ref >60)
Glucose, Bld: 159 mg/dL — ABNORMAL HIGH (ref 65–99)
Potassium: 3.8 mmol/L (ref 3.5–5.1)
SODIUM: 136 mmol/L (ref 135–145)
Total Bilirubin: 13.6 mg/dL — ABNORMAL HIGH (ref 0.3–1.2)
Total Protein: 6.1 g/dL — ABNORMAL LOW (ref 6.5–8.1)

## 2016-10-05 LAB — CBC
HCT: 37.8 % — ABNORMAL LOW (ref 39.0–52.0)
HEMOGLOBIN: 13.3 g/dL (ref 13.0–17.0)
MCH: 31 pg (ref 26.0–34.0)
MCHC: 35.2 g/dL (ref 30.0–36.0)
MCV: 88.1 fL (ref 78.0–100.0)
PLATELETS: 215 10*3/uL (ref 150–400)
RBC: 4.29 MIL/uL (ref 4.22–5.81)
RDW: 16 % — ABNORMAL HIGH (ref 11.5–15.5)
WBC: 7.6 10*3/uL (ref 4.0–10.5)

## 2016-10-05 LAB — HEPATITIS PANEL, ACUTE
HCV Ab: >11 s/co ratio — ABNORMAL HIGH (ref 0.0–0.9)
HEP B S AG: POSITIVE — AB
Hep A IgM: POSITIVE — AB
Hep B C IgM: POSITIVE — AB

## 2016-10-05 LAB — PROTIME-INR
INR: 1.04
PROTHROMBIN TIME: 13.7 s (ref 11.4–15.2)

## 2016-10-05 MED ORDER — HYDROXYZINE HCL 25 MG PO TABS
25.0000 mg | ORAL_TABLET | Freq: Three times a day (TID) | ORAL | 0 refills | Status: DC | PRN
Start: 2016-10-05 — End: 2016-10-17

## 2016-10-05 NOTE — Progress Notes (Signed)
Assessment Principal Problem:   Acute hepatitis-LFTs minimally decreased except for bilirubin which is slightly increased. Active Problems:   3 mm gallbladder polyp or stone-no evidence of acute cholecystitis  Plan:  No indication for cholecystectomy or other acute surgical intervention.  Will sign off.   LOS: 1 day        Subjective: Still with some RUQ pain.  Itching.   Objective: Vital signs in last 24 hours: Temp:  [97.7 F (36.5 C)-98.3 F (36.8 C)] 97.7 F (36.5 C) (12/31 0500) Pulse Rate:  [60-93] 69 (12/31 0500) Resp:  [15-24] 20 (12/31 0500) BP: (106-131)/(66-90) 107/74 (12/31 0500) SpO2:  [89 %-100 %] 97 % (12/31 0500) Weight:  [84.8 kg (187 lb)] 84.8 kg (187 lb) (12/30 2212) Last BM Date: 10/04/16  Intake/Output from previous day: 12/30 0701 - 12/31 0700 In: 2745 [P.O.:480; I.V.:1865; IV Piggyback:400] Out: 1885 [Urine:1885] Intake/Output this shift: No intake/output data recorded.  PE: General- In NAD Abdomen-soft, mild RUQ tenderness, no guarding.  Lab Results:   Recent Labs  10/04/16 0533 10/05/16 0325  WBC 7.4 7.6  HGB 13.6 13.3  HCT 39.8 37.8*  PLT 221 215   BMET  Recent Labs  10/04/16 0533 10/05/16 0325  NA 135 136  K 3.3* 3.8  CL 100* 103  CO2 26 24  GLUCOSE 96 159*  BUN 6 6  CREATININE 0.88 0.87  CALCIUM 8.5* 8.3*   PT/INR  Recent Labs  10/04/16 0014 10/05/16 0325  LABPROT 14.0 13.7  INR 1.08 1.04   Comprehensive Metabolic Panel:    Component Value Date/Time   NA 136 10/05/2016 0325   NA 135 10/04/2016 0533   K 3.8 10/05/2016 0325   K 3.3 (L) 10/04/2016 0533   CL 103 10/05/2016 0325   CL 100 (L) 10/04/2016 0533   CO2 24 10/05/2016 0325   CO2 26 10/04/2016 0533   BUN 6 10/05/2016 0325   BUN 6 10/04/2016 0533   CREATININE 0.87 10/05/2016 0325   CREATININE 0.88 10/04/2016 0533   CREATININE 1.06 01/12/2014 1200   CREATININE 0.84 12/20/2013 1710   GLUCOSE 159 (H) 10/05/2016 0325   GLUCOSE 96 10/04/2016 0533    CALCIUM 8.3 (L) 10/05/2016 0325   CALCIUM 8.5 (L) 10/04/2016 0533   AST 1,019 (H) 10/05/2016 0325   AST 1,113 (H) 10/04/2016 0533   ALT 942 (H) 10/05/2016 0325   ALT 966 (H) 10/04/2016 0533   ALKPHOS 109 10/05/2016 0325   ALKPHOS 122 10/04/2016 0533   BILITOT 13.6 (H) 10/05/2016 0325   BILITOT 13.0 (H) 10/04/2016 0533   PROT 6.1 (L) 10/05/2016 0325   PROT 6.4 (L) 10/04/2016 0533   ALBUMIN 2.6 (L) 10/05/2016 0325   ALBUMIN 2.9 (L) 10/04/2016 0533     Studies/Results: Ct Abdomen Pelvis W Contrast  Result Date: 10/04/2016 CLINICAL DATA:  Abdominal pain. Epigastric and left upper quadrant pain. EXAM: CT ABDOMEN AND PELVIS WITH CONTRAST TECHNIQUE: Multidetector CT imaging of the abdomen and pelvis was performed using the standard protocol following bolus administration of intravenous contrast. CONTRAST:  100mL ISOVUE-300 IOPAMIDOL (ISOVUE-300) INJECTION 61% COMPARISON:  Right upper quadrant ultrasound earlier this day. CT abdomen/ pelvis 06/15/2016 FINDINGS: Lower chest: Lung bases are clear. Hepatobiliary: Decreased hepatic density consistent with steatosis. Partially distended gallbladder with gallbladder wall thickening, as seen on ultrasound. Mild pericholecystic edema. Known gallstones not well seen by CT Pancreas: No ductal dilatation or inflammation. Spleen: Normal in size without focal abnormality. Adrenals/Urinary Tract: Normal adrenal glands. No hydronephrosis or perinephric edema. Circumaortic  left renal vein with tortuosity of the retroaortic segment. Symmetric excretion on delayed phase imaging. Urinary bladder is minimally distended, questionable bladder wall thickening. Stomach/Bowel: Decreased esophageal wall thickening from prior. Stomach is decompressed. No bowel inflammation, wall thickening or distention. Post appendectomy. Vascular/Lymphatic: No acute vascular abnormality. Circumaortic left renal vein. Mildly prominent porta hepatis nodes, likely reactive. No retroperitoneal or  pelvic adenopathy. Reproductive: Prostate is unremarkable. Other: No free air, free fluid, or intra-abdominal fluid collection. Musculoskeletal: Degenerative disc disease at L5-S1. 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 IMPRESSION: 1. Findings consistent with acute cholecystitis, congruent with ultrasound findings. 2. Hepatic steatosis. Electronically Signed   By: Rubye OaksMelanie  Ehinger M.D.   On: 10/04/2016 02:56   [place] Abdomen Limited Ruq  Result Date: 10/04/2016 CLINICAL DATA:  Abdominal pain for 2 weeks. Elevated liver function tests. EXAM: US ABDOMEN LIMITED - RIGHT UPPER QUADRANT COMPARISON:  CT abdomen and pelvis June 15, 2016. FINDINGS: Gallbladder: 3 mm echogenic mural focus with comet tail artifact. 4 mm gallbladder wall thickening. Sonographic Murphy's sign elicited. Technologist reports diffuse abdominal pain. Trace pericholecystic fluid. Common bile duct: Diameter:   3 mm Liver: Diffusely echogenic. No intrahepatic biliary dilatation. Hepatopetal portal vein. IMPRESSION: Gallbladder wall thickening and sonographic Murphy's sign concerning for acute cholecystitis. 3 mm polyp versus gallstone. Hepatic steatosis. Electronically Signed   By: Awilda Metroourtnay  Bloomer M.D.   On: 10/04/2016 01:13    Anti-infectives: Anti-infectives    Start     Dose/Rate Route Frequency Ordered Stop   10/04/16 1400  ceFEPIme (MAXIPIME) 1 g in dextrose 5 % 50 mL IVPB     1 g 100 mL/hr over 30 Minutes Intravenous Every 8 hours 10/04/16 1008     10/04/16 0945  metroNIDAZOLE (FLAGYL) IVPB 500 mg     500 mg 100 mL/hr over 60 Minutes Intravenous Every 8 hours 10/04/16 0939     10/04/16 0445  ceFEPIme (MAXIPIME) 1 g in dextrose 5 % 50 mL IVPB     1 g 100 mL/hr over 30 Minutes Intravenous  Once 10/04/16 0423 10/04/16 0658   10/04/16 0430  metroNIDAZOLE (FLAGYL) IVPB 500 mg     500 mg 100 mL/hr over 60 Minutes Intravenous  Once 10/04/16 0423 10/04/16 0658       Ladarrell Cornwall J 10/05/2016

## 2016-10-05 NOTE — Discharge Summary (Signed)
[facility]  Physician Discharge Summary   Patient ID: [name] ShortsLarry H Pierce MRN: [id number] DOB/AGE: 12-04-1975 40 y.o.  Admit date: 10/03/2016 Discharge date: 10/05/2016  PCP: [name], MD  DISCHARGE DIAGNOSES:  Principal Problem:   Acute hepatitis Active Problems:   GERD (gastroesophageal reflux disease)   Acute cholecystitis   Jaundice   Hyperbilirubinemia   Itching   Peptic ulcer disease   RECOMMENDATIONS FOR OUTPATIENT FOLLOW UP: 1. Gastroenterology to arrange one week follow-up for blood work to check liver function   DISCHARGE CONDITION: fair  Diet recommendation: As before  [facility]: Lambert Rhonda WFiled Weights   10/04/16 1815 10/04/16 2212  Weight: 84.8 kg (187 lb) 84.8 kg (187 lb)    INITIAL HISTORY: 40 year old Caucasian male with a past medical history of depression/anxiety, chronic pain, GERD, peptic ulcerative disease, peripheral neuropathy, positive hepatitis C antibody and history of substance abuse, presented to the emergency department with complaints of abdominal pain and yellowish discoloration of skin and eyes ongoing for a week. Patient was found to have abnormal LFTs. He was noted to be icteric. CT scan raised concern for cholecystitis. Patient was hospitalized for further management.  Consultations:  General surgery and gastroenterology  Procedures:  None  HOSPITAL COURSE:   Acute hepatitis with transaminitis and hyperbilirubinemia. Patient admitted for further evaluation of jaundice. Imaging studies suggested gallbladder wall thickening. He did have remarkably abnormal LFTs, including hyperbilirubinemia. It was felt that his presentation was most likely due to hepatitis rather than cholecystitis. He was seen by general surgery and gastroenterology. Surprisingly, his hepatitis panel was positive for hepatitis A, B, and C. Patient educated by gastroenterology regarding his acute illness. Discussed with GI today. Okay for him to go home. They will arrange  outpatient follow-up and outpatient blood work as well. Discussed with patient. He is agreeable to go home today. Patient's INR is normal. Synthetic function is normal. No need to continue antibiotics. Patient is tolerating his diet.   History of GERD with peptic ulcer disease. Continue with PPI.  History of peripheral neuropathy. Continue with home medications  History of Depression. Continue trazodone  Overall, stable. Okay for discharge home today.  PERTINENT LABS:  The results of significant diagnostics from this hospitalization (including imaging, microbiology, ancillary and laboratory) are listed below for reference.      Labs: Basic Metabolic Panel:  Recent Labs Lab 10/03/16 2008 10/04/16 0533 10/05/16 0325  NA 134* 135 136  K 3.5 3.3* 3.8  CL 99* 100* 103  CO2 22 26 24   GLUCOSE 115* 96 159*  BUN 8 6 6   CREATININE 0.88 0.88 0.87  CALCIUM 9.1 8.5* 8.3*   Liver Function Tests:  Recent Labs Lab 10/03/16 2008 10/04/16 0533 10/05/16 0325  AST 1,259* 1,113* 1,019*  ALT 1,080* 966* 942*  ALKPHOS 145* 122 109  BILITOT 12.8* 13.0* 13.6*  PROT 7.6 6.4* 6.1*  ALBUMIN 3.4* 2.9* 2.6*    Recent Labs Lab 10/03/16 2008  LIPASE 16   CBC:  Recent Labs Lab 10/03/16 2008 10/04/16 0533 10/05/16 0325  WBC 8.8 7.4 7.6  NEUTROABS  --  4.3  --   HGB 15.0 13.6 13.3  HCT 42.0 39.8 37.8*  MCV 87.7 90.7 88.1  PLT 259 221 215    IMAGING STUDIES Ct Abdomen Pelvis W Contrast  Result Date: 10/04/2016 CLINICAL DATA:  Abdominal pain. Epigastric and left upper quadrant pain. EXAM: CT ABDOMEN AND PELVIS WITH CONTRAST TECHNIQUE: Multidetector CT imaging of the abdomen and pelvis was performed using the standard protocol following  bolus administration of intravenous contrast. CONTRAST:  100mL ISOVUE-300 IOPAMIDOL (ISOVUE-300) INJECTION 61% COMPARISON:  Right upper quadrant ultrasound earlier this day. CT abdomen/ pelvis 06/15/2016 FINDINGS: Lower chest: Lung bases are  clear. Hepatobiliary: Decreased hepatic density consistent with steatosis. Partially distended gallbladder with gallbladder wall thickening, as seen on ultrasound. Mild pericholecystic edema. Known gallstones not well seen by CT Pancreas: No ductal dilatation or inflammation. Spleen: Normal in size without focal abnormality. Adrenals/Urinary Tract: Normal adrenal glands. No hydronephrosis or perinephric edema. Circumaortic left renal vein with tortuosity of the retroaortic segment. Symmetric excretion on delayed phase imaging. Urinary bladder is minimally distended, questionable bladder wall thickening. Stomach/Bowel: Decreased esophageal wall thickening from prior. Stomach is decompressed. No bowel inflammation, wall thickening or distention. Post appendectomy. Vascular/Lymphatic: No acute vascular abnormality. Circumaortic left renal vein. Mildly prominent porta hepatis nodes, likely reactive. No retroperitoneal or pelvic adenopathy. Reproductive: Prostate is unremarkable. Other: No free air, free fluid, or intra-abdominal fluid collection. Musculoskeletal: Degenerative disc disease at L5-S1. 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 IMPRESSION: 1. Findings consistent with acute cholecystitis, congruent with ultrasound findings. 2. Hepatic steatosis. Electronically Signed   By: Rubye OaksMelanie  Ehinger M.D.   On: 10/04/2016 02:56   [place] Abdomen Limited Ruq  Result Date: 10/04/2016 CLINICAL DATA:  Abdominal pain for 2 weeks. Elevated liver function tests. EXAM: US ABDOMEN LIMITED - RIGHT UPPER QUADRANT COMPARISON:  CT abdomen and pelvis June 15, 2016. FINDINGS: Gallbladder: 3 mm echogenic mural focus with comet tail artifact. 4 mm gallbladder wall thickening. Sonographic Murphy's sign elicited. Technologist reports diffuse abdominal pain. Trace pericholecystic fluid. Common bile duct: Diameter:   3 mm Liver: Diffusely echogenic. No intrahepatic biliary dilatation. Hepatopetal portal vein. IMPRESSION:  Gallbladder wall thickening and sonographic Murphy's sign concerning for acute cholecystitis. 3 mm polyp versus gallstone. Hepatic steatosis. Electronically Signed   By: Awilda Metroourtnay  Bloomer M.D.   On: 10/04/2016 01:13    DISCHARGE EXAMINATION: Vitals:   10/04/16 1815 10/04/16 2212 10/05/16 0500 10/05/16 1036  BP: 121/80 131/84 107/74 118/76  Pulse: 85 74 69 71  Resp: (!) 22 (!) 21 20 18   Temp: 98 F (36.7 C) 98.3 F (36.8 C) 97.7 F (36.5 C) 97.4 F (36.3 C)  TempSrc: Oral Oral Oral Oral  SpO2: 100% 99% 97% 100%  Weight: 84.8 kg (187 lb) 84.8 kg (187 lb)    Height: 5\' 9"  (1.753 m)      General appearance: alert, cooperative, appears stated age and no distress Resp: clear to auscultation bilaterally Cardio: regular rate and rhythm, S1, S2 normal, no murmur, click, rub or gallop GI: soft, non-tender; bowel sounds normal; no masses,  no organomegaly Extremities: extremities normal, atraumatic, no cyanosis or edema  DISPOSITION: Home  Discharge Instructions    Call MD for:  difficulty breathing, headache or visual disturbances    Complete by:  As directed    Call MD for:  extreme fatigue    Complete by:  As directed    Call MD for:  persistant dizziness or light-headedness    Complete by:  As directed    Call MD for:  persistant nausea and vomiting    Complete by:  As directed    Call MD for:  severe uncontrolled pain    Complete by:  As directed    Call MD for:  temperature >100.4    Complete by:  As directed    Discharge instructions    Complete by:  As directed    Please follow-up with your primary care  provider for posthospitalization follow-up within 1 week. The gastroenterologists office will schedule blood work to make sure your liver tests are improving. Please talk to your primary care provider regarding pain medications.  You were cared for by a hospitalist during your hospital stay. If you have any questions about your discharge medications or the care you received  while you were in the hospital after you are discharged, you can call the unit and asked to speak with the hospitalist on call if the hospitalist that took care of you is not available. Once you are discharged, your primary care physician will handle any further medical issues. Please note that NO REFILLS for any discharge medications will be authorized once you are discharged, as it is imperative that you return to your primary care physician (or establish a relationship with a primary care physician if you do not have one) for your aftercare needs so that they can reassess your need for medications and monitor your lab values. If you do not have a primary care physician, you can call [phone] for a physician referral.   Increase activity slowly    Complete by:  As directed       ALLERGIES:  Allergies  Allergen Reactions  . Penicillin G Shortness Of Breath and Rash    Has patient had a PCN reaction causing immediate rash, facial/tongue/throat swelling, SOB or lightheadedness with hypotension: YES Has patient had a PCN reaction causing severe rash involving mucus membranes or skin necrosis:  No Has patient had a PCN reaction that required hospitalization No Has patient had a PCN reaction occurring within the last 10 years:YES If all of the above answers are "NO", then may proceed with Cephalosporin use.   [name] [Cyclobenzaprine] Other (See Comments)    Per pt,it makes himill Makes him extremely agitated.   [name] Penicillins Itching and Rash     Current Discharge Medication List    START taking these medications   Details  hydrOXYzine (ATARAX/VISTARIL) 25 MG tablet Take 1 tablet (25 mg total) by mouth every 8 (eight) hours as needed for itching. Qty: 20 tablet, Refills: 0      CONTINUE these medications which have NOT CHANGED   Details  amitriptyline (ELAVIL) 100 MG tablet Take 1 tablet (100 mg total) by mouth at bedtime. Qty: 30 tablet, Refills: 0    baclofen (LIORESAL) 10 MG  tablet Take 10 mg by mouth 3 (three) times daily.    esomeprazole (NEXIUM) 40 MG capsule Take 40 mg by mouth daily.     Oxycodone HCl 10 MG TABS Take 10 mg by mouth 4 (four) times daily.    pregabalin (LYRICA) 300 MG capsule Take 300 mg by mouth 2 (two) times daily.    traZODone (DESYREL) 100 MG tablet Take 100 mg by mouth at bedtime.         Follow-up Information    Charlie Pitter III, MD Follow up.   Specialty:  Gastroenterology Why:  His office will call to schedule blood work in one week Contact information: [address] [place] [address] [phone]        Jearld Lesch, MD. Schedule an appointment as soon as possible for a visit in 1 week(s).   Specialty:  Family Medicine Why:  Posthospitalization follow-up and for any pain medications Contact information: [address] [place] [address] [phone]           TOTAL DISCHARGE TIME: 35 minutes  Tandre Conly  [facility] Pager [phone]  10/05/2016, 4:04 PM

## 2016-10-05 NOTE — Discharge Instructions (Signed)
Hepatitis A Hepatitis A is a liver infection caused by a virus. Most cases of hepatitis A are fairly mild. Hepatitis A can spread from person to person by:  Drinking or eating unclean water or food.  Having sex with someone who is infected.  Coming into contact with the poop (feces) of a person who is infected. You may then pass the virus from your hands to your mouth. Follow these instructions at home:  Rest. Make sure you:  Get enough sleep. Do not stay up late.  Keep the same bedtime hours on weekends and weekdays.  Take naps or rest breaks when you feel tired.  Wash your hands:  After using the bathroom.  Before touching food or water.  Do not take any medicines or supplements unless your doctor says it is okay.  Tell your doctor about all of the people you live with or with whom you have close contact. Your doctor may suggest that they get the hepatitis A vaccine.  Follow your doctor's instructions on how to avoid spreading the virus.  Do not drink alcohol until your doctor says it is okay.  Ask your doctor when you may go back to school or work. Contact a doctor if: You have a fever. Get help right away if:  You cannot eat or drink.  You feel sick to your stomach (nauseous) or throw up (vomit).  You get confused.  Your yellowing of the skin and the whites of your eyes (jaundice) gets worse.  You are very sleepy or have trouble waking up. This information is not intended to replace advice given to you by your health care provider. Make sure you discuss any questions you have with your health care provider. Document Released: 10/25/2010 Document Revised: 02/28/2016 Document Reviewed: 12/28/2013 Elsevier Interactive Patient Education  2017 [employer] Inc.   Hepatitis B Hepatitis B is a liver infection. It is caused by a germ. This germ is passed from person to person through:  Blood.  Birth.  Sex.  Body fluids, like breast milk, tears, and spit  (saliva). Follow these instructions at home:  Rest when you feel tired.  Avoid alcohol.  Take medicines only as told by your doctor.  Do not take any medicine unless your doctor says it is okay. This includes fever or pain medicine.  Do not have sex unless your doctor says it is okay.  Do not share toothbrushes, nail clippers, razors, or needles with others. Get help right away if:  You are not able to eat or drink.  You have a fever and feel sick to your stomach (nauseous) or throw up (vomit).  You feel confused.  Your skin or the whites of your eyes look yellow (jaundice).  You have trouble breathing.  You get a rash.  Your skin, throat, mouth, or face becomes puffy (swollen).  You start twitching or shaking (seizure).  You are very sleepy and have trouble waking up. This information is not intended to replace advice given to you by your health care provider. Make sure you discuss any questions you have with your health care provider. Document Released: 02/08/2009 Document Revised: 02/28/2016 Document Reviewed: 12/30/2013 Elsevier Interactive Patient Education  2017 [employer]

## 2016-10-05 NOTE — Progress Notes (Signed)
[facility] Progress Note  Chief Complaint:   RUQ pain, elevated LFTs  Subjective: feels better today, less pain. Hungry  Objective:  Vital signs in last 24 hours: Temp:  [97.7 F (36.5 C)-98.3 F (36.8 C)] 97.7 F (36.5 C) (12/31 0500) Pulse Rate:  [60-93] 69 (12/31 0500) Resp:  [15-24] 20 (12/31 0500) BP: (106-131)/(66-90) 107/74 (12/31 0500) SpO2:  [89 %-100 %] 97 % (12/31 0500) Weight:  [187 lb (84.8 kg)] 187 lb (84.8 kg) (12/30 2212) Last BM Date: 10/04/16 General:   Alert, well-developed, white male in NAD Heart:  Regular rate and rhythm; no murmurs. no lower extremity edema Abdomen:  Soft, nondistended, nontender.  Normal bowel sounds, no masses felt. No hepatomegaly.    Neurologic:  Alert and  oriented x4;  grossly normal neurologically. Psych:  Alert and cooperative. Normal mood and affect.  Intake/Output from previous day: 12/30 0701 - 12/31 0700 In: 2745 [P.O.:480; I.V.:1865; IV Piggyback:400] Out: 1885 [Urine:1885] Intake/Output this shift: No intake/output data recorded.  Lab Results:  Recent Labs  10/03/16 2008 10/04/16 0533 10/05/16 0325  WBC 8.8 7.4 7.6  HGB 15.0 13.6 13.3  HCT 42.0 39.8 37.8*  PLT 259 221 215   BMET  Recent Labs  10/03/16 2008 10/04/16 0533 10/05/16 0325  NA 134* 135 136  K 3.5 3.3* 3.8  CL 99* 100* 103  CO2 22 26 24   GLUCOSE 115* 96 159*  BUN 8 6 6   CREATININE 0.88 0.88 0.87  CALCIUM 9.1 8.5* 8.3*   LFT  Recent Labs  10/03/16 2008  10/05/16 0325  PROT 7.6  < > 6.1*  ALBUMIN 3.4*  < > 2.6*  AST 1,259*  < > 1,019*  ALT 1,080*  < > 942*  ALKPHOS 145*  < > 109  BILITOT 12.8*  < > 13.6*  BILIDIR 7.4*  --   --   < > = values in this interval not displayed. PT/INR  Recent Labs  10/04/16 0014 10/05/16 0325  LABPROT 14.0 13.7  INR 1.08 1.04   Hepatitis Panel  Recent Labs  10/04/16 0014  HEPBSAG Positive*  HCVAB >11.0*  HEPAIGM Positive*  HEPBIGM Positive*    Ct Abdomen Pelvis W  Contrast  Result Date: 10/04/2016 CLINICAL DATA:  Abdominal pain. Epigastric and left upper quadrant pain. EXAM: CT ABDOMEN AND PELVIS WITH CONTRAST TECHNIQUE: Multidetector CT imaging of the abdomen and pelvis was performed using the standard protocol following bolus administration of intravenous contrast. CONTRAST:  100mL ISOVUE-300 IOPAMIDOL (ISOVUE-300) INJECTION 61% COMPARISON:  Right upper quadrant ultrasound earlier this day. CT abdomen/ pelvis 06/15/2016 FINDINGS: Lower chest: Lung bases are clear. Hepatobiliary: Decreased hepatic density consistent with steatosis. Partially distended gallbladder with gallbladder wall thickening, as seen on ultrasound. Mild pericholecystic edema. Known gallstones not well seen by CT Pancreas: No ductal dilatation or inflammation. Spleen: Normal in size without focal abnormality. Adrenals/Urinary Tract: Normal adrenal glands. No hydronephrosis or perinephric edema. Circumaortic left renal vein with tortuosity of the retroaortic segment. Symmetric excretion on delayed phase imaging. Urinary bladder is minimally distended, questionable bladder wall thickening. Stomach/Bowel: Decreased esophageal wall thickening from prior. Stomach is decompressed. No bowel inflammation, wall thickening or distention. Post appendectomy. Vascular/Lymphatic: No acute vascular abnormality. Circumaortic left renal vein. Mildly prominent porta hepatis nodes, likely reactive. No retroperitoneal or pelvic adenopathy. Reproductive: Prostate is unremarkable. Other: No free air, free fluid, or intra-abdominal fluid collection. Musculoskeletal: Degenerative disc disease at L5-S1. 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 IMPRESSION: 1. Findings consistent  with acute cholecystitis, congruent with ultrasound findings. 2. Hepatic steatosis. Electronically Signed   By: Rubye OaksMelanie  Ehinger M.D.   On: 10/04/2016 02:56   [place] Abdomen Limited Ruq  Result Date: 10/04/2016 CLINICAL DATA:  Abdominal pain  for 2 weeks. Elevated liver function tests. EXAM: US ABDOMEN LIMITED - RIGHT UPPER QUADRANT COMPARISON:  CT abdomen and pelvis June 15, 2016. FINDINGS: Gallbladder: 3 mm echogenic mural focus with comet tail artifact. 4 mm gallbladder wall thickening. Sonographic Murphy's sign elicited. Technologist reports diffuse abdominal pain. Trace pericholecystic fluid. Common bile duct: Diameter:   3 mm Liver: Diffusely echogenic. No intrahepatic biliary dilatation. Hepatopetal portal vein. IMPRESSION: Gallbladder wall thickening and sonographic Murphy's sign concerning for acute cholecystitis. 3 mm polyp versus gallstone. Hepatic steatosis. Electronically Signed   By: Awilda Metroourtnay  Bloomer M.D.   On: 10/04/2016 01:13    Assessment / Plan:  Elevated LFTs. Transaminases improved some, Tbili still high . Acute viral hepatitis panel with unusual results. Both HBc IgM and HAV IgM are positive. His HCV antibody is positive but known HCV infection. He has sexual risk factors for transmission. Hepatitis A should clear. He may self-eradicate hepatitis B but it could become chronic.I'm adding in HIV antibody since he has risk factors.  Nothing to do as inpatient. He is stable for discharge from GI standpoint. Our office will call him next week to come for follow up labs. Regular diet given  RUQ pain. Surgery evaluated and also feel it is unlikely that patient has cholecystitis. Again, his pain is largely positional and he fell on right side in October.     LOS: 1 day   Willette Clusteraula Corgan Mormile  10/05/2016, 9:04 AM  Pager number [phone]

## 2016-10-08 ENCOUNTER — Other Ambulatory Visit: Payer: Self-pay

## 2016-10-08 DIAGNOSIS — B179 Acute viral hepatitis, unspecified: Secondary | ICD-10-CM

## 2016-10-08 NOTE — Progress Notes (Signed)
Spoke with the patient. Confirmed he will come in on Friday 10/10/16 for non-fasting lab work.

## 2016-10-10 ENCOUNTER — Other Ambulatory Visit (INDEPENDENT_AMBULATORY_CARE_PROVIDER_SITE_OTHER): Payer: Medicare PPO

## 2016-10-10 DIAGNOSIS — B179 Acute viral hepatitis, unspecified: Secondary | ICD-10-CM | POA: Diagnosis not present

## 2016-10-10 LAB — HEPATIC FUNCTION PANEL
ALT: 1004 U/L — ABNORMAL HIGH (ref 0–53)
AST: 1254 U/L — ABNORMAL HIGH (ref 0–37)
Albumin: 3.6 g/dL (ref 3.5–5.2)
Alkaline Phosphatase: 116 U/L (ref 39–117)
BILIRUBIN TOTAL: 20.9 mg/dL — AB (ref 0.2–1.2)
Bilirubin, Direct: 11.3 mg/dL — ABNORMAL HIGH (ref 0.0–0.3)
Total Protein: 6.6 g/dL (ref 6.0–8.3)

## 2016-10-10 LAB — PROTIME-INR
INR: 1.3 ratio — AB (ref 0.8–1.0)
PROTHROMBIN TIME: 13.9 s — AB (ref 9.6–13.1)

## 2016-10-12 LAB — RNA QUALITATIVE: HIV 1 RNA Qualitative: UNDETERMINED

## 2016-10-12 LAB — HIV 1/2 AB DIFFERENTIATION
HIV 1 Ab: NEGATIVE
HIV 2 Ab: NEGATIVE

## 2016-10-12 LAB — HIV ANTIBODY (ROUTINE TESTING W REFLEX)

## 2016-10-13 ENCOUNTER — Telehealth: Payer: Self-pay | Admitting: Nurse Practitioner

## 2016-10-13 DIAGNOSIS — R7989 Other specified abnormal findings of blood chemistry: Secondary | ICD-10-CM

## 2016-10-13 DIAGNOSIS — R945 Abnormal results of liver function studies: Principal | ICD-10-CM

## 2016-10-13 NOTE — Telephone Encounter (Signed)
Spoke with pt and he is aware, orders in for labs.

## 2016-10-13 NOTE — Telephone Encounter (Signed)
Pt calling for lab results that were done 10/10/16. Willette ClusterPaula Guenther NP ordered these labs. She is not in the office today. Please advise regarding lab results.  Also pt states he is itching terribly on his stomach, legs, and arms. States the hydroxyzine is not helping. Please advise.

## 2016-10-13 NOTE — Telephone Encounter (Signed)
I reviewed the labs.  Liver function labs worse than last week, but that was expected.  That tells us that his immune system is fighting the hepatitis viruses.  Recheck hepatic function panel and PT/INR today or tomorrow.  I am afraid there are no other medicines that will help with the itching.  He will have to wait it out until the liver recovers.

## 2016-10-14 ENCOUNTER — Other Ambulatory Visit: Payer: Medicare PPO

## 2016-10-14 ENCOUNTER — Other Ambulatory Visit: Payer: Self-pay

## 2016-10-14 ENCOUNTER — Other Ambulatory Visit (INDEPENDENT_AMBULATORY_CARE_PROVIDER_SITE_OTHER): Payer: Medicare PPO

## 2016-10-14 ENCOUNTER — Telehealth: Payer: Self-pay | Admitting: Gastroenterology

## 2016-10-14 DIAGNOSIS — R7989 Other specified abnormal findings of blood chemistry: Secondary | ICD-10-CM

## 2016-10-14 DIAGNOSIS — B179 Acute viral hepatitis, unspecified: Secondary | ICD-10-CM

## 2016-10-14 DIAGNOSIS — R945 Abnormal results of liver function studies: Principal | ICD-10-CM

## 2016-10-14 LAB — HEPATIC FUNCTION PANEL
ALT: 762 U/L — ABNORMAL HIGH (ref 0–53)
AST: 1321 U/L — AB (ref 0–37)
Albumin: 3.5 g/dL (ref 3.5–5.2)
Alkaline Phosphatase: 127 U/L — ABNORMAL HIGH (ref 39–117)
BILIRUBIN TOTAL: 15.9 mg/dL — AB (ref 0.2–1.2)
Bilirubin, Direct: 8.9 mg/dL — ABNORMAL HIGH (ref 0.0–0.3)
Total Protein: 6.8 g/dL (ref 6.0–8.3)

## 2016-10-14 LAB — PROTIME-INR
INR: 1.2 ratio — AB (ref 0.8–1.0)
Prothrombin Time: 12.7 s (ref 9.6–13.1)

## 2016-10-14 NOTE — Telephone Encounter (Signed)
Patient said that he is now having constipation, stomach bloated, did have a bm last night, unable to move bowels today. Drinking plenty of water, is eating some solid foods now. Please advise.

## 2016-10-14 NOTE — Telephone Encounter (Addendum)
Most likely from pain medicine.  Over the counter generic miralax , one capful in glass of water once daily.  If no help within 2 days, increase to twice daily.  Also, his labs show liver labs and function are improved from last week.    I would like him to have a hepatic function panel and PT/INR next monday

## 2016-10-14 NOTE — Telephone Encounter (Signed)
Patient advised to start miralax once daily, if no help within 2 days to increase it to twice daily. Labs ordered and patient understands to come in on 1/15 to have them done. Also scheduled follow up visit with Dr. Myrtie Neitheranis on 1/30.

## 2016-10-17 ENCOUNTER — Other Ambulatory Visit: Payer: Medicare PPO

## 2016-10-17 ENCOUNTER — Other Ambulatory Visit: Payer: Self-pay

## 2016-10-17 DIAGNOSIS — B179 Acute viral hepatitis, unspecified: Secondary | ICD-10-CM

## 2016-10-17 MED ORDER — HYDROXYZINE HCL 25 MG PO TABS: 25.0000 mg | ORAL_TABLET | Freq: Three times a day (TID) | ORAL | 0 refills | Status: DC | PRN

## 2016-10-18 ENCOUNTER — Emergency Department (HOSPITAL_COMMUNITY)
Admission: EM | Admit: 2016-10-18 | Discharge: 2016-10-19 | Disposition: A | Discharge status: Home / Self Care | Payer: Medicare PPO | Attending: Emergency Medicine | Admitting: Emergency Medicine

## 2016-10-18 ENCOUNTER — Telehealth: Payer: Self-pay | Admitting: Internal Medicine

## 2016-10-18 ENCOUNTER — Encounter (HOSPITAL_COMMUNITY): Payer: Self-pay

## 2016-10-18 DIAGNOSIS — R1084 Generalized abdominal pain: Secondary | ICD-10-CM | POA: Insufficient documentation

## 2016-10-18 DIAGNOSIS — F17210 Nicotine dependence, cigarettes, uncomplicated: Secondary | ICD-10-CM | POA: Insufficient documentation

## 2016-10-18 DIAGNOSIS — Z23 Encounter for immunization: Secondary | ICD-10-CM | POA: Diagnosis not present

## 2016-10-18 DIAGNOSIS — Z79899 Other long term (current) drug therapy: Secondary | ICD-10-CM | POA: Insufficient documentation

## 2016-10-18 DIAGNOSIS — R109 Unspecified abdominal pain: Secondary | ICD-10-CM | POA: Diagnosis present

## 2016-10-18 HISTORY — DX: Hepatic failure, unspecified without coma: K72.90

## 2016-10-18 LAB — CBC
HEMATOCRIT: 38.1 % — AB (ref 39.0–52.0)
Hemoglobin: 13.5 g/dL (ref 13.0–17.0)
MCH: 30.8 pg (ref 26.0–34.0)
MCHC: 35.4 g/dL (ref 30.0–36.0)
MCV: 86.8 fL (ref 78.0–100.0)
Platelets: 338 10*3/uL (ref 150–400)
RBC: 4.39 MIL/uL (ref 4.22–5.81)
RDW: 18.5 % — AB (ref 11.5–15.5)
WBC: 9.5 10*3/uL (ref 4.0–10.5)

## 2016-10-18 LAB — COMPREHENSIVE METABOLIC PANEL
ALBUMIN: 2.9 g/dL — AB (ref 3.5–5.0)
ALT: 669 U/L — ABNORMAL HIGH (ref 17–63)
AST: 862 U/L — AB (ref 15–41)
Alkaline Phosphatase: 120 U/L (ref 38–126)
Anion gap: 12 (ref 5–15)
BILIRUBIN TOTAL: 15.8 mg/dL — AB (ref 0.3–1.2)
BUN: 7 mg/dL (ref 6–20)
CHLORIDE: 97 mmol/L — AB (ref 101–111)
CO2: 22 mmol/L (ref 22–32)
Calcium: 8.6 mg/dL — ABNORMAL LOW (ref 8.9–10.3)
Creatinine, Ser: 0.94 mg/dL (ref 0.61–1.24)
GFR calc Af Amer: >60 mL/min (ref >60)
GFR calc non Af Amer: >60 mL/min (ref >60)
GLUCOSE: 122 mg/dL — AB (ref 65–99)
POTASSIUM: 3.2 mmol/L — AB (ref 3.5–5.1)
Sodium: 131 mmol/L — ABNORMAL LOW (ref 135–145)
TOTAL PROTEIN: 6.6 g/dL (ref 6.5–8.1)

## 2016-10-18 LAB — URINALYSIS, ROUTINE W REFLEX MICROSCOPIC
GLUCOSE, UA: NEGATIVE mg/dL
HGB URINE DIPSTICK: NEGATIVE
KETONES UR: NEGATIVE mg/dL
Leukocytes, UA: NEGATIVE
Nitrite: NEGATIVE
Protein, ur: NEGATIVE mg/dL
Specific Gravity, Urine: 1.008 (ref 1.005–1.030)
pH: 6 (ref 5.0–8.0)

## 2016-10-18 LAB — LIPASE, BLOOD: LIPASE: 51 U/L (ref 11–51)

## 2016-10-18 MED ORDER — OXYCODONE HCL 5 MG PO TABS
15.0000 mg | ORAL_TABLET | Freq: Once | ORAL | Status: AC
  Administered 2016-10-18: 15 mg via ORAL
  Filled 2016-10-18: qty 3

## 2016-10-18 MED ORDER — OXYCODONE HCL 5 MG PO TABS: 5.0000 mg | ORAL_TABLET | ORAL | 0 refills | Status: DC | PRN

## 2016-10-18 MED ORDER — TETANUS-DIPHTH-ACELL PERTUSSIS 5-2.5-18.5 LF-MCG/0.5 IM SUSP
0.5000 mL | Freq: Once | INTRAMUSCULAR | Status: AC
  Administered 2016-10-18: 0.5 mL via INTRAMUSCULAR
  Filled 2016-10-18: qty 0.5

## 2016-10-18 NOTE — Discharge Instructions (Signed)
You can take the oxycodone for breakthrough pain.   Please follow with your primary care doctor in the next 2 days for a check-up. They must obtain records for further management.   Do not hesitate to return to the Emergency Department for any new, worsening or concerning symptoms.

## 2016-10-18 NOTE — Telephone Encounter (Signed)
Pt called to report he is struggling with pain, including abd pain. He is asking that I increase his oxycodone from 10 mg to 15 mg.  He ask that I call in a Rx.   I advised this was not possible he then asked if he could come pick a RX up. I informed him this was not possible without being seen and evaluated Options over the weekend are urgent care or ER. He reports he will likely present to the ER to have his pain addressed stating that "I cannot deal with this anymore".   I will alert Dr. Myrtie Neitheranis of the call
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
## 2016-10-18 NOTE — ED Notes (Signed)
Lab contacted to add on

## 2016-10-18 NOTE — ED Notes (Signed)
Lab contacted to add on protime 

## 2016-10-18 NOTE — ED Triage Notes (Signed)
Pt states that he was admitted two weeks ago for high liver enzymes, pt states that he has been in pain since leaving the hospital, pt is jaundice, c/o n/v.

## 2016-10-19 LAB — HIV-1 RNA ULTRAQUANT REFLEX TO GENTYP+: HIV1 RNA # SerPl PCR: <20 copies/mL

## 2016-10-21 ENCOUNTER — Telehealth: Payer: Self-pay | Admitting: Gastroenterology

## 2016-10-21 ENCOUNTER — Other Ambulatory Visit (INDEPENDENT_AMBULATORY_CARE_PROVIDER_SITE_OTHER): Payer: Medicare PPO

## 2016-10-21 ENCOUNTER — Other Ambulatory Visit: Payer: Self-pay

## 2016-10-21 DIAGNOSIS — B179 Acute viral hepatitis, unspecified: Secondary | ICD-10-CM | POA: Diagnosis not present

## 2016-10-21 LAB — HEPATIC FUNCTION PANEL
ALT: 414 U/L — ABNORMAL HIGH (ref 0–53)
AST: 410 U/L — ABNORMAL HIGH (ref 0–37)
Albumin: 3.5 g/dL (ref 3.5–5.2)
Alkaline Phosphatase: 134 U/L — ABNORMAL HIGH (ref 39–117)
BILIRUBIN DIRECT: 7.8 mg/dL — AB (ref 0.0–0.3)
BILIRUBIN TOTAL: 14 mg/dL — AB (ref 0.2–1.2)
Total Protein: 6.5 g/dL (ref 6.0–8.3)

## 2016-10-21 LAB — PROTIME-INR
INR: 1.1 ratio — ABNORMAL HIGH (ref 0.8–1.0)
PROTHROMBIN TIME: 12 s (ref 9.6–13.1)

## 2016-10-21 NOTE — Telephone Encounter (Addendum)
For the last time...we do not manage pain. My answer is no.

## 2016-10-21 NOTE — Telephone Encounter (Signed)
Left message for patient that Dr. Myrtie Neitheranis will not manage his pain, told him that he needs to contact his PCP.

## 2016-10-21 NOTE — Progress Notes (Signed)
Lab results mailed.

## 2016-10-21 NOTE — Telephone Encounter (Signed)
Looks like patient had recent ED visit for abdominal pain, also note from Dr. Rhea BeltonPyrtle. Patient is scheduled for follow up on 1/30 with you.

## 2016-10-27 ENCOUNTER — Telehealth: Payer: Self-pay | Admitting: Nurse Practitioner

## 2016-10-27 ENCOUNTER — Telehealth: Payer: Self-pay

## 2016-10-27 NOTE — Telephone Encounter (Signed)
Patient called about lab results. Let him know that I tried to reach him last week, finally mailed letter with results and follow up appointment information.

## 2016-10-27 NOTE — Telephone Encounter (Signed)
See lab results notes for additional details.  

## 2016-10-29 ENCOUNTER — Telehealth: Payer: Self-pay | Admitting: Nurse Practitioner

## 2016-10-30 ENCOUNTER — Other Ambulatory Visit: Payer: Self-pay | Admitting: Nurse Practitioner

## 2016-10-30 NOTE — Telephone Encounter (Signed)
Gunnar FusiPaula, please advise. His appointment with Dr Myrtie Neitheranis is 11/04/2016.

## 2016-10-31 ENCOUNTER — Other Ambulatory Visit: Payer: Self-pay

## 2016-10-31 MED ORDER — HYDROXYZINE HCL 25 MG PO TABS: 25.0000 mg | ORAL_TABLET | Freq: Three times a day (TID) | ORAL | 0 refills | Status: DC | PRN

## 2016-10-31 NOTE — Telephone Encounter (Signed)
Patient has a follow up appointment and has had his labs drawn. Patient notified. [facility] [address] [facility] [address] [facility]

## 2016-10-31 NOTE — Telephone Encounter (Signed)
Beth, a refill #20 is fine. If Dr. Myrtie Neitheranis didn't order LFTs prior to visit please make sure patient comes for them. Thanks

## 2016-11-04 ENCOUNTER — Ambulatory Visit (INDEPENDENT_AMBULATORY_CARE_PROVIDER_SITE_OTHER): Payer: Medicare PPO | Admitting: Gastroenterology

## 2016-11-04 ENCOUNTER — Encounter (INDEPENDENT_AMBULATORY_CARE_PROVIDER_SITE_OTHER): Payer: Self-pay

## 2016-11-04 ENCOUNTER — Encounter: Payer: Self-pay | Admitting: Gastroenterology

## 2016-11-04 ENCOUNTER — Other Ambulatory Visit (INDEPENDENT_AMBULATORY_CARE_PROVIDER_SITE_OTHER): Payer: Medicare PPO

## 2016-11-04 VITALS — BP 100/70 | HR 80 | Ht 69.0 in | Wt 183.2 lb

## 2016-11-04 DIAGNOSIS — R1011 Right upper quadrant pain: Secondary | ICD-10-CM

## 2016-11-04 DIAGNOSIS — B182 Chronic viral hepatitis C: Secondary | ICD-10-CM

## 2016-11-04 DIAGNOSIS — R17 Unspecified jaundice: Secondary | ICD-10-CM

## 2016-11-04 DIAGNOSIS — B169 Acute hepatitis B without delta-agent and without hepatic coma: Secondary | ICD-10-CM

## 2016-11-04 LAB — HEPATIC FUNCTION PANEL
ALT: 112 U/L — AB (ref 0–53)
AST: 123 U/L — ABNORMAL HIGH (ref 0–37)
Albumin: 4.1 g/dL (ref 3.5–5.2)
Alkaline Phosphatase: 142 U/L — ABNORMAL HIGH (ref 39–117)
BILIRUBIN TOTAL: 4.3 mg/dL — AB (ref 0.2–1.2)
Bilirubin, Direct: 2.3 mg/dL — ABNORMAL HIGH (ref 0.0–0.3)
Total Protein: 7.5 g/dL (ref 6.0–8.3)

## 2016-11-04 NOTE — Patient Instructions (Signed)
If you are age 42 or older, your body mass index should be between 23-30. Your Body mass index is 27.06 kg/m. If this is out of the aforementioned range listed, please consider follow up with your Primary Care Provider.  If you are age 42 or younger, your body mass index should be between 19-25. Your Body mass index is 27.06 kg/m. If this is out of the aformentioned range listed, please consider follow up with your Primary Care Provider.   Your physician has requested that you go to the basement for lab work before leaving today.  Thank you for choosing [facility]  Dr Amada JupiterHenry Danis III

## 2016-11-04 NOTE — Progress Notes (Signed)
[facility] Progress Note  Chief Complaint: Acute hepatitis B  Subjective  History:  This is a 41 year old man following up after hospital evaluation in late December. He was known to have chronic hepatitis C, but came in with cholestatic hepatitis and found to have both acute hepatitis A and acute hepatitis B. He had last used IV drugs in early October of this year. His bilirubin peaked at about 21 and his INR peaked at 1.3 about a week post hospital discharge. He was having severe itching that finally resolved. He also had ongoing upper abdominal pain and called our office multiple times requesting pain medicines, which were denied. History of chronic back pain and previous monitored opioid use for this. He still has some dull epigastric discomfort and reflux symptoms incompletely controlled on omeprazole. He denies dysphagia, vomiting or weight loss. He is in a new relationship and is not yet sexually active that individual. He asked if he was "contagious". I told him that he most likely still is, that his partner should be aware of his hepatitis status and the possibility of passing hepatitis this individual even with condom use. He had also previously worked at times in [employer] I told him he should not be doing that is still possibly contagious. He has not come back to work since his hospital discharge. He is actually disabled and on Medicare, but works part-time at [employer].  ROS: Cardiovascular:  no chest pain Respiratory: no dyspnea  The patient's Past Medical, Family and Social History were reviewed and are on file in the EMR.  Objective:  Med list reviewed  Vital signs in last 24 hrs: Vitals:   11/04/16 1104  BP: 100/70  Pulse: 80    Physical Exam    HEENT: sclera anicteric, oral mucosa moist without lesions  Neck: supple, no thyromegaly, JVD or lymphadenopathy  Cardiac: RRR without murmurs, S1S2 heard, no peripheral edema  Pulm: clear to  auscultation bilaterally, normal RR and effort noted  Abdomen: soft, No tenderness, with active bowel sounds. No guarding or palpable hepatosplenomegaly.  Skin; warm and dry, he still has mild jaundice and some healed excoriations on his chest and abdominal wall  Recent Labs:  CMP Latest Ref Rng & Units 11/04/2016 10/21/2016 10/18/2016  Glucose 65 - 99 mg/dL - - 122(H)  BUN 6 - 20 mg/dL - - 7  Creatinine 0.61 - 1.24 mg/dL - - 0.94  Sodium 135 - 145 mmol/L - - 131(L)  Potassium 3.5 - 5.1 mmol/L - - 3.2(L)  Chloride 101 - 111 mmol/L - - 97(L)  CO2 22 - 32 mmol/L - - 22  Calcium 8.9 - 10.3 mg/dL - - 8.6(L)  Total Protein 6.0 - 8.3 g/dL 7.5 6.5 6.6  Total Bilirubin 0.2 - 1.2 mg/dL 4.3(H) 14.0(H) 15.8(H)  Alkaline Phos 39 - 117 U/L 142(H) 134(H) 120  AST 0 - 37 U/L 123(H) 410(H) 862(H)  ALT 0 - 53 U/L 112(H) 414(H) 669(H)   Hepatic Function Latest Ref Rng & Units 11/04/2016 10/21/2016 10/18/2016  Total Protein 6.0 - 8.3 g/dL 7.5 6.5 6.6  Albumin 3.5 - 5.2 g/dL 4.1 3.5 2.9(L)  AST 0 - 37 U/L 123(H) 410(H) 862(H)  ALT 0 - 53 U/L 112(H) 414(H) 669(H)  Alk Phosphatase 39 - 117 U/L 142(H) 134(H) 120  Total Bilirubin 0.2 - 1.2 mg/dL 4.3(H) 14.0(H) 15.8(H)  Bilirubin, Direct 0.0 - 0.3 mg/dL 2.3(H) 7.8(H) -    INR 1.3 on 1/5, then 1.1 on 1/16  Hep panel pos a/acute b/chronic c  HIV Viral load undetected( there had been an initial false positive antigen or antibody test which we discussed with infectious disease)  Radiologic studies:  See ultrasound and CT scan from hospital stay in late December  @ASSESSMENTPLANBEGIN @ Assessment: Encounter Diagnoses  Name Primary?  . Acute hepatitis B   . Chronic hepatitis C without hepatic coma (HCC) Yes  . RUQ pain   . Jaundice    He is recovering from acute hepatitis B, unknown if it is E antigen negative or positive. He contracted from IV drug use: For which she has been abstinent for nearly 4 months.  Hepatitis C therapy must wait until we see  if his hepatitis B clears first, because if antiviral therapy for hepatitis B as necessary it may very well affect choice of medicines for HCV.  His custom natural history of hepatitis B, now most patients will eventually clear it but we must wait 6 months for the time of probable infection to see if that happens.   Plan: Updated LFTs today show that things are much improved. I will see him in clinic in 2-3 months. At that point we will do more extensive hepatitis B viral tests and consider a referral to infectious disease regarding hepatitis C.   Total time 30 minutes, over half spent in counseling and coordination of care.   Nelida Meuse III

## 2016-11-05 ENCOUNTER — Telehealth: Payer: Self-pay | Admitting: *Deleted

## 2016-11-05 NOTE — Telephone Encounter (Signed)
[facility].  We sent refill request on 10-30-2016. He picked it up on 11-01-2016.  We sent #20 with no refills. The pharmacy tech said this was most likely sent in error.  I advised her to tell the patient to call us if he has any questions or problems with this refill.

## 2016-11-06 LAB — HEPATITIS B SURFACE ANTIGEN: Hepatitis B Surface Ag: POSITIVE — AB

## 2016-11-06 LAB — HEPATITIS B SURFACE ANTIBODY,QUALITATIVE: HEP B S AB: NEGATIVE

## 2016-11-06 LAB — HEPATITIS B SURF AG CONFIRMATION: HEPATITIS B SURFACE ANTIGEN CONFIRMATION: POSITIVE — AB

## 2016-11-07 LAB — HEPATITIS C GENOTYPE

## 2016-11-12 LAB — HEPATITIS C RNA QUANTITATIVE
HCV QUANT: 479000 [IU]/mL — AB
HCV Quantitative Log: 5.68 Log IU/mL — ABNORMAL HIGH

## 2016-12-08 ENCOUNTER — Encounter (HOSPITAL_COMMUNITY): Payer: Self-pay | Admitting: Emergency Medicine

## 2016-12-08 ENCOUNTER — Emergency Department (HOSPITAL_COMMUNITY)
Admission: EM | Admit: 2016-12-08 | Discharge: 2016-12-08 | Disposition: A | Discharge status: Other Acute Inpatient Hospital | Payer: Medicare PPO | Attending: Emergency Medicine | Admitting: Emergency Medicine

## 2016-12-08 ENCOUNTER — Encounter (HOSPITAL_COMMUNITY): Payer: Self-pay

## 2016-12-08 ENCOUNTER — Inpatient Hospital Stay (HOSPITAL_COMMUNITY)
Admission: AD | Admit: 2016-12-08 | Discharge: 2016-12-12 | DRG: 885 | Payer: Medicare PPO | Source: Intra-hospital | Attending: Psychiatry | Admitting: Psychiatry

## 2016-12-08 DIAGNOSIS — Z833 Family history of diabetes mellitus: Secondary | ICD-10-CM

## 2016-12-08 DIAGNOSIS — F1123 Opioid dependence with withdrawal: Secondary | ICD-10-CM | POA: Diagnosis not present

## 2016-12-08 DIAGNOSIS — K219 Gastro-esophageal reflux disease without esophagitis: Secondary | ICD-10-CM | POA: Diagnosis present

## 2016-12-08 DIAGNOSIS — F1420 Cocaine dependence, uncomplicated: Secondary | ICD-10-CM | POA: Insufficient documentation

## 2016-12-08 DIAGNOSIS — Z79899 Other long term (current) drug therapy: Secondary | ICD-10-CM | POA: Diagnosis not present

## 2016-12-08 DIAGNOSIS — Z590 Homelessness: Secondary | ICD-10-CM

## 2016-12-08 DIAGNOSIS — F17210 Nicotine dependence, cigarettes, uncomplicated: Secondary | ICD-10-CM | POA: Diagnosis present

## 2016-12-08 DIAGNOSIS — F1120 Opioid dependence, uncomplicated: Secondary | ICD-10-CM | POA: Diagnosis not present

## 2016-12-08 DIAGNOSIS — F1124 Opioid dependence with opioid-induced mood disorder: Secondary | ICD-10-CM | POA: Diagnosis present

## 2016-12-08 DIAGNOSIS — Z818 Family history of other mental and behavioral disorders: Secondary | ICD-10-CM | POA: Diagnosis not present

## 2016-12-08 DIAGNOSIS — R45851 Suicidal ideations: Secondary | ICD-10-CM | POA: Diagnosis present

## 2016-12-08 DIAGNOSIS — Z915 Personal history of self-harm: Secondary | ICD-10-CM | POA: Diagnosis not present

## 2016-12-08 DIAGNOSIS — R05 Cough: Secondary | ICD-10-CM

## 2016-12-08 DIAGNOSIS — G4700 Insomnia, unspecified: Secondary | ICD-10-CM | POA: Diagnosis present

## 2016-12-08 DIAGNOSIS — Z8249 Family history of ischemic heart disease and other diseases of the circulatory system: Secondary | ICD-10-CM | POA: Diagnosis not present

## 2016-12-08 DIAGNOSIS — Z880 Allergy status to penicillin: Secondary | ICD-10-CM | POA: Diagnosis not present

## 2016-12-08 DIAGNOSIS — F341 Dysthymic disorder: Secondary | ICD-10-CM | POA: Diagnosis present

## 2016-12-08 DIAGNOSIS — B1920 Unspecified viral hepatitis C without hepatic coma: Secondary | ICD-10-CM | POA: Diagnosis present

## 2016-12-08 DIAGNOSIS — Z820 Family history of epilepsy and other diseases of the nervous system: Secondary | ICD-10-CM

## 2016-12-08 DIAGNOSIS — F332 Major depressive disorder, recurrent severe without psychotic features: Secondary | ICD-10-CM | POA: Insufficient documentation

## 2016-12-08 DIAGNOSIS — Z888 Allergy status to other drugs, medicaments and biological substances status: Secondary | ICD-10-CM | POA: Diagnosis not present

## 2016-12-08 DIAGNOSIS — Z8711 Personal history of peptic ulcer disease: Secondary | ICD-10-CM

## 2016-12-08 DIAGNOSIS — L304 Erythema intertrigo: Secondary | ICD-10-CM | POA: Diagnosis present

## 2016-12-08 DIAGNOSIS — Z8269 Family history of other diseases of the musculoskeletal system and connective tissue: Secondary | ICD-10-CM

## 2016-12-08 DIAGNOSIS — Z945 Skin transplant status: Secondary | ICD-10-CM

## 2016-12-08 DIAGNOSIS — Z800 Family history of malignant neoplasm of digestive organs: Secondary | ICD-10-CM | POA: Diagnosis not present

## 2016-12-08 DIAGNOSIS — F339 Major depressive disorder, recurrent, unspecified: Secondary | ICD-10-CM

## 2016-12-08 DIAGNOSIS — G8929 Other chronic pain: Secondary | ICD-10-CM | POA: Diagnosis present

## 2016-12-08 DIAGNOSIS — R059 Cough, unspecified: Secondary | ICD-10-CM

## 2016-12-08 DIAGNOSIS — F1910 Other psychoactive substance abuse, uncomplicated: Secondary | ICD-10-CM

## 2016-12-08 DIAGNOSIS — F419 Anxiety disorder, unspecified: Secondary | ICD-10-CM | POA: Diagnosis present

## 2016-12-08 DIAGNOSIS — F1994 Other psychoactive substance use, unspecified with psychoactive substance-induced mood disorder: Secondary | ICD-10-CM | POA: Diagnosis not present

## 2016-12-08 LAB — RAPID URINE DRUG SCREEN, HOSP PERFORMED
Amphetamines: NOT DETECTED
BARBITURATES: NOT DETECTED
Benzodiazepines: NOT DETECTED
COCAINE: POSITIVE — AB
Opiates: POSITIVE — AB
Tetrahydrocannabinol: NOT DETECTED

## 2016-12-08 LAB — COMPREHENSIVE METABOLIC PANEL
ALT: 46 U/L (ref 17–63)
AST: 40 U/L (ref 15–41)
Albumin: 4.3 g/dL (ref 3.5–5.0)
Alkaline Phosphatase: 97 U/L (ref 38–126)
Anion gap: 9 (ref 5–15)
BILIRUBIN TOTAL: 1.4 mg/dL — AB (ref 0.3–1.2)
BUN: 15 mg/dL (ref 6–20)
CO2: 21 mmol/L — ABNORMAL LOW (ref 22–32)
CREATININE: 0.83 mg/dL (ref 0.61–1.24)
Calcium: 8.8 mg/dL — ABNORMAL LOW (ref 8.9–10.3)
Chloride: 105 mmol/L (ref 101–111)
GFR calc Af Amer: >60 mL/min (ref >60)
GLUCOSE: 134 mg/dL — AB (ref 65–99)
Potassium: 3 mmol/L — ABNORMAL LOW (ref 3.5–5.1)
Sodium: 135 mmol/L (ref 135–145)
TOTAL PROTEIN: 7.7 g/dL (ref 6.5–8.1)

## 2016-12-08 LAB — CBC
HCT: 41.1 % (ref 39.0–52.0)
Hemoglobin: 14.6 g/dL (ref 13.0–17.0)
MCH: 33.3 pg (ref 26.0–34.0)
MCHC: 35.5 g/dL (ref 30.0–36.0)
MCV: 93.6 fL (ref 78.0–100.0)
Platelets: 302 10*3/uL (ref 150–400)
RBC: 4.39 MIL/uL (ref 4.22–5.81)
RDW: 13.7 % (ref 11.5–15.5)
WBC: 13.3 10*3/uL — ABNORMAL HIGH (ref 4.0–10.5)

## 2016-12-08 LAB — ETHANOL: Alcohol, Ethyl (B): <5 mg/dL (ref <5)

## 2016-12-08 MED ORDER — LOPERAMIDE HCL 2 MG PO CAPS
2.0000 mg | ORAL_CAPSULE | ORAL | Status: DC | PRN
  Administered 2016-12-09: 4 mg via ORAL
  Filled 2016-12-08: qty 2

## 2016-12-08 MED ORDER — NICOTINE 21 MG/24HR TD PT24
21.0000 mg | MEDICATED_PATCH | Freq: Every day | TRANSDERMAL | Status: DC
  Administered 2016-12-09 – 2016-12-11 (×3): 21 mg via TRANSDERMAL
  Filled 2016-12-08 (×6): qty 1

## 2016-12-08 MED ORDER — ACETAMINOPHEN 325 MG PO TABS: 650.0000 mg | ORAL_TABLET | Freq: Four times a day (QID) | ORAL | Status: DC | PRN

## 2016-12-08 MED ORDER — NICOTINE 21 MG/24HR TD PT24
21.0000 mg | MEDICATED_PATCH | Freq: Every day | TRANSDERMAL | Status: DC
  Administered 2016-12-08: 21 mg via TRANSDERMAL
  Filled 2016-12-08: qty 1

## 2016-12-08 MED ORDER — IBUPROFEN 200 MG PO TABS: 600.0000 mg | ORAL_TABLET | Freq: Three times a day (TID) | ORAL | Status: DC | PRN

## 2016-12-08 MED ORDER — LOPERAMIDE HCL 2 MG PO CAPS: 2.0000 mg | ORAL_CAPSULE | ORAL | Status: DC | PRN

## 2016-12-08 MED ORDER — METHOCARBAMOL 500 MG PO TABS
500.0000 mg | ORAL_TABLET | Freq: Three times a day (TID) | ORAL | Status: DC | PRN
  Administered 2016-12-08 – 2016-12-09 (×3): 500 mg via ORAL
  Filled 2016-12-08 (×3): qty 1

## 2016-12-08 MED ORDER — METHOCARBAMOL 500 MG PO TABS: 500.0000 mg | ORAL_TABLET | Freq: Three times a day (TID) | ORAL | Status: DC | PRN

## 2016-12-08 MED ORDER — MAGNESIUM HYDROXIDE 400 MG/5ML PO SUSP: 30.0000 mL | Freq: Every day | ORAL | Status: DC | PRN

## 2016-12-08 MED ORDER — DICYCLOMINE HCL 20 MG PO TABS
20.0000 mg | ORAL_TABLET | Freq: Four times a day (QID) | ORAL | Status: DC | PRN
  Administered 2016-12-08: 20 mg via ORAL
  Filled 2016-12-08 (×2): qty 1

## 2016-12-08 MED ORDER — CLONIDINE HCL 0.1 MG PO TABS: 0.1000 mg | ORAL_TABLET | ORAL | Status: DC

## 2016-12-08 MED ORDER — NAPROXEN 500 MG PO TABS: 500.0000 mg | ORAL_TABLET | Freq: Two times a day (BID) | ORAL | Status: DC | PRN

## 2016-12-08 MED ORDER — ALUM & MAG HYDROXIDE-SIMETH 200-200-20 MG/5ML PO SUSP: 30.0000 mL | ORAL | Status: DC | PRN

## 2016-12-08 MED ORDER — POTASSIUM CHLORIDE CRYS ER 20 MEQ PO TBCR
40.0000 meq | EXTENDED_RELEASE_TABLET | Freq: Once | ORAL | Status: AC
  Administered 2016-12-08: 40 meq via ORAL
  Filled 2016-12-08: qty 2

## 2016-12-08 MED ORDER — CLONIDINE HCL 0.1 MG PO TABS
0.1000 mg | ORAL_TABLET | Freq: Every day | ORAL | Status: DC
  Filled 2016-12-08 (×2): qty 1

## 2016-12-08 MED ORDER — ONDANSETRON 4 MG PO TBDP: 4.0000 mg | ORAL_TABLET | Freq: Four times a day (QID) | ORAL | Status: DC | PRN

## 2016-12-08 MED ORDER — HYDROXYZINE HCL 25 MG PO TABS
25.0000 mg | ORAL_TABLET | Freq: Four times a day (QID) | ORAL | Status: DC | PRN
  Administered 2016-12-08: 25 mg via ORAL
  Filled 2016-12-08: qty 1

## 2016-12-08 MED ORDER — ONDANSETRON HCL 4 MG PO TABS: 4.0000 mg | ORAL_TABLET | Freq: Three times a day (TID) | ORAL | Status: DC | PRN

## 2016-12-08 MED ORDER — IBUPROFEN 200 MG PO TABS
600.0000 mg | ORAL_TABLET | Freq: Once | ORAL | Status: AC
  Administered 2016-12-08: 600 mg via ORAL
  Filled 2016-12-08: qty 3

## 2016-12-08 MED ORDER — LORAZEPAM 1 MG PO TABS
1.0000 mg | ORAL_TABLET | Freq: Three times a day (TID) | ORAL | Status: DC | PRN
Start: 2016-12-08 — End: 2016-12-08
  Administered 2016-12-08: 1 mg via ORAL
  Filled 2016-12-08: qty 1

## 2016-12-08 MED ORDER — CLONIDINE HCL 0.1 MG PO TABS
0.1000 mg | ORAL_TABLET | Freq: Four times a day (QID) | ORAL | Status: AC
  Administered 2016-12-09 – 2016-12-10 (×8): 0.1 mg via ORAL
  Filled 2016-12-08 (×11): qty 1

## 2016-12-08 MED ORDER — HYDROXYZINE HCL 25 MG PO TABS
25.0000 mg | ORAL_TABLET | Freq: Four times a day (QID) | ORAL | Status: DC | PRN
  Administered 2016-12-09 – 2016-12-11 (×4): 25 mg via ORAL
  Filled 2016-12-08: qty 30
  Filled 2016-12-08 (×7): qty 1

## 2016-12-08 MED ORDER — DICYCLOMINE HCL 20 MG PO TABS: 20.0000 mg | ORAL_TABLET | Freq: Four times a day (QID) | ORAL | Status: DC | PRN

## 2016-12-08 MED ORDER — CLONIDINE HCL 0.1 MG PO TABS
0.1000 mg | ORAL_TABLET | Freq: Four times a day (QID) | ORAL | Status: DC
  Administered 2016-12-08: 0.1 mg via ORAL
  Filled 2016-12-08: qty 1

## 2016-12-08 MED ORDER — CLONIDINE HCL 0.1 MG PO TABS
0.1000 mg | ORAL_TABLET | ORAL | Status: DC
  Administered 2016-12-11 – 2016-12-12 (×3): 0.1 mg via ORAL
  Filled 2016-12-08 (×5): qty 1

## 2016-12-08 MED ORDER — CLONIDINE HCL 0.1 MG PO TABS: 0.1000 mg | ORAL_TABLET | Freq: Every day | ORAL | Status: DC

## 2016-12-08 NOTE — Progress Notes (Signed)
12/08/16 1355:  LRT went to pt room to offer activities, pt was sleep.  Caroll RancherMarjette Brooklinn Longbottom, LRT/CTRS
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
## 2016-12-08 NOTE — Progress Notes (Addendum)
Patient ID: Scarlette ShortsLarry H Irion, male   DOB: [birth year], 41 y.o.   MRN: [id number] D: Client reports during medication follow up "cramps not as bad" Client is somnolent stays in bed this shift, respirations even, unlabored, no distress noted. A: Writer continued to assess frequently during the shift, urged client to call for staff if need. Staff will monitor [id number] for safety. R: Client is safe on the unit, did not attend group.

## 2016-12-08 NOTE — ED Notes (Signed)
Pt discharged ambulatory with [place] driver.  All belongings sent with pt.
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
## 2016-12-08 NOTE — BH Assessment (Signed)
[facility] Assessment Progress Note  Per Thedore MinsMojeed Akintayo, MD, this pt requires psychiatric hospitalization at this time.  Berneice Heinrichina Tate, RN, [facility]  Pt has signed Voluntary Admission and Consent for Treatment, as well as Consent to Release Information to no one, and [facility].  Pt's nurse, Kendal Hymendie, has been notified, and agrees to send original paperwork along with pt via [facility], and to call report to [phone].  Doylene Canninghomas Sandrea Boer, MA Triage Specialist [phone]

## 2016-12-08 NOTE — BH Assessment (Signed)
[facility] Assessment Progress Note Case was staffed with Lord DNP who recommended patient be admitted inpatient as appropriate bed placement is investigated      

## 2016-12-08 NOTE — ED Notes (Signed)
David with TTS at bedside 

## 2016-12-08 NOTE — ED Notes (Signed)
Pt oriented to room and unit.  Pt is shaky and anxious.  Pt states he S/I without a plan and contracts for safety.  15 minute checks and video monitoring in place.

## 2016-12-08 NOTE — Tx Team (Signed)
Initial Treatment Plan 12/08/2016 6:07 PM [name] HimLarry H Baham [id number]    PATIENT STRESSORS: Financial difficulties Health problems Marital or family conflict   PATIENT STRENGTHS: Barrister's clerkCommunication skills Motivation for treatment/growth Supportive family/friends   PATIENT IDENTIFIED PROBLEMS: "I want to go to a long-term treatment facility."  "I want to talk to a counselor about my depression."                   DISCHARGE CRITERIA:  Improved stabilization in mood, thinking, and/or behavior  PRELIMINARY DISCHARGE PLAN: Outpatient therapy  PATIENT/FAMILY INVOLVEMENT: This treatment plan has been presented to and reviewed with the patient, [name].  The patient and family have been given the opportunity to ask questions and make suggestions.  Maurine SimmeringShugart, Jahniya Duzan M, RN 12/08/2016, 6:07 PM

## 2016-12-08 NOTE — Progress Notes (Signed)
Patient did not attend wrap-up group he was sleeping. 

## 2016-12-08 NOTE — ED Notes (Signed)
Patient requesting something for pain. Made Josh PA aware. New orders to be placed.

## 2016-12-08 NOTE — ED Notes (Signed)
Pt pocket knife secured by security guard

## 2016-12-08 NOTE — ED Triage Notes (Signed)
Pt reports that he wants to be admitted for detox from cocaine and heroine. Pt states last use was around 0000 on 12/08/16. Pt currently denies SI/HI. Pt reports that last treatment for drugs was in October 2017

## 2016-12-08 NOTE — ED Notes (Signed)
Unable to collect labs PA is in the room 

## 2016-12-08 NOTE — ED Notes (Signed)
Bed: WTR5 Expected date:  Expected time:  Means of arrival:  Comments: 

## 2016-12-09 ENCOUNTER — Ambulatory Visit (HOSPITAL_COMMUNITY): Payer: Medicare PPO

## 2016-12-09 DIAGNOSIS — Z818 Family history of other mental and behavioral disorders: Secondary | ICD-10-CM

## 2016-12-09 DIAGNOSIS — F1123 Opioid dependence with withdrawal: Secondary | ICD-10-CM

## 2016-12-09 DIAGNOSIS — R45851 Suicidal ideations: Secondary | ICD-10-CM

## 2016-12-09 DIAGNOSIS — F1994 Other psychoactive substance use, unspecified with psychoactive substance-induced mood disorder: Secondary | ICD-10-CM

## 2016-12-09 DIAGNOSIS — F332 Major depressive disorder, recurrent severe without psychotic features: Principal | ICD-10-CM

## 2016-12-09 DIAGNOSIS — Z880 Allergy status to penicillin: Secondary | ICD-10-CM

## 2016-12-09 DIAGNOSIS — Z888 Allergy status to other drugs, medicaments and biological substances status: Secondary | ICD-10-CM

## 2016-12-09 DIAGNOSIS — Z79899 Other long term (current) drug therapy: Secondary | ICD-10-CM

## 2016-12-09 MED ORDER — IBUPROFEN 600 MG PO TABS
600.0000 mg | ORAL_TABLET | Freq: Four times a day (QID) | ORAL | Status: DC | PRN
  Administered 2016-12-10 – 2016-12-12 (×5): 600 mg via ORAL
  Filled 2016-12-09 (×5): qty 1
  Filled 2016-12-09: qty 20
  Filled 2016-12-09: qty 1

## 2016-12-09 MED ORDER — PANTOPRAZOLE SODIUM 40 MG PO TBEC
40.0000 mg | DELAYED_RELEASE_TABLET | Freq: Every day | ORAL | Status: DC
  Administered 2016-12-10 – 2016-12-12 (×3): 40 mg via ORAL
  Filled 2016-12-09 (×6): qty 1

## 2016-12-09 MED ORDER — TRAZODONE HCL 50 MG PO TABS
50.0000 mg | ORAL_TABLET | Freq: Every evening | ORAL | Status: DC | PRN
  Administered 2016-12-09: 50 mg via ORAL
  Filled 2016-12-09 (×5): qty 1

## 2016-12-09 MED ORDER — PREGABALIN 100 MG PO CAPS
200.0000 mg | ORAL_CAPSULE | Freq: Two times a day (BID) | ORAL | Status: DC
  Administered 2016-12-09 – 2016-12-12 (×6): 200 mg via ORAL
  Filled 2016-12-09 (×7): qty 2

## 2016-12-09 MED ORDER — LORAZEPAM 1 MG PO TABS
1.0000 mg | ORAL_TABLET | Freq: Four times a day (QID) | ORAL | Status: DC | PRN
  Administered 2016-12-10 – 2016-12-11 (×5): 1 mg via ORAL
  Filled 2016-12-09 (×5): qty 1

## 2016-12-09 NOTE — Progress Notes (Signed)
D: Pt presents with flat affect and depressed mood. Pt reported feeling anxious this morning. Pt reported withdrawal symptoms of sweats, shakes, loose stools, spasms and nausea. Pt given meds for withdrawal symptoms per protocol. Pt denies suicidal thoughts and verbally contracts for safety. A: Medications reviewed with pt. Medications administered as ordered per MD. Verbal support provided. Pt encouraged to attend groups. 15 minute checks performed for safety.  R: Pt compliant with tx.

## 2016-12-09 NOTE — Tx Team (Signed)
Interdisciplinary Treatment and Diagnostic Plan Update  12/09/2016 Time of Session: 0930 [name] MRN: [id number]  Principal Diagnosis: Polysubstance abuse severe  Secondary Diagnoses: Active Problems:   Major depressive disorder, recurrent severe without psychotic features (HCC)   Current Medications:  Current Facility-Administered Medications  Medication Dose Route Frequency Provider Last Rate Last Dose  . acetaminophen (TYLENOL) tablet 650 mg  650 mg Oral Q6H PRN Charm RingsJamison Y Lord, NP      . alum & mag hydroxide-simeth (MAALOX/MYLANTA) 200-200-20 MG/5ML suspension 30 mL  30 mL Oral PRN Charm RingsJamison Y Lord, NP      . cloNIDine (CATAPRES) tablet 0.1 mg  0.1 mg Oral QID Charm RingsJamison Y Lord, NP   0.1 mg at 12/09/16 1124   Followed by  . [START ON 12/11/2016] cloNIDine (CATAPRES) tablet 0.1 mg  0.1 mg Oral BH-qamhs Charm RingsJamison Y Lord, NP       Followed by  . [START ON 12/13/2016] cloNIDine (CATAPRES) tablet 0.1 mg  0.1 mg Oral QAC breakfast Charm RingsJamison Y Lord, NP      . dicyclomine (BENTYL) tablet 20 mg  20 mg Oral Q6H PRN Charm RingsJamison Y Lord, NP   20 mg at 12/08/16 1839  . hydrOXYzine (ATARAX/VISTARIL) tablet 25 mg  25 mg Oral Q6H PRN Charm RingsJamison Y Lord, NP   25 mg at 12/09/16 0800  . loperamide (IMODIUM) capsule 2-4 mg  2-4 mg Oral PRN Charm RingsJamison Y Lord, NP   4 mg at 12/09/16 0801  . LORazepam (ATIVAN) tablet 1 mg  1 mg Oral Q6H PRN Antonieta PertGreg Lawson Clary, MD      . magnesium hydroxide (MILK OF MAGNESIA) suspension 30 mL  30 mL Oral Daily PRN Charm RingsJamison Y Lord, NP      . methocarbamol (ROBAXIN) tablet 500 mg  500 mg Oral Q8H PRN Charm RingsJamison Y Lord, NP   500 mg at 12/09/16 0801  . nicotine (NICODERM CQ - dosed in mg/24 hours) patch 21 mg  21 mg Transdermal Daily Charm RingsJamison Y Lord, NP   21 mg at 12/09/16 0801  . pregabalin (LYRICA) capsule 200 mg  200 mg Oral BID Antonieta PertGreg Lawson Clary, MD       PTA Medications: Prescriptions Prior to Admission  Medication Sig Dispense Refill Last Dose  . amitriptyline (ELAVIL) 100 MG tablet Take 1 tablet  (100 mg total) by mouth at bedtime. 30 tablet 0 Past Week at Unknown time  . baclofen (LIORESAL) 10 MG tablet Take 10 mg by mouth 3 (three) times daily.   Past Week at Unknown time  . esomeprazole (NEXIUM) 40 MG capsule Take 40 mg by mouth daily.    12/07/2016 at Unknown time  . hydrOXYzine (ATARAX/VISTARIL) 25 MG tablet Take 1 tablet (25 mg total) by mouth every 8 (eight) hours as needed for itching. 20 tablet 0 Past Week at Unknown time  . NON FORMULARY BIO CLEANSE 2 TABLETS BID   Past Week at Unknown time  . NON FORMULARY DRINKS VINEGAR/ GINGER/ WATER 32 OZ DAILY   Past Week at Unknown time  . pregabalin (LYRICA) 300 MG capsule Take 300 mg by mouth 2 (two) times daily.   Past Week at Unknown time    Patient Stressors: Financial difficulties Health problems Marital or family conflict  Patient Strengths: Barrister's clerkCommunication skills Motivation for treatment/growth Supportive family/friends  Treatment Modalities: Medication Management, Group therapy, Case management,  1 to 1 session with clinician, Psychoeducation, Recreational therapy.   Physician Treatment Plan for Primary Diagnosis: Polysubstance abuse severe  Long Term Goal(s): Improvement  in symptoms so as ready for discharge Improvement in symptoms so as ready for discharge   Short Term Goals: Ability to identify changes in lifestyle to reduce recurrence of condition will improve Ability to verbalize feelings will improve Ability to disclose and discuss suicidal ideas Ability to demonstrate self-control will improve Ability to identify and develop effective coping behaviors will improve Ability to maintain clinical measurements within normal limits will improve Compliance with prescribed medications will improve Ability to identify triggers associated with substance abuse/mental health issues will improve Ability to identify changes in lifestyle to reduce recurrence of condition will improve Ability to verbalize feelings will  improve Ability to disclose and discuss suicidal ideas Ability to demonstrate self-control will improve Ability to identify and develop effective coping behaviors will improve Ability to maintain clinical measurements within normal limits will improve Compliance with prescribed medications will improve Ability to identify triggers associated with substance abuse/mental health issues will improve  Medication Management: Evaluate patient's response, side effects, and tolerance of medication regimen.  Therapeutic Interventions: 1 to 1 sessions, Unit Group sessions and Medication administration.  Evaluation of Outcomes: Progressing  Physician Treatment Plan for Secondary Diagnosis: Active Problems:   Major depressive disorder, recurrent severe without psychotic features (HCC)  Long Term Goal(s): Improvement in symptoms so as ready for discharge Improvement in symptoms so as ready for discharge   Short Term Goals: Ability to identify changes in lifestyle to reduce recurrence of condition will improve Ability to verbalize feelings will improve Ability to disclose and discuss suicidal ideas Ability to demonstrate self-control will improve Ability to identify and develop effective coping behaviors will improve Ability to maintain clinical measurements within normal limits will improve Compliance with prescribed medications will improve Ability to identify triggers associated with substance abuse/mental health issues will improve Ability to identify changes in lifestyle to reduce recurrence of condition will improve Ability to verbalize feelings will improve Ability to disclose and discuss suicidal ideas Ability to demonstrate self-control will improve Ability to identify and develop effective coping behaviors will improve Ability to maintain clinical measurements within normal limits will improve Compliance with prescribed medications will improve Ability to identify triggers associated  with substance abuse/mental health issues will improve     Medication Management: Evaluate patient's response, side effects, and tolerance of medication regimen.  Therapeutic Interventions: 1 to 1 sessions, Unit Group sessions and Medication administration.  Evaluation of Outcomes: Progressing   RN Treatment Plan for Primary Diagnosis:Polysubstance abuse severe  Long Term Goal(s): Knowledge of disease and therapeutic regimen to maintain health will improve  Short Term Goals: Ability to remain free from injury will improve, Ability to disclose and discuss suicidal ideas and Ability to identify and develop effective coping behaviors will improve  Medication Management: RN will administer medications as ordered by provider, will assess and evaluate patient's response and provide education to patient for prescribed medication. RN will report any adverse and/or side effects to prescribing provider.  Therapeutic Interventions: 1 on 1 counseling sessions, Psychoeducation, Medication administration, Evaluate responses to treatment, Monitor vital signs and CBGs as ordered, Perform/monitor CIWA, COWS, AIMS and Fall Risk screenings as ordered, Perform wound care treatments as ordered.  Evaluation of Outcomes: Progressing   LCSW Treatment Plan for Primary Diagnosis: Polysubstance abuse severe  Long Term Goal(s): Safe transition to appropriate next level of care at discharge, Engage patient in therapeutic group addressing interpersonal concerns.  Short Term Goals: Engage patient in aftercare planning with referrals and resources, Facilitate patient progression through stages of change regarding  substance use diagnoses and concerns and Identify triggers associated with mental health/substance abuse issues  Therapeutic Interventions: Assess for all discharge needs, 1 to 1 time with Social worker, Explore available resources and support systems, Assess for adequacy in community support network, Educate  family and significant other(s) on suicide prevention, Complete Psychosocial Assessment, Interpersonal group therapy.  Evaluation of Outcomes: Progressing   Progress in Treatment: Attending groups: No. New to unit. Continuing to assess.  Participating in groups: No. Taking medication as prescribed: Yes. Toleration medication: Yes. Family/Significant other contact made: No, will contact:  family member if patient consents Patient understands diagnosis: Yes. Discussing patient identified problems/goals with staff: Yes. Medical problems stabilized or resolved: Yes. Denies suicidal/homicidal ideation: No. Passive SI/Able to contract for safety on the unit.  Issues/concerns per patient self-inventory: No. Other: n/a   New problem(s) identified: No, Describe:  n/a  New Short Term/Long Term Goal(s): Detox; medication stabilization; development of comprehensive mental wellness/sobriety plan.   Discharge Plan or Barriers: CSW assessing for appropriate referrals. Pt sees Dr. Yetta Flock PCP for medication management but cannot remember medication regimen. CSW assessing for additional referrals. During his last admission at [facility] 07/09/16--pt was referred to [facility] in [place] but has [place] address currently.   Reason for Continuation of Hospitalization: Depression Medication stabilization Suicidal ideation Withdrawal symptoms  Estimated Length of Stay: 3-5 days   Attendees: Patient: 12/09/2016 2:46 PM  Physician: Dr. Jola Babinski MD 12/09/2016 2:46 PM  Nursing: Purvis Kilts RN 12/09/2016 2:46 PM  RN Care Manager: Onnie Boer CM 12/09/2016 2:46 PM  Social Worker: Chartered loss adjuster, LCSW; Donnelly Stager LCSWA 12/09/2016 2:46 PM  Recreational Therapist: Juliann Pares 12/09/2016 2:46 PM  Other: Armandina Stammer NP; Gray Bernhardt NP 12/09/2016 2:46 PM  Other:  12/09/2016 2:46 PM  Other: 12/09/2016 2:46 PM    Scribe for Treatment Team: Ledell Peoples Smart, LCSW 12/09/2016 2:46 PM

## 2016-12-09 NOTE — Progress Notes (Signed)
Patient ID: [name], male   DOB: 04/28/1976, 41 y.o.   MRN: [id number] D: Client visible on the unit, seen in dayroom interacting with peers and watching TV. Client is agitated this shift concerned that Elavil and Omeprazole was not ordered, says he takes these medications at home. Client reports depression "8" and anxiety "7" of 10. A: Writer provided emotional support encouraged client to speak with physician in the morning as other medications have been ordered for sleep and Mylanta was available for heartburn if needed until home medications can be verified. Medications reviewed, administered as ordered. Staff will monitor q7115min for safety. R: Client is safe on unit, attended group.

## 2016-12-09 NOTE — Plan of Care (Signed)
Problem: Safety: Goal: Periods of time without injury will increase Outcome: Progressing Periods of time without injury will increase AEB q15min safety checks.   
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
## 2016-12-09 NOTE — Progress Notes (Signed)
Pt requested Elavil for sleep tonight. Writer spoke with Renata Capriceonrad, NP. Per verbal order, Trazodone 50 mg po at bedtime with a repeat if needed. Per NP., pt will have to consult with MD for an order for Elavil. Writer informed pt. Writer called [facility] to verify Elavil prescription. Per pharmacy tech, there's no prescription on file for Elavil.

## 2016-12-09 NOTE — Progress Notes (Signed)
PT Cancellation Note  Patient Details Name: [name] MRN: [id number] DOB: 09-Feb-1976   Cancelled Treatment:    Reason Eval/Treat Not Completed: PT screened, no needs identified, will sign off. RN in room with PT who reports that he does not require a cane at this time.     Rada HayHill, [facility] [employer] [facility] 12/09/2016, 4:26 PM Blanchard KelchKaren Katrina Brosh PT [phone]

## 2016-12-09 NOTE — Progress Notes (Signed)
Recreation Therapy Notes  Animal-Assisted Activity (AAA) Program Checklist/Progress Notes Patient Eligibility Criteria Checklist & Daily Group note for Rec TxIntervention  Date: 03.06.2018 Time: 2:50pm [address]    AAA/T Program Assumption of Risk Form signed by Patient/ or Parent Legal Guardian Yes  Patient is free of allergies or sever asthma Yes  Patient reports no fear of animals Yes  Patient reports no history of cruelty to animals Yes  Patient understands his/her participation is voluntary Yes  Behavioral Response: Did not attend.   Marykay Lexenise L Shella Lahman, LRT/CTRS          Tamre Cass L 12/09/2016 3:02 PM

## 2016-12-09 NOTE — [facility] Group Notes (Signed)
[facility] Group Notes:  (Nursing/MHT/Case Management/Adjunct)  Date:  12/09/2016  Time:  0845 am  Type of Therapy:  Psychoeducational Skills  Participation Level:  Did Not Attend  Patient invited; declined to attend.  Cranford MonBeaudry, Ishaq Maffei Evans 12/09/2016, 10:42 AM
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
## 2016-12-09 NOTE — BHH Group Notes (Signed)
BHH LCSW Group Therapy  12/09/2016 2:46 PM  Type of Therapy:  Group Therapy  Participation Level:  Did Not Attend-pt invited. Chose to remain in bed.   Summary of Progress/Problems: MHA Speaker came to talk about his personal journey with substance abuse and addiction. The pt processed ways by which to relate to the speaker. MHA speaker provided handouts and educational information pertaining to groups and services offered by the [facility].   Edwina Grossberg N Smart LCSW 12/09/2016, 2:46 PM

## 2016-12-10 ENCOUNTER — Encounter (HOSPITAL_COMMUNITY): Payer: Self-pay | Admitting: Psychiatry

## 2016-12-10 LAB — BASIC METABOLIC PANEL
Anion gap: 6 (ref 5–15)
BUN: 14 mg/dL (ref 6–20)
CHLORIDE: 109 mmol/L (ref 101–111)
CO2: 25 mmol/L (ref 22–32)
CREATININE: 0.99 mg/dL (ref 0.61–1.24)
Calcium: 9 mg/dL (ref 8.9–10.3)
GFR calc Af Amer: >60 mL/min (ref >60)
GFR calc non Af Amer: >60 mL/min (ref >60)
GLUCOSE: 101 mg/dL — AB (ref 65–99)
Potassium: 4 mmol/L (ref 3.5–5.1)
Sodium: 140 mmol/L (ref 135–145)

## 2016-12-10 MED ORDER — CYCLOBENZAPRINE HCL 5 MG PO TABS
5.0000 mg | ORAL_TABLET | Freq: Three times a day (TID) | ORAL | Status: DC
  Filled 2016-12-10 (×3): qty 1

## 2016-12-10 MED ORDER — PANTOPRAZOLE SODIUM 40 MG PO TBEC
40.0000 mg | DELAYED_RELEASE_TABLET | Freq: Every day | ORAL | Status: DC
  Administered 2016-12-10: 40 mg via ORAL
  Filled 2016-12-10 (×3): qty 1

## 2016-12-10 MED ORDER — NORTRIPTYLINE HCL 25 MG PO CAPS
25.0000 mg | ORAL_CAPSULE | Freq: Every day | ORAL | Status: DC
  Filled 2016-12-10: qty 1

## 2016-12-10 MED ORDER — NORTRIPTYLINE HCL 25 MG PO CAPS
50.0000 mg | ORAL_CAPSULE | Freq: Every day | ORAL | Status: DC
  Administered 2016-12-10: 50 mg via ORAL
  Filled 2016-12-10 (×2): qty 2

## 2016-12-10 MED ORDER — NYSTATIN 100000 UNIT/GM EX CREA
TOPICAL_CREAM | Freq: Two times a day (BID) | CUTANEOUS | Status: DC | PRN
  Administered 2016-12-10: 16:00:00 via TOPICAL
  Filled 2016-12-10: qty 15
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
## 2016-12-10 NOTE — BHH Suicide Risk Assessment (Signed)
[facility] INPATIENT:  Family/Significant Other Suicide Prevention Education  Suicide Prevention Education:  Patient Refusal for Family/Significant Other Suicide Prevention Education: The patient [name] has refused to provide written consent for family/significant other to be provided Family/Significant Other Suicide Prevention Education during admission and/or prior to discharge.  Physician notified.  SPE completed with pt, as pt refused to consent to family contact. SPI pamphlet provided to pt and pt was encouraged to share information with support network, ask questions, and talk about any concerns relating to SPE. Pt denies access to guns/firearms and verbalized understanding of information provided. Mobile Crisis information also provided to pt.   Shanquita Ronning N Smart LCSW 12/10/2016, 3:41 PM

## 2016-12-10 NOTE — Progress Notes (Signed)
Pt rates:  Depression  9/10 Anxiety 9/10 Sleep- Poor "trazodone was not effective, would like to be started on Elavil".  SI/HI None [facility]. None  Pt verbalized to writer that he's agitated this morning because he did not sleep well last night. Pt verbalized feeling increasingly depressed "feeling like I'm at the bottom of the pit". Pt c/o of withdrawal symptoms of sweats, agitation and shakes. Pt c/o of chronic left leg pain. Pt given Motrin at his request. Pt gait steady. Pt denies needed an assistance device.  Medications reviewed with pt. Medications administered as ordered per MD. Verbal support provided. MD made aware of pt complaints during tx team. 15 minute checks performed for safety.   Pt receptive to tx.

## 2016-12-10 NOTE — Progress Notes (Signed)
CSW met with pt individually to discuss aftercare. Patient interested in [facility]--referral faxed however CSW notified pt that his medicare may not be accepted due to being [facility] managed medicare. Pt understood. Pt also interested in [place] and [place]. Pt given [place] list--interested in [facility] near [place], [place]. [place].  Maxie Better, MSW, LCSW Clinical Social Worker 12/10/2016 3:39 PM

## 2016-12-10 NOTE — Progress Notes (Signed)
Recreation Therapy Notes  Date: 12/10/16 Time: 0930 Location: 300 Hall Group Room  Group Topic: Stress Management  Goal Area(s) Addresses:  Patient will verbalize importance of using healthy stress management.  Patient will identify positive emotions associated with healthy stress management.   Intervention: Stress Management  Activity :  Mindfulness Meditation.  LRT introduced the stress management technique of mindfulness meditation.  LRT played Pierce meditation from the Calm app to allow patients to participate in mindfulness.  Patients were to follow along as the meditation played.   Education:  Stress Management, Discharge Planning.   Education Outcome: Acknowledges edcuation/In group clarification offered/Needs additional education  Clinical Observations/Feedback: Pt did not attend group.   [name], LRT/CTRS         [name] 12/10/2016 11:38 AM 

## 2016-12-10 NOTE — B[facility] Group Notes (Signed)
Pt attended the NA meeting tonight at 8:00. 

## 2016-12-10 NOTE — Progress Notes (Signed)
Adult Psychoeducational Group Note  Date:  12/10/2016 Time:  6:57 PM  Group Topic/Focus:  Stages of Change:   The focus of this group is to explain the stages of change and help patients identify changes they want to make upon discharge.  Participation Level:  Did Not Attend  Participation Quality:  Did not attend  Additional Comments: Pt did not attend group, pt remained in bed. Karleen HampshireFox, Weston Fulco Brittini 12/10/2016, 6:57 PM

## 2016-12-10 NOTE — [facility] Group Notes (Signed)
B[facility] LCSW Group Therapy  12/10/2016 2:56 PM  Type of Therapy:  Group Therapy  Participation Level:  Did Not Attend-pt invited. Chose to remain in bed.   Summary of Progress/Problems:  Feelings Around Diagnosis: Patients were encouraged to explore their diagnosis and what this means to them. Group members were asked to share how their support network could best support them in recovery and how they feel they are viewed by society based on diagnosis/stigma/etc.   Ledell PeoplesHeather N Smart LCSW 12/10/2016, 2:56 PM

## 2016-12-11 MED ORDER — AMITRIPTYLINE HCL 50 MG PO TABS
100.0000 mg | ORAL_TABLET | Freq: Every day | ORAL | Status: DC
  Administered 2016-12-11: 100 mg via ORAL
  Filled 2016-12-11: qty 1
  Filled 2016-12-11: qty 4
  Filled 2016-12-11: qty 1
  Filled 2016-12-11: qty 42

## 2016-12-11 MED ORDER — NICOTINE POLACRILEX 2 MG MT GUM: 2.0000 mg | CHEWING_GUM | OROMUCOSAL | Status: DC | PRN
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
## 2016-12-11 NOTE — [facility] Group Notes (Signed)
[facility] LCSW Group Therapy  12/11/2016 2:53 PM  Type of Therapy:  Group Therapy  Participation Level:  Did Not Attend-invited. Chose to remain in bed.   Summary of Progress/Problems: Emotion Regulation: This group focused on both positive and negative emotion identification and allowed group members to process ways to identify feelings, regulate negative emotions, and find healthy ways to manage internal/external emotions. Group members were asked to reflect on a time when their reaction to an emotion led to a negative outcome and explored how alternative responses using emotion regulation would have benefited them. Group members were also asked to discuss a time when emotion regulation was utilized when a negative emotion was experienced.   Marca Gadsby N Smart LCSW 12/11/2016, 2:53 PM

## 2016-12-11 NOTE — Progress Notes (Signed)
Patient ID: [name], male   DOB: 20-Jun-1976, 41 y.o.   MRN: [id number]  Pt currently presents with a flat affect and hostile, irritable behavior. Pt reports to writer that their goal is to "getting my &*%$ing medications and go to bed." Pr curses under his breath loudly. Pt reports poor sleep with current medication regimen. Pt body language is guarded. Gait is steady. Main complaint is withdrawal symptoms including agitation.   Pt provided with medications per providers orders. Pt's labs and vitals were monitored throughout the night. Pt given a 1:1 about emotional and mental status. Pt supported and encouraged to express concerns and questions. Pt educated on medications and assertiveness techniques.   Pt's safety ensured with 15 minute and environmental checks. Pt currently denies SI/HI and A/V hallucinations. Pt verbally agrees to seek staff if SI/HI or A/VH occurs and to consult with staff before acting on any harmful thoughts. Will continue POC.

## 2016-12-11 NOTE — Plan of Care (Signed)
Problem: Coping: Goal: Ability to interact with others will improve Outcome: Not Progressing Pt cusses at staff, hostile towards others

## 2016-12-11 NOTE — Progress Notes (Signed)
[facility] called--pt not eligible--managed medicare. Also [facility]. Pt given [facility] and encouraged to call. Pt given [facility] list--[facility] and was encouraged to call the houses this evening.  Trula SladeHeather Smart, MSW, LCSW Clinical Social Worker 12/11/2016 3:01 PM

## 2016-12-11 NOTE — Progress Notes (Signed)
[facility] He has phone screening at 4:00PM. Referral also has been faxed ATTN: Shayla. Possible bed availability on Friday. Pt concerned about having enough clothes--CSW encouraged him to discuss his concern with admissions coordinator and notified pt that it would be safest for [facility] to pick him up directly from hospital if he were accepted, rather than he discharge to get clothes and [facility]. Pt agreed to discuss with Shayla during phone screening and will follow-up with CSW in the morning.  Trula SladeHeather Smart, MSW, LCSW Clinical Social Worker 12/11/2016 3:57 PM

## 2016-12-11 NOTE — Progress Notes (Signed)
D:Pt is irritable and labile in mood this afternoon.Pt asked for ativan and CIWA score was less than 8. Pt responded "what do I have to do, tear something up to get an ativan" Pt has been on the phone much of the afternoon cursing and laughing. He requested ibuprofen and was given as ordered. Pt took vistaril today when offered saying "It doesn't help but I will take it." A:Discussed medications with MD and he confirmed that pt does not need ativan at present times. Will continue to monitor. R:Safety maintained on the unit.

## 2016-12-11 NOTE — [facility] Group Notes (Signed)
[facility] Group Notes:  (Nursing/MHT/Case Management/Adjunct)  Date:  12/11/2016  Time:  0845 am  Type of Therapy:  Psychoeducational Skills  Participation Level:  Did Not Attend  Patient invited; declined to attend.  Cranford MonBeaudry, Stein Windhorst Evans 12/11/2016, 9:49 AM

## 2016-12-11 NOTE — Progress Notes (Signed)
Patient ID: Scarlette ShortsLarry H Cohill, male   DOB: 1975-10-16, 41 y.o.   MRN: [id number]  Pt currently presents with a flat affect and anxious behavior. Pt reports to Clinical research associatewriter that their goal is [facility] Pt states [facility]." Pt reports poor sleep with current medication regimen. Reports that his sponsor is unsure of whether or not he will remain his sponsor at discharge.   Pt provided with medications per providers orders. Pt given as needed medications based on patients complaints and parameters of orders, see MAR. Pt's labs and vitals were monitored throughout the night. Pt given a 1:1 about emotional and mental status. Pt supported and encouraged to express concerns and questions. Pt educated on medications and alternative anxiety techniques.   Pt's safety ensured with 15 minute and environmental checks. Pt currently denies SI/HI and A/V hallucinations. Pt verbally agrees to seek staff if SI/HI or A/VH occurs and to consult with staff before acting on any harmful thoughts. Pt wishes to speak with CSW and MD tomorrow about [facility] and discharge. Will continue POC.

## 2016-12-12 MED ORDER — NICOTINE 21 MG/24HR TD PT24
21.0000 mg | MEDICATED_PATCH | Freq: Every day | TRANSDERMAL | Status: DC
  Administered 2016-12-12: 21 mg via TRANSDERMAL
  Filled 2016-12-12 (×2): qty 1

## 2016-12-12 MED ORDER — NYSTATIN 100000 UNIT/GM EX CREA: TOPICAL_CREAM | Freq: Two times a day (BID) | CUTANEOUS | 0 refills | Status: DC | PRN

## 2016-12-12 MED ORDER — PREGABALIN 100 MG PO CAPS: 300.0000 mg | ORAL_CAPSULE | Freq: Two times a day (BID) | ORAL | Status: DC

## 2016-12-12 MED ORDER — NICOTINE 21 MG/24HR TD PT24
MEDICATED_PATCH | TRANSDERMAL | Status: AC
  Filled 2016-12-12: qty 1

## 2016-12-12 MED ORDER — PREGABALIN 300 MG PO CAPS: 300.0000 mg | ORAL_CAPSULE | Freq: Two times a day (BID) | ORAL | 0 refills | Status: DC

## 2016-12-12 MED ORDER — NICOTINE 21 MG/24HR TD PT24: 21.0000 mg | MEDICATED_PATCH | Freq: Every day | TRANSDERMAL | 0 refills | Status: DC

## 2016-12-12 MED ORDER — ESOMEPRAZOLE MAGNESIUM 40 MG PO CPDR
40.0000 mg | DELAYED_RELEASE_CAPSULE | Freq: Every day | ORAL | Status: DC
  Filled 2016-12-12: qty 1

## 2016-12-12 MED ORDER — IBUPROFEN 600 MG PO TABS: 600.0000 mg | ORAL_TABLET | Freq: Four times a day (QID) | ORAL | 0 refills | Status: DC | PRN

## 2016-12-12 MED ORDER — AMITRIPTYLINE HCL 100 MG PO TABS: 100.0000 mg | ORAL_TABLET | Freq: Every day | ORAL | 0 refills | Status: DC

## 2016-12-12 MED ORDER — ESOMEPRAZOLE MAGNESIUM 40 MG PO CPDR: 40.0000 mg | DELAYED_RELEASE_CAPSULE | Freq: Every day | ORAL | Status: DC

## 2016-12-12 MED ORDER — HYDROXYZINE HCL 25 MG PO TABS: 25.0000 mg | ORAL_TABLET | Freq: Four times a day (QID) | ORAL | 0 refills | Status: DC | PRN

## 2016-12-12 MED ORDER — PREGABALIN 300 MG PO CAPS: 300.0000 mg | ORAL_CAPSULE | Freq: Two times a day (BID) | ORAL | Status: DC
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
## 2016-12-12 NOTE — Progress Notes (Signed)
Data. Patient denies SI/HI/AVH. Patient interacting well with staff and other patients. Patient affect has been blunt. He refused to complete her self assessment. Action. Emotional support and encouragement offered. Education provided on medication, indications and side effect. Q 15 minute checks done for safety. Response. Safety on the unit maintained through 15 minute checks.  Medications taken as prescribed. Attended groups. Remained calm and appropriate through out shift.  Pt. discharged to lobby and waiting [facility] driver.  Belongings sheet reviewed and signed by pt. and all belongings sent with patient, including sample medications and scripts. Paperwork reviewed and pt. able to verbalize understanding of education. Pt. in no current distress and ambulatory.

## 2016-12-12 NOTE — Progress Notes (Signed)
  [facility] Adult Case Management Discharge Plan :  Will you be returning to the same living situation after discharge:  No.[facility].  At discharge, do you have transportation home?: Yes,  [facility] driver will arrive by [id number] PM.  Do you have the ability to pay for your medications: Yes,  [place] Medicaid/managed medicare  Release of information consent forms completed and submitted to medical records by CSW.  Patient to Follow up at: Follow-up Information    [facility] Follow up.   Specialty:  Behavioral Health Why:  Walk in within 7 days of discharge from [facility] to be assessed for mental health services including: Medication management; counseling; outpatient substance abuse groups. Thank you.  Contact information: [address] [place] [address] [phone]        [facility] Follow up on 12/12/2016.   Why:  [facility]. You must have 21 day supply of medications. [facility] driver will pick you up at 12:30PM and will transport you directly to this facility. Thank you.  Contact information: [address] [place], [place] [address] Phone: [phone] Fax: [phone]          Next level of care provider has access to [facility] Link:no  Safety Planning and Suicide Prevention discussed: Yes,  SPE completed with pt; pt declined to consent to family contact. SPI pamphlet and Mobile Crisis information provdided.  Have you used any form of tobacco in the last 30 days? (Cigarettes, Smokeless Tobacco, Cigars, and/or Pipes): Yes  Has patient been referred to the Quitline?: Patient refused referral  Patient has been referred for addiction treatment: Yes  Dannia Snook N Smart LCSW 12/12/2016, 8:56 AM

## 2016-12-12 NOTE — Progress Notes (Signed)
Recreation Therapy Notes  Date: 12/12/16 Time: 0930 Location: [address] Dayroom  Group Topic: Stress Management  Goal Area(s) Addresses:  Patient will verbalize importance of using healthy stress management.  Patient will identify positive emotions associated with healthy stress management.   Intervention: Stress Management  Activity :  Progressive Muscle Relaxation.  LRT introduced the stress management technique of progressive muscle relaxation.  LRT read a script to allow patients the opportunity to engage in the activity.  Patients were to follow along as the script was read.  Education: Stress Management, Discharge Planning.   Education Outcome: Acknowledges edcuation/In group clarification offered/Needs additional education  Clinical Observations/Feedback: Pt did not attend group.   Estefan Pattison, LRT/CTRS        Mel Tadros A 12/12/2016 12:25 PM 

## 2016-12-12 NOTE — Tx Team (Signed)
Interdisciplinary Treatment and Diagnostic Plan Update  12/12/2016 Time of Session: [place] MRN: [id number]  Principal Diagnosis: Polysubstance abuse severe  Secondary Diagnoses: Active Problems:   Major depressive disorder, recurrent severe without psychotic features ([place])   Current Medications:  Current Facility-Administered Medications  Medication Dose Route Frequency Provider Last Rate Last Dose  . nicotine (NICODERM CQ - dosed in mg/24 hours) 21 mg/24hr patch           . alum & mag hydroxide-simeth (MAALOX/MYLANTA) 200-200-20 MG/5ML suspension 30 mL  30 mL Oral PRN Patrecia Pour, NP      . amitriptyline (ELAVIL) tablet 100 mg  100 mg Oral QHS Sharma Covert, MD   100 mg at 12/11/16 2139  . cloNIDine (CATAPRES) tablet 0.1 mg  0.1 mg Oral BH-qamhs Patrecia Pour, NP   0.1 mg at 12/12/16 5176   Followed by  . [START ON 12/13/2016] cloNIDine (CATAPRES) tablet 0.1 mg  0.1 mg Oral QAC breakfast Patrecia Pour, NP      . dicyclomine (BENTYL) tablet 20 mg  20 mg Oral Q6H PRN Patrecia Pour, NP   20 mg at 12/08/16 1839  . hydrOXYzine (ATARAX/VISTARIL) tablet 25 mg  25 mg Oral Q6H PRN Patrecia Pour, NP   25 mg at 12/11/16 1121  . ibuprofen (ADVIL,MOTRIN) tablet 600 mg  600 mg Oral Q6H PRN Laverle Hobby, PA-C   600 mg at 12/11/16 1620  . loperamide (IMODIUM) capsule 2-4 mg  2-4 mg Oral PRN Patrecia Pour, NP   4 mg at 12/09/16 0801  . LORazepam (ATIVAN) tablet 1 mg  1 mg Oral Q6H PRN Sharma Covert, MD   1 mg at 12/11/16 1945  . magnesium hydroxide (MILK OF MAGNESIA) suspension 30 mL  30 mL Oral Daily PRN Patrecia Pour, NP      . nicotine (NICODERM CQ - dosed in mg/24 hours) patch 21 mg  21 mg Transdermal Daily Jenne Campus, MD   21 mg at 12/12/16 0831  . nystatin cream (MYCOSTATIN)   Topical BID PRN Encarnacion Slates, NP      . pantoprazole (PROTONIX) EC tablet 40 mg  40 mg Oral Daily Laverle Hobby, PA-C   40 mg at 12/12/16 0824  . pregabalin (LYRICA) capsule 200 mg  200 mg  Oral BID Sharma Covert, MD   200 mg at 12/12/16 0827   PTA Medications: Prescriptions Prior to Admission  Medication Sig Dispense Refill Last Dose  . amitriptyline (ELAVIL) 100 MG tablet Take 1 tablet (100 mg total) by mouth at bedtime. 30 tablet 0 Past Week at Unknown time  . baclofen (LIORESAL) 10 MG tablet Take 10 mg by mouth 3 (three) times daily.   Past Week at Unknown time  . esomeprazole (NEXIUM) 40 MG capsule Take 40 mg by mouth daily.    12/07/2016 at Unknown time  . hydrOXYzine (ATARAX/VISTARIL) 25 MG tablet Take 1 tablet (25 mg total) by mouth every 8 (eight) hours as needed for itching. 20 tablet 0 Past Week at Unknown time  . NON FORMULARY BIO CLEANSE 2 TABLETS BID   Past Week at Unknown time  . NON FORMULARY DRINKS VINEGAR/ GINGER/ WATER 32 OZ DAILY   Past Week at Unknown time  . pregabalin (LYRICA) 300 MG capsule Take 300 mg by mouth 2 (two) times daily.   Past Week at Unknown time    Patient Stressors: Financial difficulties Health problems Marital or family conflict  Patient Strengths: Agricultural engineer for treatment/growth Supportive family/friends  Treatment Modalities: Medication Management, Group therapy, Case management,  1 to 1 session with clinician, Psychoeducation, Recreational therapy.   Physician Treatment Plan for Primary Diagnosis: Polysubstance abuse severe  Long Term Goal(s): Improvement in symptoms so as ready for discharge Improvement in symptoms so as ready for discharge   Short Term Goals: Ability to identify changes in lifestyle to reduce recurrence of condition will improve Ability to verbalize feelings will improve Ability to disclose and discuss suicidal ideas Ability to demonstrate self-control will improve Ability to identify and develop effective coping behaviors will improve Ability to maintain clinical measurements within normal limits will improve Compliance with prescribed medications will improve Ability to identify  triggers associated with substance abuse/mental health issues will improve Ability to identify changes in lifestyle to reduce recurrence of condition will improve Ability to verbalize feelings will improve Ability to disclose and discuss suicidal ideas Ability to demonstrate self-control will improve Ability to identify and develop effective coping behaviors will improve Ability to maintain clinical measurements within normal limits will improve Compliance with prescribed medications will improve Ability to identify triggers associated with substance abuse/mental health issues will improve  Medication Management: Evaluate patient's response, side effects, and tolerance of medication regimen.  Therapeutic Interventions: 1 to 1 sessions, Unit Group sessions and Medication administration.  Evaluation of Outcomes: Met  Physician Treatment Plan for Secondary Diagnosis: Active Problems:   Major depressive disorder, recurrent severe without psychotic features ([place])  Long Term Goal(s): Improvement in symptoms so as ready for discharge Improvement in symptoms so as ready for discharge   Short Term Goals: Ability to identify changes in lifestyle to reduce recurrence of condition will improve Ability to verbalize feelings will improve Ability to disclose and discuss suicidal ideas Ability to demonstrate self-control will improve Ability to identify and develop effective coping behaviors will improve Ability to maintain clinical measurements within normal limits will improve Compliance with prescribed medications will improve Ability to identify triggers associated with substance abuse/mental health issues will improve Ability to identify changes in lifestyle to reduce recurrence of condition will improve Ability to verbalize feelings will improve Ability to disclose and discuss suicidal ideas Ability to demonstrate self-control will improve Ability to identify and develop effective coping  behaviors will improve Ability to maintain clinical measurements within normal limits will improve Compliance with prescribed medications will improve Ability to identify triggers associated with substance abuse/mental health issues will improve     Medication Management: Evaluate patient's response, side effects, and tolerance of medication regimen.  Therapeutic Interventions: 1 to 1 sessions, Unit Group sessions and Medication administration.  Evaluation of Outcomes:Met   RN Treatment Plan for Primary Diagnosis:Polysubstance abuse severe  Long Term Goal(s): Knowledge of disease and therapeutic regimen to maintain health will improve  Short Term Goals: Ability to remain free from injury will improve, Ability to disclose and discuss suicidal ideas and Ability to identify and develop effective coping behaviors will improve  Medication Management: RN will administer medications as ordered by provider, will assess and evaluate patient's response and provide education to patient for prescribed medication. RN will report any adverse and/or side effects to prescribing provider.  Therapeutic Interventions: 1 on 1 counseling sessions, Psychoeducation, Medication administration, Evaluate responses to treatment, Monitor vital signs and CBGs as ordered, Perform/monitor CIWA, COWS, AIMS and Fall Risk screenings as ordered, Perform wound care treatments as ordered.  Evaluation of Outcomes: Met   LCSW Treatment Plan for Primary Diagnosis: Polysubstance abuse severe  Long Term Goal(s): Safe transition to appropriate next level of care at discharge, Engage patient in therapeutic group addressing interpersonal concerns.  Short Term Goals: Engage patient in aftercare planning with referrals and resources, Facilitate patient progression through stages of change regarding substance use diagnoses and concerns and Identify triggers associated with mental health/substance abuse issues  Therapeutic  Interventions: Assess for all discharge needs, 1 to 1 time with Social worker, Explore available resources and support systems, Assess for adequacy in community support network, Educate family and significant other(s) on suicide prevention, Complete Psychosocial Assessment, Interpersonal group therapy.  Evaluation of Outcomes: Met  Progress in Treatment: Attending groups: Yes Participating in groups: Yes Taking medication as prescribed: Yes. Toleration medication: Yes. Family/Significant other contact made: SPE completed with pt; pt declined to consent to family contact.  Patient understands diagnosis: Yes. Discussing patient identified problems/goals with staff: Yes. Medical problems stabilized or resolved: Yes. Denies suicidal/homicidal ideation: Yes, self report.  Issues/concerns per patient self-inventory: No. Other: n/a   New problem(s) identified: No, Describe:  n/a  New Short Term/Long Term Goal(s): Detox; medication stabilization; development of comprehensive mental wellness/sobriety plan.   Discharge Plan or Barriers: Pt accepted to [facility] for today. After [facility], pt is planning to enter [employer] and will follow-up at [facility] on an outpatient basis. [facility] Driver will pick up patient at 1230pm today.   Reason for Continuation of Hospitalization: none  Estimated Length of Stay: discharge today  Attendees: Patient: 12/12/2016 8:58 AM  Physician: Dr. Mallie Darting MD 12/12/2016 8:58 AM  Nursing: Sherrine Maples RN 12/12/2016 8:58 AM  RN Care Manager: Reita May RN 12/12/2016 8:58 AM  Social Worker: Press photographer, LCSW; Matthew Saras LCSWA 12/12/2016 8:58 AM  Recreational Therapist: Rhunette Croft 12/12/2016 8:58 AM  Other: Lindell Spar NP; Samuel Jester NP 12/12/2016 8:58 AM  Other:  12/12/2016 8:58 AM  Other: 12/12/2016 8:58 AM    Scribe for Treatment Team: [place], LCSW 12/12/2016 8:58 AM

## 2016-12-12 NOTE — [facility] Suicide Risk Assessment (Signed)
[facility] Discharge Suicide Risk Assessment   Principal Problem: depression, anxiety, substance abuse Discharge Diagnoses:  Patient Active Problem List   Diagnosis Date Noted  . Major depressive disorder, recurrent severe without psychotic features (HCC) [F33.2] 12/08/2016  . Cholangitis [K83.0] 10/04/2016  . Acute hepatitis [B17.9] 10/04/2016  . Acute cholecystitis [K81.0] 10/04/2016  . Jaundice [R17] 10/04/2016  . Hyperbilirubinemia [E80.6] 10/04/2016  . Itching [L29.9] 10/04/2016  . Peptic ulcer disease [K27.9] 10/04/2016  . RUQ pain [R10.11]   . Hepatitis C [B19.20] 07/12/2016  . Cannabis use disorder, severe, dependence (HCC) [F12.20] 07/10/2016  . Cocaine use disorder, severe, dependence (HCC) [F14.20] 07/10/2016  . Opioid use disorder, severe, dependence (HCC) [F11.20] 07/10/2016  . Alcohol use disorder, severe, dependence (HCC) [F10.20] 07/10/2016  . Tobacco use disorder [F17.200] 07/10/2016  . MDD (major depressive disorder), recurrent episode, severe (HCC) [F33.2] 07/09/2016  . Chronic back pain [M54.9, G89.29] 03/01/2013  . 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 GERD (gastroesophageal reflux disease) [K21.9] 03/01/2013    Total Time spent with patient: 20 minutes  Musculoskeletal: Strength & Muscle Tone: within normal limits Gait & Station: normal Patient leans: N/A  Psychiatric Specialty Exam: ROS  Blood pressure 95/62, pulse (!) 122, temperature 97.9 F (36.6 C), temperature source Oral, resp. rate 16, height 5' 9.02" (1.753 m), weight 84.8 kg (187 lb), SpO2 99 %.Body mass index is 27.6 kg/m.  General Appearance: Casual  Eye Contact::  Fair  Speech:  Normal Rate409  Volume:  Normal  Mood:  Euthymic  Affect:  Appropriate  Thought Process:  Coherent  Orientation:  Full (Time, Place, and Person)  Thought Content:  Logical  Suicidal Thoughts:  No  Homicidal Thoughts:  No  Memory:  Immediate;   Good  Judgement:  Intact  Insight:  Fair   Psychomotor Activity:  Normal  Concentration:  Fair  Recall:  Good  Fund of Knowledge:Good  Language: Good  Akathisia:  No  Handed:  Right  AIMS (if indicated):     Assets:  Desire for Improvement  Sleep:  Number of Hours: 3.75  Cognition: WNL  ADL's:  Intact   Mental Status Per Nursing Assessment::   On Admission:     Demographic Factors:  Male, Caucasian and Low socioeconomic status  Loss Factors: Financial problems/change in socioeconomic status  Historical Factors: Impulsivity  Risk Reduction Factors:   NA  Continued Clinical Symptoms:  Alcohol/Substance Abuse/Dependencies  Cognitive Features That Contribute To Risk:  Thought constriction (tunnel vision)    Suicide Risk:  Minimal: No identifiable suicidal ideation.  Patients presenting with no risk factors but with morbid ruminations; may be classified as minimal risk based on the severity of the depressive symptoms  Follow-up Information    [facility] Follow up.   Specialty:  Behavioral Health Why:  Walk in within 7 days of discharge from [facility] to be assessed for mental health services including: Medication management; counseling; outpatient substance abuse groups. Thank you.  Contact information: [address] [place] [address] [phone]        [facility] Follow up on 12/12/2016.   Why:  You have been accepted to [facility] for today. You must have 21 day supply of medications. [facility] driver will pick you up at 12:30PM and will transport you directly to this facility. Thank you.  Contact information: [address] [place], [place] [address] Phone: [phone] Fax: [phone]          Plan Of Care/Follow-up recommendations:  Activity:  ad lib  Antonieta PertGreg Lawson Clary, MD 12/12/2016, 8:58 AM

## 2017-02-10 ENCOUNTER — Other Ambulatory Visit (INDEPENDENT_AMBULATORY_CARE_PROVIDER_SITE_OTHER): Payer: Medicare PPO

## 2017-02-10 ENCOUNTER — Encounter: Payer: Self-pay | Admitting: Gastroenterology

## 2017-02-10 ENCOUNTER — Ambulatory Visit (INDEPENDENT_AMBULATORY_CARE_PROVIDER_SITE_OTHER): Payer: Medicare PPO | Admitting: Gastroenterology

## 2017-02-10 VITALS — BP 110/72 | HR 84 | Ht 69.0 in | Wt 190.0 lb

## 2017-02-10 DIAGNOSIS — R945 Abnormal results of liver function studies: Secondary | ICD-10-CM

## 2017-02-10 DIAGNOSIS — B182 Chronic viral hepatitis C: Secondary | ICD-10-CM

## 2017-02-10 DIAGNOSIS — B169 Acute hepatitis B without delta-agent and without hepatic coma: Secondary | ICD-10-CM

## 2017-02-10 DIAGNOSIS — R7989 Other specified abnormal findings of blood chemistry: Secondary | ICD-10-CM

## 2017-02-10 LAB — HEPATIC FUNCTION PANEL
ALK PHOS: 86 U/L (ref 39–117)
ALT: 33 U/L (ref 0–53)
AST: 28 U/L (ref 0–37)
Albumin: 4.4 g/dL (ref 3.5–5.2)
BILIRUBIN DIRECT: 0.1 mg/dL (ref 0.0–0.3)
BILIRUBIN TOTAL: 0.6 mg/dL (ref 0.2–1.2)
Total Protein: 7.6 g/dL (ref 6.0–8.3)

## 2017-02-10 NOTE — Patient Instructions (Addendum)
If you are age 42 or older, your body mass index should be between 23-30. Your Body mass index is 28.06 kg/m. If this is out of the aforementioned range listed, please consider follow up with your Primary Care Provider.  If you are age 42 or younger, your body mass index should be between 19-25. Your Body mass index is 28.06 kg/m. If this is out of the aformentioned range listed, please consider follow up with your Primary Care Provider.   Your physician has requested that you go to the basement for lab work before leaving today..  Thank you for choosing [facility]  Dr Amada JupiterHenry Danis III

## 2017-02-10 NOTE — Progress Notes (Signed)
      GI Progress Note  Chief Complaint: chronic HCV (3a), acute Hep B last year (prior IVDU)  Subjective  History:  This is follow-up for 41 year old man with chronic viral hepatitis. He has years of chronic hepatitis C infection. I met him late last year when he developed acute hepatitis B with IV drug use. He reports not using IV drugs for the last 6 months, but unfortunately he had a relapse with alcohol and cocaine use 2 months ago. He was seen in the ED, toxicology screen noted below, he was then sent to rehabilitation. 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 He has lately developed a rash over his abdominal wall that has not improved very much on Atarax. He has generalized fatigue.  ROS: Cardiovascular:  no chest pain Respiratory: no dyspnea  The patient's Past Medical, Family and Social History were reviewed and are on file in the EMR.  Objective:  Med list reviewed  Vital signs in last 24 hrs: Vitals:   02/10/17 1408  BP: 110/72  Pulse: 84    Physical Exam    HEENT: sclera anicteric, oral mucosa moist without lesions  Neck: supple, no thyromegaly, JVD or lymphadenopathy  Cardiac: RRR without murmurs, S1S2 heard, no peripheral edema  Pulm: clear to auscultation bilaterally, normal RR and effort noted  Abdomen: soft, No tenderness, with active bowel sounds. No guarding or palpable hepatosplenomegaly.  Skin; warm and dry, no jaundice . He has a fine lacy rash over the flanks and anterior abdominal wall. It is nontender and not friable or bleeding. It is not purpura or petechial  Recent Labs:  12/08/16 Tox screen - pos for opiates and cocaine  Hepatic Function Latest Ref Rng & Units 12/08/2016 11/04/2016 10/21/2016  Total Protein 6.5 - 8.1 g/dL 7.7 7.5 6.5  Albumin 3.5 - 5.0 g/dL 4.3 4.1 3.5  AST 15 - 41 U/L 40 123(H) 410(H)  ALT 17 - 63 U/L 46 112(H) 414(H)  Alk Phosphatase 38 - 126 U/L 97 142(H) 134(H)  Total Bilirubin 0.3 - 1.2 mg/dL  1.4(H) 4.3(H) 14.0(H)  Bilirubin, Direct 0.0 - 0.3 mg/dL - 2.3(H) 7.8(H)     '@ASSESSMENTPLANBEGIN'$ @ Assessment: Encounter Diagnoses  Name Primary?  . Acute hepatitis B Yes  . LFT elevation   . Chronic hepatitis C without hepatic coma (HCC)     He has minimal persistent elevated bilirubin. We need to know if he is chronically infected with hepatitis B. He has an upcoming consult visit with infectious disease to discuss hepatitis C. I will message that physician M know that we are still sorting out the hepatitis B issue.  Plan:  Hepatitis B surface antigen, surface antibody and total core antibody If surface antigen still positive, he will need an E antigen, E antibody hepatitis delta antibody and hepatitis B viral load  He was encouraged to see primary care about this rash. It does not appear to be related to the hepatitis.  Total time 15 minutes, over half spent in counseling and coordination of care.   Nelida Meuse III

## 2017-02-11 ENCOUNTER — Telehealth: Payer: Self-pay | Admitting: Gastroenterology

## 2017-02-11 ENCOUNTER — Other Ambulatory Visit: Payer: Self-pay

## 2017-02-11 DIAGNOSIS — B179 Acute viral hepatitis, unspecified: Secondary | ICD-10-CM

## 2017-02-11 LAB — HEPATITIS B CORE ANTIBODY, IGM: Hep B C IgM: REACTIVE — AB

## 2017-02-11 LAB — HEPATITIS B SURFACE ANTIBODY,QUALITATIVE: Hep B S Ab: NEGATIVE

## 2017-02-11 LAB — HEPATITIS B SURFACE ANTIGEN: HEP B S AG: NEGATIVE

## 2017-02-12 ENCOUNTER — Other Ambulatory Visit: Payer: Medicare PPO

## 2017-02-12 DIAGNOSIS — B179 Acute viral hepatitis, unspecified: Secondary | ICD-10-CM

## 2017-02-14 LAB — HEPATITIS B DNA, ULTRAQUANTITATIVE, PCR
HEPATITIS B DNA (CALC): 1.04 {Log_IU}/mL — AB
HEPATITIS B DNA: 11 [IU]/mL — AB

## 2017-02-16 ENCOUNTER — Telehealth: Payer: Self-pay | Admitting: Gastroenterology

## 2017-02-16 LAB — HEPATITIS B E ANTIBODY: Hepatitis Be Antibody: NONREACTIVE

## 2017-02-16 LAB — HEPATITIS B E ANTIGEN: Hepatitis Be Antigen: REACTIVE — AB

## 2017-02-16 NOTE — Telephone Encounter (Signed)
Results given to pt

## 2017-02-16 NOTE — Telephone Encounter (Signed)
Patient has called looking for lab results. I spoke to patient to let him know that as soon as Dr. Myrtie Neitheranis has had a chance to look at them, we would contact him.

## 2017-02-17 LAB — HEPATITIS DELTA ANTIBODY: Hepatitis D Ab, Total: NEGATIVE

## 2017-02-22 ENCOUNTER — Emergency Department (HOSPITAL_COMMUNITY): Payer: Medicare PPO

## 2017-02-22 ENCOUNTER — Encounter (HOSPITAL_COMMUNITY): Payer: Self-pay | Admitting: Emergency Medicine

## 2017-02-22 ENCOUNTER — Emergency Department (HOSPITAL_COMMUNITY)
Admission: EM | Admit: 2017-02-22 | Discharge: 2017-02-22 | Disposition: A | Discharge status: Home / Self Care | Payer: Medicare PPO | Attending: Emergency Medicine | Admitting: Emergency Medicine

## 2017-02-22 DIAGNOSIS — N390 Urinary tract infection, site not specified: Secondary | ICD-10-CM | POA: Insufficient documentation

## 2017-02-22 DIAGNOSIS — Z79899 Other long term (current) drug therapy: Secondary | ICD-10-CM | POA: Diagnosis not present

## 2017-02-22 DIAGNOSIS — M545 Low back pain, unspecified: Secondary | ICD-10-CM

## 2017-02-22 DIAGNOSIS — R109 Unspecified abdominal pain: Secondary | ICD-10-CM | POA: Diagnosis not present

## 2017-02-22 DIAGNOSIS — F17210 Nicotine dependence, cigarettes, uncomplicated: Secondary | ICD-10-CM | POA: Diagnosis not present

## 2017-02-22 LAB — URINALYSIS, ROUTINE W REFLEX MICROSCOPIC
Bacteria, UA: NONE SEEN
Bilirubin Urine: NEGATIVE
GLUCOSE, UA: NEGATIVE mg/dL
HGB URINE DIPSTICK: NEGATIVE
KETONES UR: NEGATIVE mg/dL
NITRITE: NEGATIVE
PH: 5 (ref 5.0–8.0)
Protein, ur: NEGATIVE mg/dL
Specific Gravity, Urine: 1.02 (ref 1.005–1.030)

## 2017-02-22 MED ORDER — OXYCODONE HCL 5 MG PO TABS
15.0000 mg | ORAL_TABLET | Freq: Once | ORAL | Status: AC
Start: 2017-02-22 — End: 2017-02-22
  Administered 2017-02-22: 15 mg via ORAL
  Filled 2017-02-22: qty 3

## 2017-02-22 MED ORDER — KETOROLAC TROMETHAMINE 60 MG/2ML IM SOLN
60.0000 mg | Freq: Once | INTRAMUSCULAR | Status: AC
  Administered 2017-02-22: 60 mg via INTRAMUSCULAR
  Filled 2017-02-22: qty 2

## 2017-02-22 MED ORDER — CIPROFLOXACIN HCL 500 MG PO TABS: 500.0000 mg | ORAL_TABLET | Freq: Two times a day (BID) | ORAL | 0 refills | Status: DC

## 2017-02-22 MED ORDER — METHOCARBAMOL 1000 MG/10ML IJ SOLN
1000.0000 mg | Freq: Once | INTRAMUSCULAR | Status: AC
  Administered 2017-02-22: 1000 mg via INTRAMUSCULAR
  Filled 2017-02-22: qty 10

## 2017-02-22 MED ORDER — IBUPROFEN 600 MG PO TABS: 600.0000 mg | ORAL_TABLET | Freq: Four times a day (QID) | ORAL | 0 refills | Status: DC | PRN

## 2017-02-22 MED ORDER — CIPROFLOXACIN HCL 500 MG PO TABS
500.0000 mg | ORAL_TABLET | Freq: Once | ORAL | Status: AC
  Administered 2017-02-22: 500 mg via ORAL
  Filled 2017-02-22: qty 1
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
## 2017-02-22 NOTE — ED Triage Notes (Signed)
Pt c/o left back pain x 1 week. Pt reports black/brown colored urine. Pt has history of hep c and liver issues, reports that also affects his kidneys some times.

## 2017-02-22 NOTE — ED Notes (Signed)
EDP at bedside  

## 2017-02-22 NOTE — ED Notes (Signed)
Patient transported to CT 

## 2017-02-23 ENCOUNTER — Telehealth: Payer: Self-pay

## 2017-02-23 NOTE — Telephone Encounter (Signed)
Patient advised of labs, he has a follow up appointment with Dr. Luciana Axeomer on 5/30.

## 2017-02-24 LAB — URINE CULTURE: CULTURE: NO GROWTH

## 2017-03-04 ENCOUNTER — Ambulatory Visit (INDEPENDENT_AMBULATORY_CARE_PROVIDER_SITE_OTHER): Payer: Medicare PPO | Admitting: Internal Medicine

## 2017-03-04 ENCOUNTER — Encounter: Payer: Self-pay | Admitting: Internal Medicine

## 2017-03-04 VITALS — BP 132/81 | HR 74 | Temp 97.7°F | Ht 69.0 in | Wt 190.0 lb

## 2017-03-04 DIAGNOSIS — B182 Chronic viral hepatitis C: Secondary | ICD-10-CM
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
## 2017-03-09 IMAGING — DX DG CHEST 2V
2 series · 2 of 2 positions shown · non-contrast
Comparison: 03/08/2016

CLINICAL DATA: Productive cough

EXAM:
CHEST  2 VIEW

[chest pa]
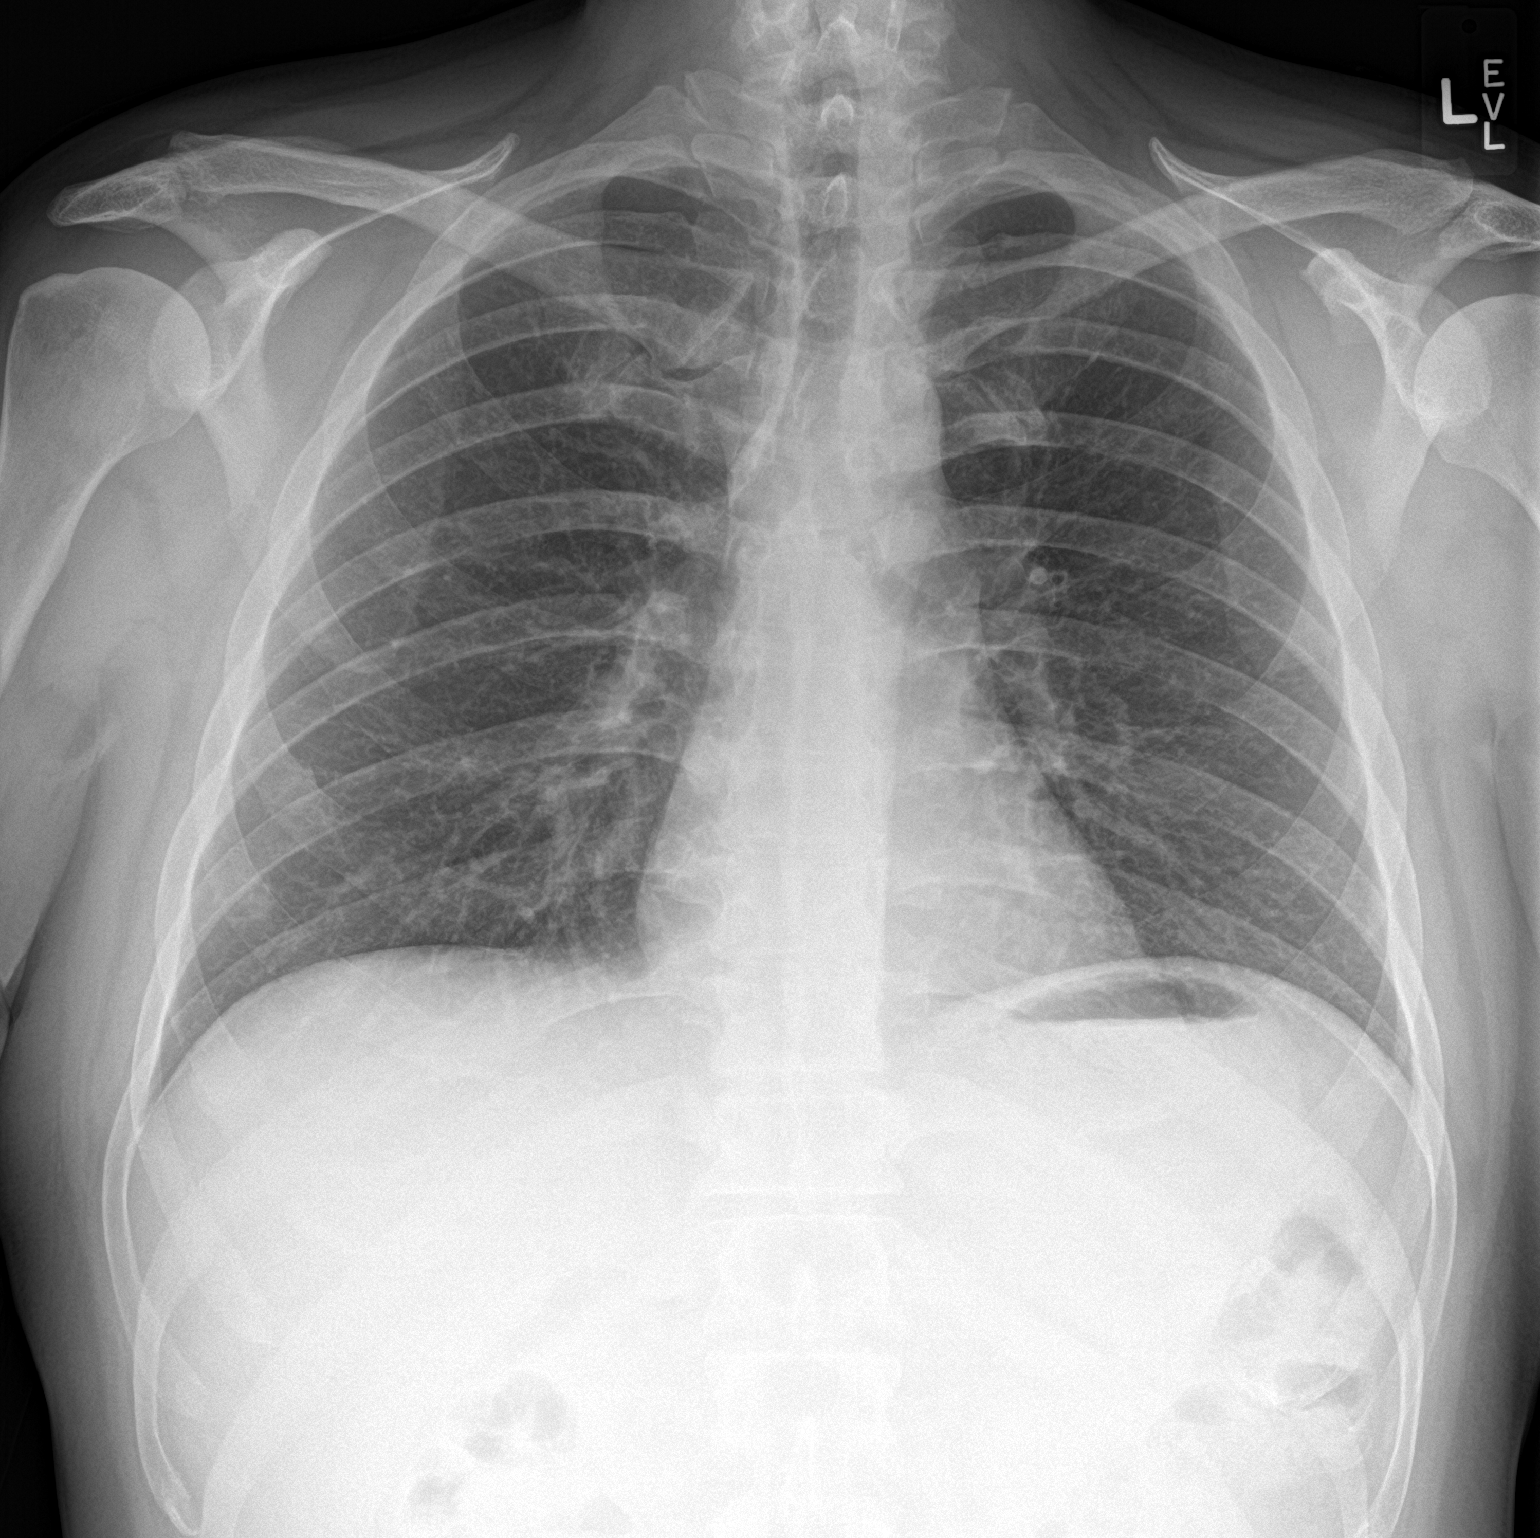

[chest lat]
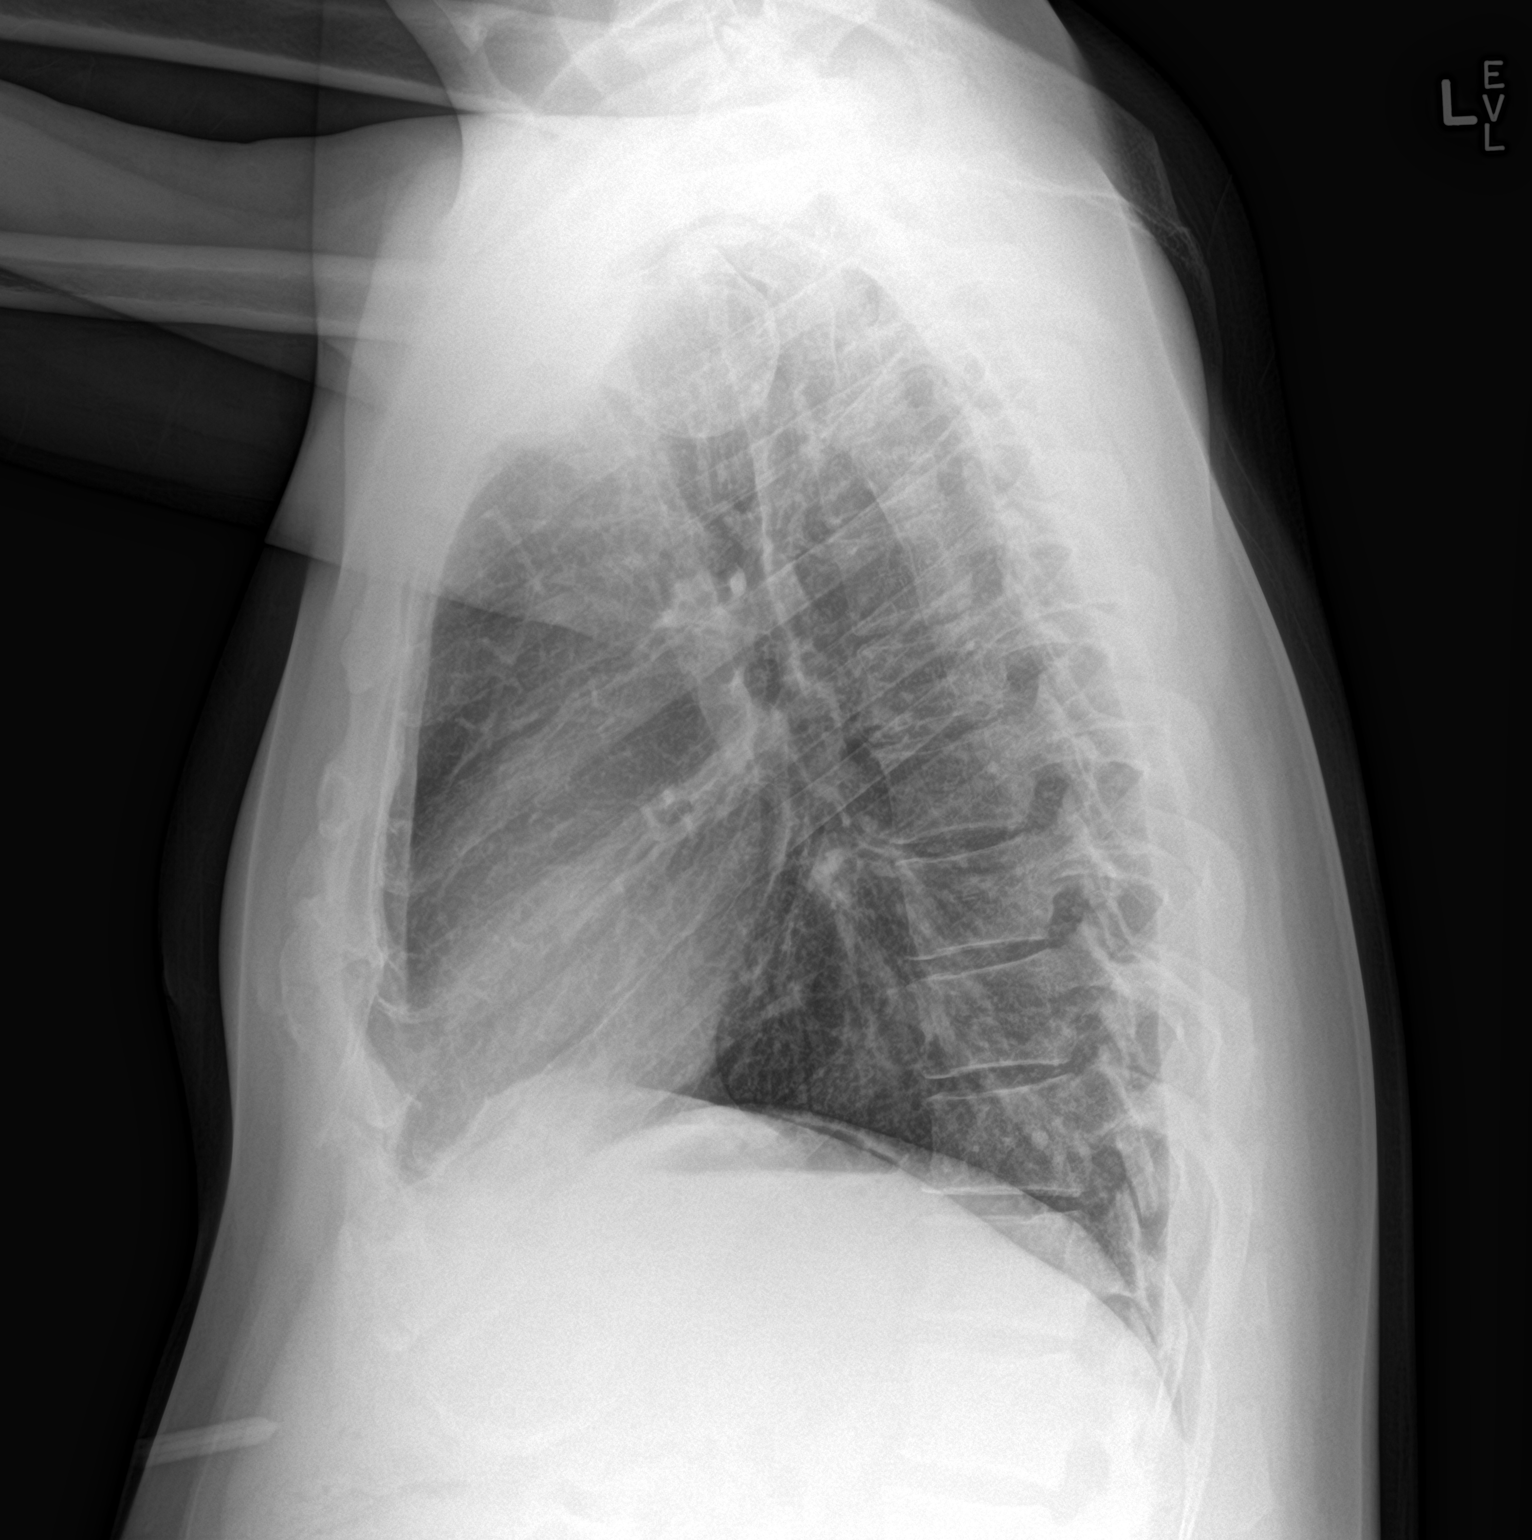

[2 of 2 positions shown; findings below may reference images not displayed]

FINDINGS: The heart size and mediastinal contours are within normal limits.
Both lungs are clear. The visualized skeletal structures are
unremarkable.
IMPRESSION: No active cardiopulmonary disease.

## 2017-04-07 ENCOUNTER — Ambulatory Visit: Payer: Self-pay | Admitting: Internal Medicine

## 2017-04-18 ENCOUNTER — Emergency Department (HOSPITAL_COMMUNITY): Payer: Medicare PPO

## 2017-04-18 ENCOUNTER — Emergency Department (HOSPITAL_COMMUNITY)
Admission: EM | Admit: 2017-04-18 | Discharge: 2017-04-18 | Disposition: A | Discharge status: Home / Self Care | Payer: Medicare PPO | Attending: Emergency Medicine | Admitting: Emergency Medicine

## 2017-04-18 DIAGNOSIS — Z79899 Other long term (current) drug therapy: Secondary | ICD-10-CM | POA: Insufficient documentation

## 2017-04-18 DIAGNOSIS — F17210 Nicotine dependence, cigarettes, uncomplicated: Secondary | ICD-10-CM | POA: Insufficient documentation

## 2017-04-18 DIAGNOSIS — M25572 Pain in left ankle and joints of left foot: Secondary | ICD-10-CM | POA: Diagnosis present

## 2017-04-18 MED ORDER — IBUPROFEN 600 MG PO TABS: 600.0000 mg | ORAL_TABLET | Freq: Four times a day (QID) | ORAL | 0 refills | Status: DC | PRN

## 2017-04-18 MED ORDER — IBUPROFEN 600 MG PO TABS: 600.0000 mg | ORAL_TABLET | Freq: Three times a day (TID) | ORAL | 0 refills | Status: DC | PRN

## 2017-04-18 MED ORDER — IBUPROFEN 400 MG PO TABS
600.0000 mg | ORAL_TABLET | Freq: Once | ORAL | Status: AC
  Administered 2017-04-18: 11:00:00 600 mg via ORAL
  Filled 2017-04-18: qty 1
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
## 2017-04-18 NOTE — ED Notes (Signed)
Patient transported to X-ray 

## 2017-04-18 NOTE — Discharge Instructions (Signed)
Rest and ice your ankle for the next several days. When using ice, apply the ice the affected area for 20 minutes at a time. You may take ibuprofen 600 as needed for pain. Take this up to 3 times a day with meals. Do not take other anti-inflammatories (including naproxen, Aleve, Motrin, Advil) while taking ibuprofen. Continue to take your at-home pain medicine. Follow-up with your primary care in 1-2 weeks as needed if symptoms are not improving. Return to the emergency department if you develop worsening pain, numbness, tingling, or any new or worsening symptoms.
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
## 2017-05-20 ENCOUNTER — Encounter: Payer: Self-pay | Admitting: Internal Medicine

## 2017-05-23 IMAGING — CT CT RENAL STONE PROTOCOL
2 of 4 series · 16 of 46 positions shown, 18 images · non-contrast
Comparison: October 04, 2016

CLINICAL DATA: Left flank pain.

EXAM:
CT ABDOMEN AND PELVIS WITHOUT CONTRAST
TECHNIQUE: Multidetector CT imaging of the abdomen and pelvis was performed
following the standard protocol without IV contrast.

[Series 3: stone study 5.0 i30f 2 · axial · 0.86mm/px · z∈[+1271,+1721]mm · 13 of 100 slices shown, 15 images]
[im 5/100  soft-tissue]
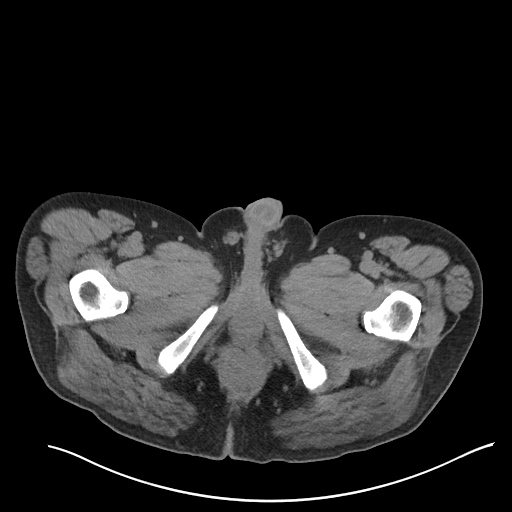
[im 5/100  bone]
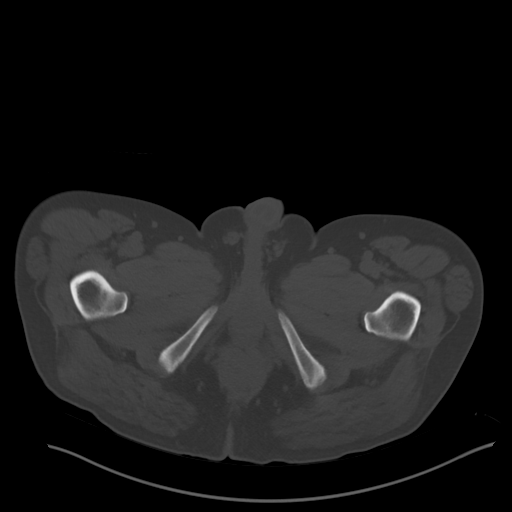
[im 13/100  soft-tissue]
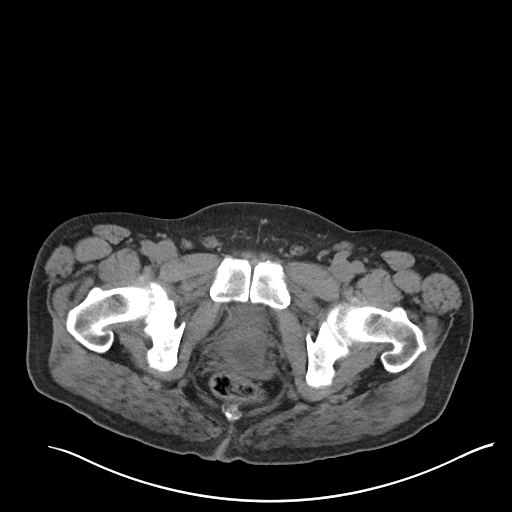
[im 21/100  soft-tissue]
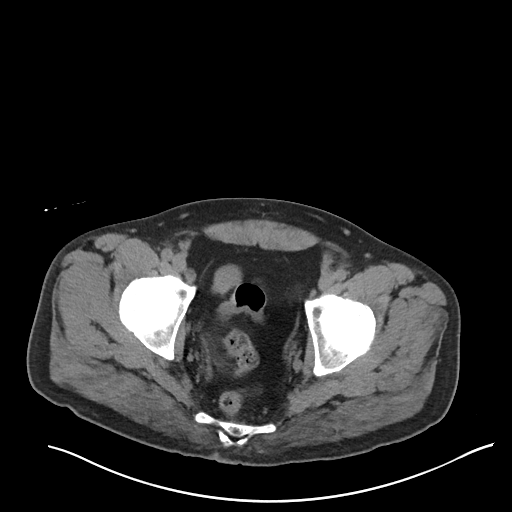
[im 29/100  soft-tissue]
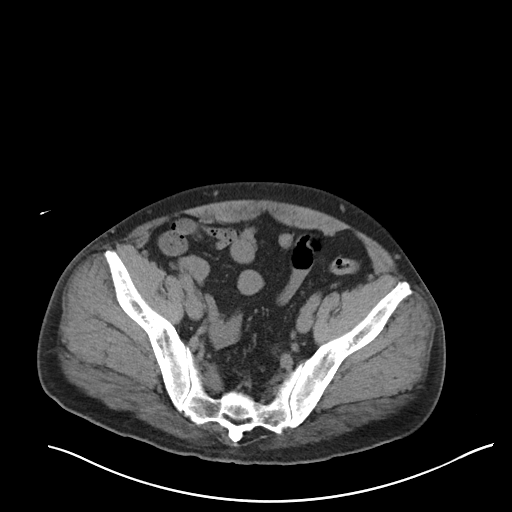
[im 34/100  soft-tissue]
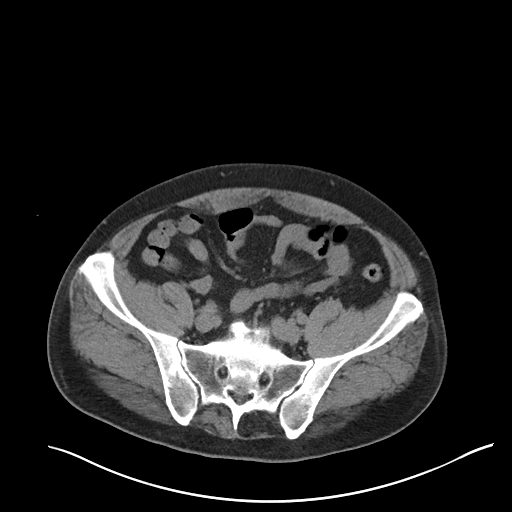
[im 42/100  soft-tissue]
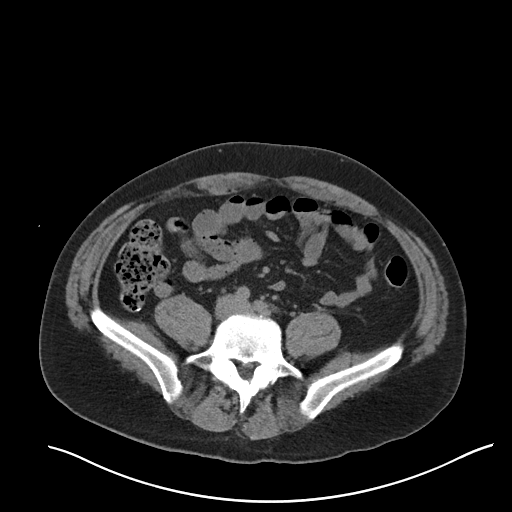
[im 50/100  soft-tissue]
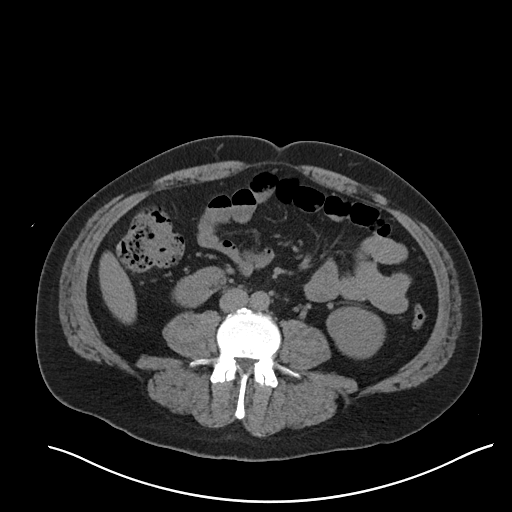
[im 58/100  soft-tissue]
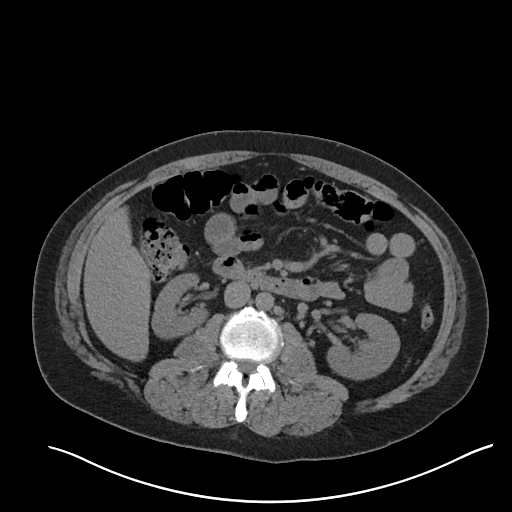
[im 67/100  soft-tissue]
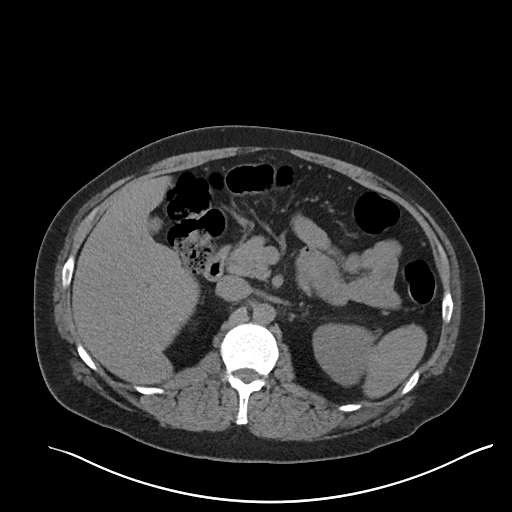
[im 67/100  bone]
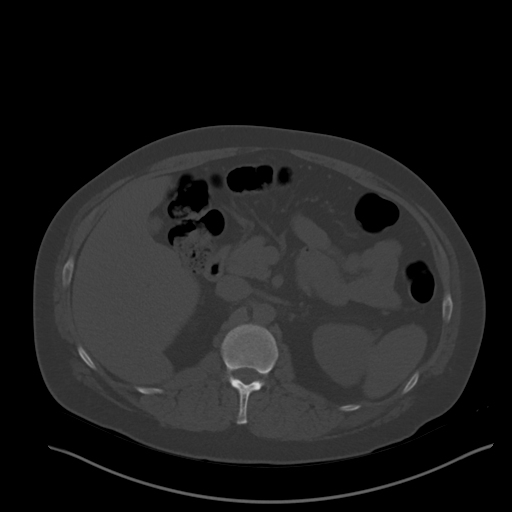
[im 71/100  soft-tissue]
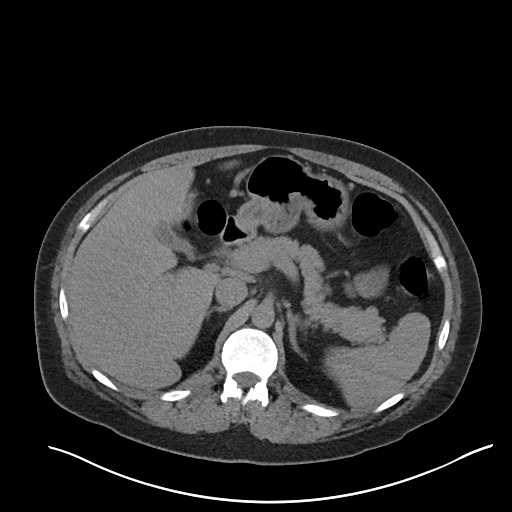
[im 79/100  soft-tissue]
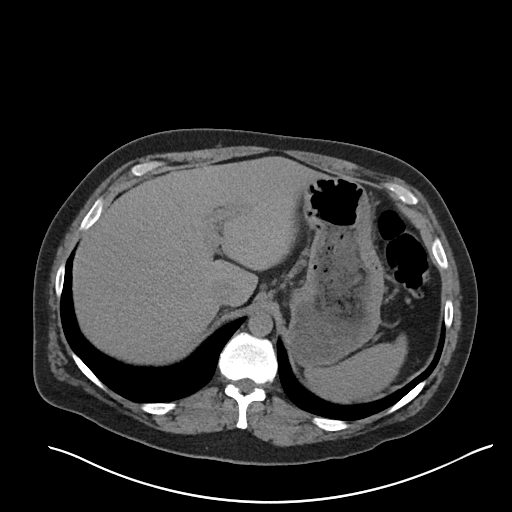
[im 87/100  soft-tissue]
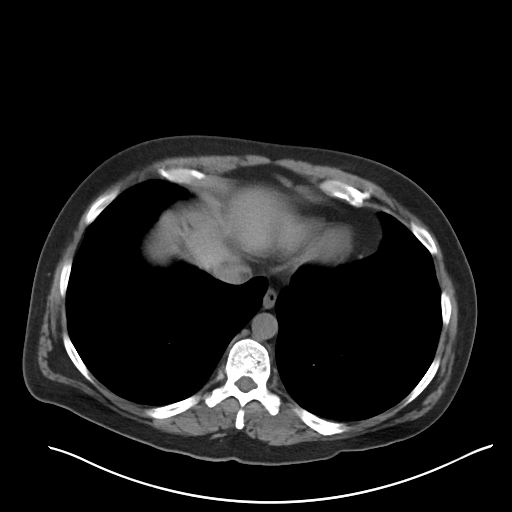
[im 95/100  soft-tissue]
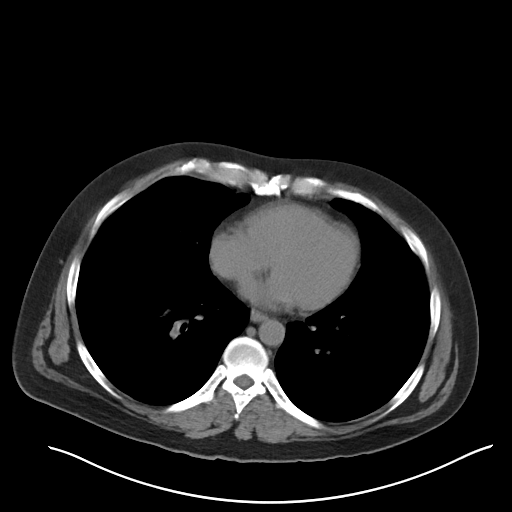

[Series 6: coronal soft tissue · coronal · 0.80mm/px · 3 of 91 slices shown]
[im 31/91  soft-tissue]
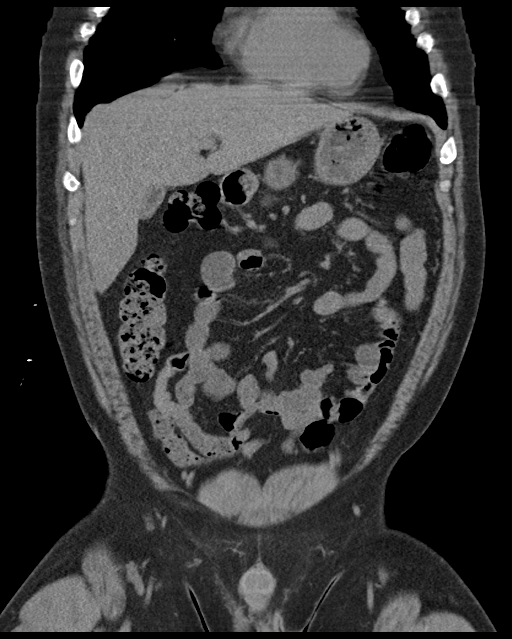
[im 41/91  soft-tissue]
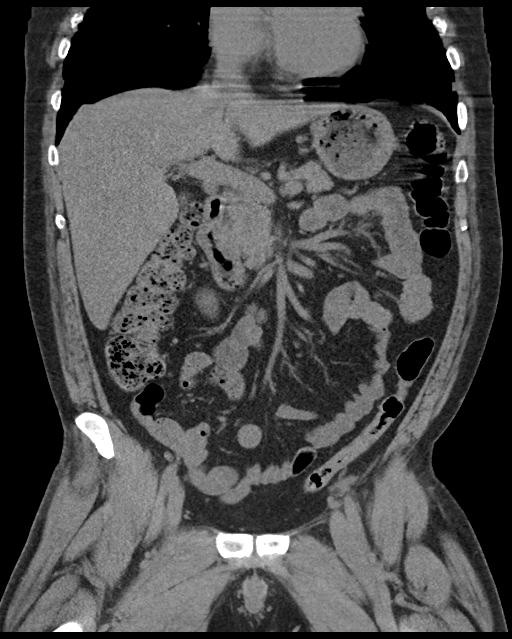
[im 51/91  soft-tissue]
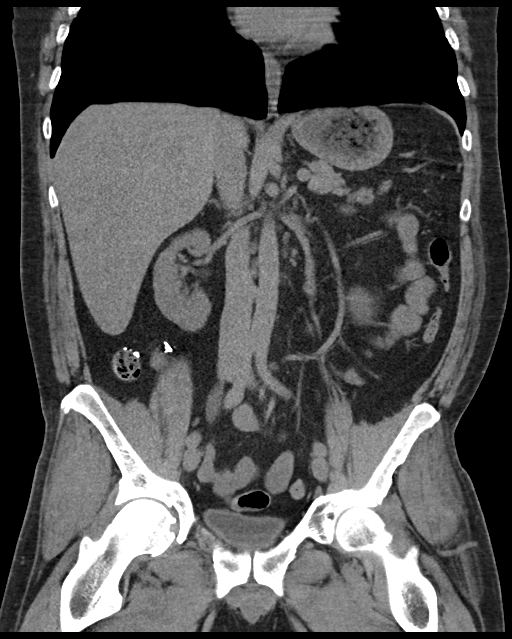

[16 of 46 positions shown; findings below may reference images not displayed]

FINDINGS: Lower chest: No acute abnormality.

Hepatobiliary: No focal liver abnormality is seen. No gallstones,
gallbladder wall thickening, or biliary dilatation.

Pancreas: Unremarkable. No pancreatic ductal dilatation or
surrounding inflammatory changes.

Spleen: Normal in size without focal abnormality.

Adrenals/Urinary Tract: Adrenal glands are unremarkable. Kidneys are
normal, without renal calculi, focal lesion, or hydronephrosis.
Bladder is unremarkable.

Stomach/Bowel: The stomach and small bowel are normal. The colon is
normal. The patient is status post appendectomy.

Vascular/Lymphatic: No significant vascular findings are present. No
enlarged abdominal or pelvic lymph nodes.

Reproductive: Prostate is unremarkable.

Other: No abdominal wall hernia or abnormality. No abdominopelvic
ascites.

Musculoskeletal: No acute or significant osseous findings.
IMPRESSION: No cause for the patient's symptoms identified. No renal stones or
obstruction. No acute abnormality.

## 2017-06-09 ENCOUNTER — Emergency Department (HOSPITAL_COMMUNITY): Payer: Medicare PPO

## 2017-06-09 ENCOUNTER — Encounter (HOSPITAL_COMMUNITY): Payer: Self-pay | Admitting: Emergency Medicine

## 2017-06-09 ENCOUNTER — Emergency Department (HOSPITAL_COMMUNITY)
Admission: EM | Admit: 2017-06-09 | Discharge: 2017-06-09 | Disposition: A | Discharge status: Home / Self Care | Payer: Medicare PPO | Attending: Emergency Medicine | Admitting: Emergency Medicine

## 2017-06-09 DIAGNOSIS — M549 Dorsalgia, unspecified: Secondary | ICD-10-CM | POA: Insufficient documentation

## 2017-06-09 DIAGNOSIS — F17210 Nicotine dependence, cigarettes, uncomplicated: Secondary | ICD-10-CM | POA: Diagnosis not present

## 2017-06-09 DIAGNOSIS — S8992XA Unspecified injury of left lower leg, initial encounter: Secondary | ICD-10-CM | POA: Diagnosis present

## 2017-06-09 DIAGNOSIS — R509 Fever, unspecified: Secondary | ICD-10-CM | POA: Insufficient documentation

## 2017-06-09 DIAGNOSIS — S81802A Unspecified open wound, left lower leg, initial encounter: Secondary | ICD-10-CM

## 2017-06-09 DIAGNOSIS — Y9389 Activity, other specified: Secondary | ICD-10-CM | POA: Diagnosis not present

## 2017-06-09 DIAGNOSIS — Y92410 Unspecified street and highway as the place of occurrence of the external cause: Secondary | ICD-10-CM | POA: Diagnosis not present

## 2017-06-09 DIAGNOSIS — Y999 Unspecified external cause status: Secondary | ICD-10-CM | POA: Insufficient documentation

## 2017-06-09 DIAGNOSIS — M25572 Pain in left ankle and joints of left foot: Secondary | ICD-10-CM | POA: Insufficient documentation

## 2017-06-09 LAB — CBC WITH DIFFERENTIAL/PLATELET
Basophils Absolute: 0.1 10*3/uL (ref 0.0–0.1)
Basophils Relative: 0 %
Eosinophils Absolute: 0.2 10*3/uL (ref 0.0–0.7)
Eosinophils Relative: 1 %
HCT: 44.7 % (ref 39.0–52.0)
Hemoglobin: 15.5 g/dL (ref 13.0–17.0)
LYMPHS PCT: 20 %
Lymphs Abs: 2.5 10*3/uL (ref 0.7–4.0)
MCH: 31 pg (ref 26.0–34.0)
MCHC: 34.7 g/dL (ref 30.0–36.0)
MCV: 89.4 fL (ref 78.0–100.0)
MONO ABS: 1.1 10*3/uL — AB (ref 0.1–1.0)
MONOS PCT: 9 %
NEUTROS ABS: 8.9 10*3/uL — AB (ref 1.7–7.7)
Neutrophils Relative %: 70 %
Platelets: 212 10*3/uL (ref 150–400)
RBC: 5 MIL/uL (ref 4.22–5.81)
RDW: 12.9 % (ref 11.5–15.5)
WBC: 12.6 10*3/uL — ABNORMAL HIGH (ref 4.0–10.5)

## 2017-06-09 LAB — URINALYSIS, ROUTINE W REFLEX MICROSCOPIC
Bilirubin Urine: NEGATIVE
GLUCOSE, UA: NEGATIVE mg/dL
Hgb urine dipstick: NEGATIVE
KETONES UR: 5 mg/dL — AB
Leukocytes, UA: NEGATIVE
Nitrite: NEGATIVE
PH: 6 (ref 5.0–8.0)
Protein, ur: NEGATIVE mg/dL
Specific Gravity, Urine: 1.014 (ref 1.005–1.030)

## 2017-06-09 LAB — BASIC METABOLIC PANEL
ANION GAP: 6 (ref 5–15)
BUN: 10 mg/dL (ref 6–20)
CALCIUM: 8.6 mg/dL — AB (ref 8.9–10.3)
CO2: 30 mmol/L (ref 22–32)
Chloride: 102 mmol/L (ref 101–111)
Creatinine, Ser: 0.9 mg/dL (ref 0.61–1.24)
GFR calc Af Amer: >60 mL/min (ref >60)
GFR calc non Af Amer: >60 mL/min (ref >60)
GLUCOSE: 115 mg/dL — AB (ref 65–99)
Potassium: 3.6 mmol/L (ref 3.5–5.1)
Sodium: 138 mmol/L (ref 135–145)

## 2017-06-09 MED ORDER — METHOCARBAMOL 500 MG PO TABS
500.0000 mg | ORAL_TABLET | Freq: Once | ORAL | Status: AC
  Administered 2017-06-09: 500 mg via ORAL
  Filled 2017-06-09: qty 1

## 2017-06-09 MED ORDER — IBUPROFEN 200 MG PO TABS
600.0000 mg | ORAL_TABLET | Freq: Once | ORAL | Status: AC
  Administered 2017-06-09: 600 mg via ORAL
  Filled 2017-06-09: qty 3

## 2017-06-09 MED ORDER — CLINDAMYCIN PHOSPHATE 300 MG/50ML IV SOLN
300.0000 mg | Freq: Once | INTRAVENOUS | Status: AC
  Administered 2017-06-09: 300 mg via INTRAVENOUS
  Filled 2017-06-09: qty 50

## 2017-06-09 MED ORDER — BACITRACIN ZINC 500 UNIT/GM EX OINT
TOPICAL_OINTMENT | CUTANEOUS | Status: AC
  Administered 2017-06-09: 1
  Filled 2017-06-09: qty 0.9

## 2017-06-09 MED ORDER — DOXYCYCLINE HYCLATE 100 MG PO CAPS: 100.0000 mg | ORAL_CAPSULE | Freq: Two times a day (BID) | ORAL | 0 refills | Status: AC
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
## 2017-06-09 NOTE — Discharge Instructions (Signed)
Keep the wound clean and dry for the first 24 hours. After that you may gently clean the wound with soap and water. Make sure to pat dry the wound before covering it with any dressing. You can use topical antibiotic ointment and bandage. Ice and elevate for pain relief.   Take antibiotics as directed. Please take all of your antibiotics until finished.  Monitor closely for any signs of infection. Return to the Emergency Department for any worsening redness/swelling of the area that begins to spread, drainage from the site, worsening pain, fever or any other worsening or concerning symptoms.   Return the emergency Department for any worsening pain, redness and swelling of the leg, difficulty walking, chest pain, difficulty breathing, fever or any other worsening or concerning symptoms.
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
## 2017-06-09 NOTE — ED Notes (Signed)
Patient transported to CT 

## 2017-08-04 ENCOUNTER — Ambulatory Visit: Payer: Medicare PPO | Admitting: Sports Medicine

## 2017-08-27 ENCOUNTER — Emergency Department (HOSPITAL_COMMUNITY): Payer: Medicare PPO

## 2017-08-27 ENCOUNTER — Other Ambulatory Visit: Payer: Self-pay

## 2017-08-27 ENCOUNTER — Emergency Department (HOSPITAL_COMMUNITY)
Admission: EM | Admit: 2017-08-27 | Discharge: 2017-08-28 | Disposition: A | Discharge status: Home / Self Care | Payer: Medicare PPO | Attending: Emergency Medicine | Admitting: Emergency Medicine

## 2017-08-27 ENCOUNTER — Encounter (HOSPITAL_COMMUNITY): Payer: Self-pay | Admitting: Emergency Medicine

## 2017-08-27 DIAGNOSIS — F17210 Nicotine dependence, cigarettes, uncomplicated: Secondary | ICD-10-CM | POA: Insufficient documentation

## 2017-08-27 DIAGNOSIS — Z880 Allergy status to penicillin: Secondary | ICD-10-CM | POA: Insufficient documentation

## 2017-08-27 DIAGNOSIS — Y999 Unspecified external cause status: Secondary | ICD-10-CM | POA: Diagnosis not present

## 2017-08-27 DIAGNOSIS — M25531 Pain in right wrist: Secondary | ICD-10-CM | POA: Insufficient documentation

## 2017-08-27 DIAGNOSIS — Y939 Activity, unspecified: Secondary | ICD-10-CM | POA: Insufficient documentation

## 2017-08-27 DIAGNOSIS — Z79899 Other long term (current) drug therapy: Secondary | ICD-10-CM | POA: Insufficient documentation

## 2017-08-27 DIAGNOSIS — M79644 Pain in right finger(s): Secondary | ICD-10-CM | POA: Diagnosis not present

## 2017-08-27 DIAGNOSIS — W010XXA Fall on same level from slipping, tripping and stumbling without subsequent striking against object, initial encounter: Secondary | ICD-10-CM | POA: Diagnosis not present

## 2017-08-27 DIAGNOSIS — Y929 Unspecified place or not applicable: Secondary | ICD-10-CM | POA: Insufficient documentation

## 2017-08-27 MED ORDER — KETOROLAC TROMETHAMINE 60 MG/2ML IM SOLN
60.0000 mg | Freq: Once | INTRAMUSCULAR | Status: AC
  Administered 2017-08-27: 60 mg via INTRAMUSCULAR
  Filled 2017-08-27: qty 2

## 2017-08-27 MED ORDER — OXYCODONE-ACETAMINOPHEN 5-325 MG PO TABS
ORAL_TABLET | ORAL | Status: AC
  Filled 2017-08-27: qty 1

## 2017-08-27 MED ORDER — MELOXICAM 15 MG PO TABS: 15.0000 mg | ORAL_TABLET | Freq: Every day | ORAL | 0 refills | Status: DC

## 2017-08-27 MED ORDER — OXYCODONE-ACETAMINOPHEN 5-325 MG PO TABS
1.0000 | ORAL_TABLET | Freq: Once | ORAL | Status: AC
  Administered 2017-08-27: 1 via ORAL

## 2017-08-27 NOTE — ED Notes (Signed)
ED Provider at bedside. 
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
## 2017-08-27 NOTE — ED Notes (Signed)
Paged Ortho for placement of a thumb spica to R arm. Ortho Tech aware and en route.

## 2017-08-27 NOTE — ED Triage Notes (Signed)
Pt fell yesterday on concrete, he was trying to take care at home for his arm, taking tramadol at home for pain last time today at 2p, c/o 10/10 pain arms looks swollen on triage. Arm skin warm to touch.

## 2017-08-27 NOTE — ED Notes (Signed)
Patient transported to X-ray 

## 2017-08-27 NOTE — Progress Notes (Signed)
Orthopedic Tech Progress Note Patient Details:  [name] ShortsLarry H Pierce [birth year] [id number]  Ortho Devices Type of Ortho Device: Thumb spica splint, Arm sling Splint Material: Fiberglass Ortho Device/Splint Location: rue Ortho Device/Splint Interventions: Ordered, Application, Adjustment   [name] PostMartinez, [name] 08/27/2017, 11:46 PM

## 2017-08-27 NOTE — Discharge Instructions (Addendum)
He was seen here today for right wrist pain after fall.  Your x-rays were without evidence of fracture or dislocation, however your exam was concerning for something called a scaphoid injury .  For this reason I have placed you in a thumb spica splint.  I would like you to follow-up with an hand specialist for further evaluation.  Follow these instructions at home: If you have a cast: Do not stick anything inside the cast to scratch your skin. Doing that increases your risk of infection. Check the skin around the cast every day. Report any concerns to your health care provider. You may put lotion on dry skin around the edges of the cast. Do not apply lotion to the skin underneath the cast. Do not let your cast get wet if it is not waterproof. Keep the cast clean. If you have a splint: Wear the splint as told by your health care provider. Remove it only as told by your health care provider. Loosen the splint if your fingers tingle, become numb, or turn cold and blue. Do not let your splint get wet if it is not waterproof. Keep the splint clean. Bathing Do not take baths, swim, or use a hot tub until your health care provider approves. Ask your health care provider if you can take showers. You may only be allowed to take sponge baths for bathing. If your cast or splint is not waterproof, cover it with a watertight plastic bag when you take a bath or a shower. Managing pain, stiffness, and swelling If directed, apply ice to the injured area. Put ice in a plastic bag. Place a towel between your skin and the bag. Leave the ice on for 20 minutes, 2-3 times per day. Move your fingers often to avoid stiffness and to lessen swelling. Raise (elevate) the injured area above the level of your heart while you are sitting or lying down. Driving Do not drive or operate heavy machinery while taking prescription pain medicine. Ask your health care provider when it is safe to drive if you have a cast or  splint on a hand that you use for driving. Activity Return to your normal activities as told by your health care provider. Ask your health care provider what activities are safe for you. You may need to limit activities such as contact sports, throwing, pushing, climbing, and using vibrating machinery. Do not lift anything that is heavier than 1 lb (0.5 kg) with the affected hand until your health care provider tells you that it is safe. Do exercises only as told by your health care provider. General instructions Do not put pressure on any part of the cast or splint until it is fully hardened. This may take several hours. Take over-the-counter and prescription medicines only as told by your health care provider. Do not use any tobacco products, including cigarettes, chewing tobacco, or e-cigarettes. Tobacco can delay bone healing. If you need help quitting, ask your health care provider. Keep all follow-up visits as told by your health care provider. This is important. Contact a health care provider if: Your pain or swelling gets worse even though you have had treatment. You have pain, numbness, or coldness in your hand or fingers. Your cast or splint becomes loose or damaged. Return if: You lose feeling in your hand or fingers. Your fingers or fingernails turn pale or blue.

## 2017-09-01 ENCOUNTER — Encounter (INDEPENDENT_AMBULATORY_CARE_PROVIDER_SITE_OTHER): Payer: Self-pay | Admitting: Orthopaedic Surgery

## 2017-09-01 ENCOUNTER — Ambulatory Visit (INDEPENDENT_AMBULATORY_CARE_PROVIDER_SITE_OTHER): Payer: Medicaid Other | Admitting: Orthopaedic Surgery

## 2017-09-01 DIAGNOSIS — M25531 Pain in right wrist: Secondary | ICD-10-CM | POA: Insufficient documentation

## 2017-09-01 MED ORDER — IBUPROFEN 800 MG PO TABS: 800.0000 mg | ORAL_TABLET | Freq: Three times a day (TID) | ORAL | 3 refills | Status: DC | PRN
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
## 2017-09-03 ENCOUNTER — Encounter: Payer: Self-pay | Admitting: Internal Medicine

## 2017-09-07 ENCOUNTER — Emergency Department (HOSPITAL_COMMUNITY)
Admission: EM | Admit: 2017-09-07 | Discharge: 2017-09-08 | Disposition: A | Discharge status: Other Acute Inpatient Hospital | Payer: Medicare PPO | Attending: Emergency Medicine | Admitting: Emergency Medicine

## 2017-09-07 ENCOUNTER — Encounter (HOSPITAL_COMMUNITY): Payer: Self-pay | Admitting: Emergency Medicine

## 2017-09-07 DIAGNOSIS — Z560 Unemployment, unspecified: Secondary | ICD-10-CM | POA: Diagnosis not present

## 2017-09-07 DIAGNOSIS — Z79899 Other long term (current) drug therapy: Secondary | ICD-10-CM | POA: Insufficient documentation

## 2017-09-07 DIAGNOSIS — R45851 Suicidal ideations: Secondary | ICD-10-CM | POA: Insufficient documentation

## 2017-09-07 DIAGNOSIS — F419 Anxiety disorder, unspecified: Secondary | ICD-10-CM | POA: Diagnosis not present

## 2017-09-07 DIAGNOSIS — F1220 Cannabis dependence, uncomplicated: Secondary | ICD-10-CM | POA: Diagnosis present

## 2017-09-07 DIAGNOSIS — L03113 Cellulitis of right upper limb: Secondary | ICD-10-CM

## 2017-09-07 DIAGNOSIS — F1120 Opioid dependence, uncomplicated: Secondary | ICD-10-CM | POA: Diagnosis present

## 2017-09-07 DIAGNOSIS — F332 Major depressive disorder, recurrent severe without psychotic features: Secondary | ICD-10-CM | POA: Diagnosis not present

## 2017-09-07 DIAGNOSIS — Z046 Encounter for general psychiatric examination, requested by authority: Secondary | ICD-10-CM | POA: Insufficient documentation

## 2017-09-07 DIAGNOSIS — Z599 Problem related to housing and economic circumstances, unspecified: Secondary | ICD-10-CM | POA: Diagnosis not present

## 2017-09-07 DIAGNOSIS — F17210 Nicotine dependence, cigarettes, uncomplicated: Secondary | ICD-10-CM | POA: Insufficient documentation

## 2017-09-07 DIAGNOSIS — F329 Major depressive disorder, single episode, unspecified: Secondary | ICD-10-CM | POA: Diagnosis present

## 2017-09-07 DIAGNOSIS — R4587 Impulsiveness: Secondary | ICD-10-CM | POA: Diagnosis not present

## 2017-09-07 LAB — CBC WITH DIFFERENTIAL/PLATELET
BASOS ABS: 0 10*3/uL (ref 0.0–0.1)
BASOS PCT: 0 %
EOS ABS: 0.2 10*3/uL (ref 0.0–0.7)
Eosinophils Relative: 1 %
HCT: 47.3 % (ref 39.0–52.0)
HEMOGLOBIN: 16.4 g/dL (ref 13.0–17.0)
LYMPHS ABS: 3.1 10*3/uL (ref 0.7–4.0)
LYMPHS PCT: 19 %
MCH: 31.2 pg (ref 26.0–34.0)
MCHC: 34.7 g/dL (ref 30.0–36.0)
MCV: 89.9 fL (ref 78.0–100.0)
MONO ABS: 2.1 10*3/uL — AB (ref 0.1–1.0)
Monocytes Relative: 13 %
NEUTROS ABS: 10.8 10*3/uL — AB (ref 1.7–7.7)
Neutrophils Relative %: 67 %
Platelets: 315 10*3/uL (ref 150–400)
RBC: 5.26 MIL/uL (ref 4.22–5.81)
RDW: 14.7 % (ref 11.5–15.5)
WBC: 16.2 10*3/uL — ABNORMAL HIGH (ref 4.0–10.5)

## 2017-09-07 LAB — COMPREHENSIVE METABOLIC PANEL
ALBUMIN: 4.1 g/dL (ref 3.5–5.0)
ALK PHOS: 98 U/L (ref 38–126)
ALT: 17 U/L (ref 17–63)
ANION GAP: 7 (ref 5–15)
AST: 22 U/L (ref 15–41)
BILIRUBIN TOTAL: 0.7 mg/dL (ref 0.3–1.2)
BUN: 15 mg/dL (ref 6–20)
CALCIUM: 8.5 mg/dL — AB (ref 8.9–10.3)
CO2: 26 mmol/L (ref 22–32)
CREATININE: 0.88 mg/dL (ref 0.61–1.24)
Chloride: 104 mmol/L (ref 101–111)
GFR calc Af Amer: >60 mL/min (ref >60)
GFR calc non Af Amer: >60 mL/min (ref >60)
GLUCOSE: 93 mg/dL (ref 65–99)
Potassium: 3.4 mmol/L — ABNORMAL LOW (ref 3.5–5.1)
SODIUM: 137 mmol/L (ref 135–145)
TOTAL PROTEIN: 7.9 g/dL (ref 6.5–8.1)

## 2017-09-07 MED ORDER — CLONAZEPAM 1 MG PO TABS
1.0000 mg | ORAL_TABLET | Freq: Two times a day (BID) | ORAL | Status: DC
  Administered 2017-09-07 – 2017-09-08 (×2): 1 mg via ORAL
  Filled 2017-09-07: qty 1
  Filled 2017-09-07: qty 2

## 2017-09-07 MED ORDER — DOXYCYCLINE HYCLATE 100 MG PO TABS
100.0000 mg | ORAL_TABLET | Freq: Two times a day (BID) | ORAL | Status: DC
  Administered 2017-09-07 – 2017-09-08 (×2): 100 mg via ORAL
  Filled 2017-09-07 (×2): qty 1

## 2017-09-07 MED ORDER — PREGABALIN 50 MG PO CAPS
300.0000 mg | ORAL_CAPSULE | Freq: Two times a day (BID) | ORAL | Status: DC
  Administered 2017-09-07 – 2017-09-08 (×2): 300 mg via ORAL
  Filled 2017-09-07 (×2): qty 6

## 2017-09-07 MED ORDER — PANTOPRAZOLE SODIUM 40 MG PO TBEC
40.0000 mg | DELAYED_RELEASE_TABLET | Freq: Every day | ORAL | Status: DC
  Administered 2017-09-07 – 2017-09-08 (×2): 40 mg via ORAL
  Filled 2017-09-07 (×2): qty 1

## 2017-09-07 MED ORDER — DOXYCYCLINE HYCLATE 100 MG PO TABS: 100.0000 mg | ORAL_TABLET | Freq: Once | ORAL | Status: DC

## 2017-09-07 MED ORDER — IBUPROFEN 200 MG PO TABS
600.0000 mg | ORAL_TABLET | Freq: Three times a day (TID) | ORAL | Status: DC | PRN
  Administered 2017-09-08: 600 mg via ORAL
  Filled 2017-09-07: qty 3

## 2017-09-07 MED ORDER — OXYCODONE HCL 5 MG PO TABS
15.0000 mg | ORAL_TABLET | Freq: Four times a day (QID) | ORAL | Status: DC
  Administered 2017-09-07 – 2017-09-08 (×4): 15 mg via ORAL
  Filled 2017-09-07 (×4): qty 3

## 2017-09-07 MED ORDER — GABAPENTIN 300 MG PO CAPS: 600.0000 mg | ORAL_CAPSULE | Freq: Every day | ORAL | Status: DC
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
## 2017-09-07 NOTE — ED Triage Notes (Signed)
Patient reports "I was self medicating yesterday and I missed." Reports [facility] the [facility] time in 13 months to treat arm pain after a fall. Swelling and warmth noted to right arm. Reports last use today at 1400.

## 2017-09-08 DIAGNOSIS — Z560 Unemployment, unspecified: Secondary | ICD-10-CM

## 2017-09-08 DIAGNOSIS — F1020 Alcohol dependence, uncomplicated: Secondary | ICD-10-CM

## 2017-09-08 DIAGNOSIS — F332 Major depressive disorder, recurrent severe without psychotic features: Secondary | ICD-10-CM

## 2017-09-08 DIAGNOSIS — R4587 Impulsiveness: Secondary | ICD-10-CM

## 2017-09-08 DIAGNOSIS — Z599 Problem related to housing and economic circumstances, unspecified: Secondary | ICD-10-CM

## 2017-09-08 LAB — RAPID URINE DRUG SCREEN, HOSP PERFORMED
AMPHETAMINES: NOT DETECTED
BARBITURATES: NOT DETECTED
BENZODIAZEPINES: NOT DETECTED
Cocaine: POSITIVE — AB
OPIATES: POSITIVE — AB
TETRAHYDROCANNABINOL: POSITIVE — AB

## 2017-09-08 LAB — ACETAMINOPHEN LEVEL

## 2017-09-08 LAB — ETHANOL: Alcohol, Ethyl (B): <10 mg/dL (ref <10)

## 2017-09-08 LAB — SALICYLATE LEVEL: Salicylate Lvl: <7 mg/dL (ref 2.8–30.0)

## 2017-09-08 MED ORDER — NICOTINE 21 MG/24HR TD PT24
21.0000 mg | MEDICATED_PATCH | Freq: Every day | TRANSDERMAL | Status: DC
  Administered 2017-09-08: 21 mg via TRANSDERMAL
  Filled 2017-09-08: qty 1

## 2017-09-08 MED ORDER — ONDANSETRON HCL 4 MG PO TABS
4.0000 mg | ORAL_TABLET | Freq: Three times a day (TID) | ORAL | Status: DC | PRN
  Administered 2017-09-08: 4 mg via ORAL
  Filled 2017-09-08: qty 1

## 2017-09-08 MED ORDER — ALUM & MAG HYDROXIDE-SIMETH 200-200-20 MG/5ML PO SUSP: 30.0000 mL | Freq: Four times a day (QID) | ORAL | Status: DC | PRN

## 2017-09-08 NOTE — Patient Outreach (Signed)
ED Peer Support Specialist Patient Intake (Complete at intake & 30-60 Day Follow-up)  Name: [name] ShortsLarry H Latendresse  MRN: [id number]  Age: 41 y.o.   Date of Admission: 09/08/2017  Intake: Initial Comments:      Primary Reason Admitted:  41 year old male presents with right arm pain and redness. Started yesterday shortly after shooting heroin. This is a relapse for him, had been clean 13 months. Did it because he was in such severe pain from a right wrist/hand injury suffered 1-2 weeks ago. Is chronically on 15 mg oxycodone.Since shooting up he has developed right arm redness and warmth. He denies any fevers. He states that in addition to this he is also feeling suicidal because of his relapse and knows that he will relapse again and wants detox.  Lab values: Alcohol/ETOH: Negative Positive UDS? Yes Amphetamines: No Barbiturates: No Benzodiazepines: No Cocaine: Yes Opiates: Yes Cannabinoids: Yes  Demographic information: Gender: Male Ethnicity: White Marital Status: Divorced Insurance Status: Medicare([employer] Medicare) Control and instrumentation engineereceives non-medical governmental assistance (Work Engineer, agriculturalirst/Welfare, Sales executivefood stamps, etc.: No Lives with: Alone Living situation: Homeless([facility])  Reported Patient History: Patient reported health conditions: Depression, Other (comment)(Anxiety) Patient aware of HIV and hepatitis status: Yes (comment)(Hepatitis C)  In past year, has patient visited ED for any reason? Yes  Number of ED visits: 1  Reason(s) for visit: September for leg injury  In past year, has patient been hospitalized for any reason? Yes  Number of hospitalizations: 1  Reason(s) for hospitalization: December of 2017 for liver problems   In past year, has patient been arrested? No  Number of arrests:    Reason(s) for arrest:    In past year, has patient been incarcerated? No  Number of incarcerations:    Reason(s) for incarceration:    In past year, has patient received  medication-assisted treatment? Yes, Other (comment)(Suboxone clinic)  In past year, patient received the following treatments: Peer support group(Daily AA meetings)  In past year, has patient received any harm reduction services? No  Did this include any of the following?    In past year, has patient received care from a mental health provider for diagnosis other than SUD? No  In past year, is this first time patient has overdosed? (has not overdosed this year)  Number of past overdoses:    In past year, is this first time patient has been hospitalized for an overdose? (has not overdosed this year)  Number of hospitalizations for overdose(s):    Is patient currently receiving treatment for a mental health diagnosis? No  Patient reports experiencing difficulty participating in SUD treatment: No    Most important reason(s) for this difficulty?    Has patient received prior services for treatment? Yes(Patient has been attending daily AA meetings in the past year and relapsed recently leading to being [facility]. [facility] and [facility] the past.  )  In past, patient has received services from following agencies: ([facility]stance use treatment. )  Plan of Care:  Suggested follow up at these agencies/treatment centers: (Patient has [facility] in [place] for detox/substance use treatment. )  Other information: CPSS plan to follow up with the patient after he receives treatment [facility].     Bartholomew BoardsJohn Reene Harlacher, CPSS  09/08/2017 3:14 PM

## 2017-09-08 NOTE — ED Notes (Signed)
Pt with polysubstance abuse presents with feelings of hopelessness, depression and anxiety.  Pt last injected Heroin at 1400 yesterday.  A&O x 3, no distress noted, calm & cooperative.  Monitoring for safety, Q 15 min checks in effect.  Safety check for contraband completed, no items found.  Splint to right wrist in place, metal removed.  Pt admits to passive SI, denies HI or AVH.

## 2017-09-08 NOTE — Discharge Instructions (Signed)
For your behavioral health needs, you are advised to follow up with [facility].  Contact them at your earliest opportunity to ask about scheduling an intake appointment:       [facility]      [address]      [place], [place] [address]      [phone]   For your shelter needs, contact the following service providers:       [facility]      [address]      [place], [place] [address]      [phone]       [facility]      [address]      [place], [place] [address]      [phone]  For day shelter and other supportive services for the homeless, contact the [facility]):       [facility]      [address]      [place], [place] [address]      [phone]  For transitional housing, contact one of the following agencies.  They provide longer term housing than a shelter, but there is an application process:       [facility]      [address]      [place], [place] [address]      [phone]       [facility]      [address]      [place], [place] [address]      [phone]

## 2017-09-08 NOTE — ED Notes (Signed)
[facility] called for transport. 

## 2017-09-08 NOTE — Progress Notes (Signed)
09/08/17 1359:  Pt was just starting to eat lunch.  LRT introduced self and informed pt that activities were available when he finished eating.  Pt was receptive but did not participate in activities.    Caroll RancherMarjette Madalin Hughart, LRT/CTRS

## 2017-09-08 NOTE — ED Notes (Signed)
Patient is in scrubs and belongings in bag.

## 2017-09-08 NOTE — BHH Suicide Risk Assessment (Signed)
Suicide Risk Assessment  Discharge Assessment   [facility]   Principal Problem: MDD (major depressive disorder), recurrent episode, severe (HCC) Discharge Diagnoses:  Patient Active Problem List   Diagnosis Date Noted  . Pain in right wrist [M25.531] 09/01/2017  . Major depressive disorder, recurrent severe without psychotic features (HCC) [F33.2] 12/08/2016  . Acute hepatitis [B17.9] 10/04/2016  . Itching [L29.9] 10/04/2016  . Hepatitis C [B19.20] 07/12/2016  . Cannabis use disorder, severe, dependence (HCC) [F12.20] 07/10/2016  . Cocaine use disorder, severe, dependence (HCC) [F14.20] 07/10/2016  . Opioid use disorder, severe, dependence (HCC) [F11.20] 07/10/2016  . Alcohol use disorder, severe, dependence (HCC) [F10.20] 07/10/2016  . Tobacco use disorder [F17.200] 07/10/2016  . MDD (major depressive disorder), recurrent episode, severe (HCC) [F33.2] 07/09/2016  . Chronic back pain [M54.9, G89.29] 03/01/2013  . 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 GERD (gastroesophageal reflux disease) [K21.9] 03/01/2013   Pt was seen and chart reviewed with treatment team and Dr Sharma CovertNorman. Pt stated he has been sober for 10 months and relapsed which caused him to have suicidal thoughts. Pt today stated he knows what he has to do to get sober and wants detox and rehab. Pt will be seen by Peer Support specialist for additional help with community resources for substance abuse treatment. Pt has an AA sponsor and "just wants to get back on track." Pt is stable and psychiatrically clear for discharge.   Total Time spent with patient: 45 minutes  Musculoskeletal: Strength & Muscle Tone: within normal limits Gait & Station: normal Patient leans: N/A  Psychiatric Specialty Exam:   Blood pressure 115/61, pulse 79, temperature 97.8 F (36.6 C), temperature source Oral, resp. rate 17, SpO2 96 %.There is no height or weight on file to calculate BMI.  General  Appearance: Disheveled  Eye Contact::  Good  Speech:  Clear and Coherent409  Volume:  Normal  Mood:  Depressed  Affect:  Congruent and Depressed  Thought Process:  Coherent, Goal Directed and Linear  Orientation:  Full (Time, Place, and Person)  Thought Content:  Logical  Suicidal Thoughts:  No  Homicidal Thoughts:  No  Memory:  Immediate;   Good Recent;   Good Remote;   Fair  Judgement:  Fair  Insight:  Present  Psychomotor Activity:  Normal  Concentration:  Good  Recall:  Good  Fund of Knowledge:Good  Language: Good  Akathisia:  No  Handed:  Right  AIMS (if indicated):     Assets:  Communication Skills Desire for Improvement Financial Resources/Insurance Resilience Social Support  Sleep:     Cognition: WNL  ADL's:  Intact   Mental Status Per Nursing Assessment::   On Admission:     Demographic Factors:  Male, Caucasian, Low socioeconomic status and Unemployed  Loss Factors: Financial problems/change in socioeconomic status  Historical Factors: Impulsivity  Risk Reduction Factors:   Sense of responsibility to family and Positive social support  Continued Clinical Symptoms:  Depression:   Impulsivity Alcohol/Substance Abuse/Dependencies  Cognitive Features That Contribute To Risk:  Closed-mindedness    Suicide Risk:  Minimal: No identifiable suicidal ideation.  Patients presenting with no risk factors but with morbid ruminations; may be classified as minimal risk based on the severity of the depressive symptoms    Plan Of Care/Follow-up recommendations:  Activity:  as tolerated Diet:  Heart Healthy  Laveda AbbeLaurie Britton Lumen [place], NP 09/08/2017, 10:46 AM

## 2017-09-08 NOTE — BH Assessment (Signed)
[facility]  Per Juanetta BeetsJacqueline Norman, DO, this pt does not require psychiatric hospitalization at this time.  [facility].  Pt is also to be provided with information about supportive services for the homeless.  These have been included in pt's discharge instructions.  Pt would also benefit from seeing Peer Support Specialists; they will be asked to speak to pt.  Pt's nurse, Carlisle BeersLuann, has been notified.  Doylene Canninghomas Jumaane Weatherford, MA Triage Specialist [phone]

## 2017-09-08 NOTE — Patient Outreach (Signed)
CPSS sent referral [facility] for [facility]. [facility].

## 2017-09-08 NOTE — Patient Outreach (Signed)
[name] HedgesLarry Pierce was accepted [facility] use treatment. The accepting physician is Melchor Amourngie Kim-Capone and the number to call for report is [phone]. Patient will need [facility] to transport [facility] [place].
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
## 2017-09-14 ENCOUNTER — Encounter: Payer: Self-pay | Admitting: Internal Medicine

## 2017-10-06 HISTORY — PX: CATARACT EXTRACTION: SUR2

## 2017-11-17 ENCOUNTER — Telehealth (HOSPITAL_COMMUNITY): Payer: Self-pay

## 2017-11-17 ENCOUNTER — Ambulatory Visit (HOSPITAL_COMMUNITY): Payer: Self-pay

## 2018-01-05 ENCOUNTER — Encounter: Payer: Medicare PPO | Admitting: Sports Medicine

## 2018-01-05 NOTE — Progress Notes (Signed)
Patient was a NO SHOW This encounter was created in error - please disregard. 

## 2018-01-26 ENCOUNTER — Ambulatory Visit: Payer: Self-pay | Admitting: Internal Medicine

## 2018-03-03 ENCOUNTER — Encounter (HOSPITAL_COMMUNITY): Payer: Self-pay | Admitting: Emergency Medicine

## 2018-03-03 ENCOUNTER — Inpatient Hospital Stay (HOSPITAL_COMMUNITY)
Admission: EM | Admit: 2018-03-03 | Discharge: 2018-03-05 | DRG: 602 | Disposition: A | Discharge status: Home / Self Care | Payer: Medicare PPO | Attending: Internal Medicine | Admitting: Internal Medicine

## 2018-03-03 DIAGNOSIS — K219 Gastro-esophageal reflux disease without esophagitis: Secondary | ICD-10-CM | POA: Diagnosis present

## 2018-03-03 DIAGNOSIS — Z79899 Other long term (current) drug therapy: Secondary | ICD-10-CM

## 2018-03-03 DIAGNOSIS — Z7951 Long term (current) use of inhaled steroids: Secondary | ICD-10-CM | POA: Diagnosis not present

## 2018-03-03 DIAGNOSIS — Z8711 Personal history of peptic ulcer disease: Secondary | ICD-10-CM | POA: Diagnosis not present

## 2018-03-03 DIAGNOSIS — Z880 Allergy status to penicillin: Secondary | ICD-10-CM

## 2018-03-03 DIAGNOSIS — L03116 Cellulitis of left lower limb: Secondary | ICD-10-CM | POA: Diagnosis present

## 2018-03-03 DIAGNOSIS — F17200 Nicotine dependence, unspecified, uncomplicated: Secondary | ICD-10-CM | POA: Diagnosis not present

## 2018-03-03 DIAGNOSIS — B1920 Unspecified viral hepatitis C without hepatic coma: Secondary | ICD-10-CM | POA: Diagnosis present

## 2018-03-03 DIAGNOSIS — B951 Streptococcus, group B, as the cause of diseases classified elsewhere: Secondary | ICD-10-CM | POA: Diagnosis present

## 2018-03-03 DIAGNOSIS — F1910 Other psychoactive substance abuse, uncomplicated: Secondary | ICD-10-CM | POA: Diagnosis present

## 2018-03-03 DIAGNOSIS — F17210 Nicotine dependence, cigarettes, uncomplicated: Secondary | ICD-10-CM | POA: Diagnosis present

## 2018-03-03 DIAGNOSIS — L03119 Cellulitis of unspecified part of limb: Secondary | ICD-10-CM

## 2018-03-03 DIAGNOSIS — L02414 Cutaneous abscess of left upper limb: Principal | ICD-10-CM | POA: Diagnosis present

## 2018-03-03 DIAGNOSIS — G9341 Metabolic encephalopathy: Secondary | ICD-10-CM | POA: Diagnosis not present

## 2018-03-03 DIAGNOSIS — L03114 Cellulitis of left upper limb: Secondary | ICD-10-CM | POA: Diagnosis present

## 2018-03-03 DIAGNOSIS — F1420 Cocaine dependence, uncomplicated: Secondary | ICD-10-CM | POA: Diagnosis present

## 2018-03-03 DIAGNOSIS — G629 Polyneuropathy, unspecified: Secondary | ICD-10-CM | POA: Diagnosis present

## 2018-03-03 LAB — URINALYSIS, ROUTINE W REFLEX MICROSCOPIC
Bilirubin Urine: NEGATIVE
Glucose, UA: NEGATIVE mg/dL
Hgb urine dipstick: NEGATIVE
Ketones, ur: NEGATIVE mg/dL
Leukocytes, UA: NEGATIVE
NITRITE: NEGATIVE
PH: 6 (ref 5.0–8.0)
Protein, ur: NEGATIVE mg/dL
SPECIFIC GRAVITY, URINE: 1.004 — AB (ref 1.005–1.030)

## 2018-03-03 LAB — BASIC METABOLIC PANEL
Anion gap: 11 (ref 5–15)
BUN: 8 mg/dL (ref 6–20)
CHLORIDE: 96 mmol/L — AB (ref 101–111)
CO2: 26 mmol/L (ref 22–32)
Calcium: 8.8 mg/dL — ABNORMAL LOW (ref 8.9–10.3)
Creatinine, Ser: 0.99 mg/dL (ref 0.61–1.24)
GFR calc non Af Amer: >60 mL/min (ref >60)
Glucose, Bld: 106 mg/dL — ABNORMAL HIGH (ref 65–99)
POTASSIUM: 3.9 mmol/L (ref 3.5–5.1)
SODIUM: 133 mmol/L — AB (ref 135–145)

## 2018-03-03 LAB — CBC
HCT: 45.5 % (ref 39.0–52.0)
HEMOGLOBIN: 15.7 g/dL (ref 13.0–17.0)
MCH: 30.5 pg (ref 26.0–34.0)
MCHC: 34.5 g/dL (ref 30.0–36.0)
MCV: 88.5 fL (ref 78.0–100.0)
Platelets: 364 10*3/uL (ref 150–400)
RBC: 5.14 MIL/uL (ref 4.22–5.81)
RDW: 12 % (ref 11.5–15.5)
WBC: 14.2 10*3/uL — ABNORMAL HIGH (ref 4.0–10.5)

## 2018-03-03 LAB — SEDIMENTATION RATE: SED RATE: 58 mm/h — AB (ref 0–16)

## 2018-03-03 LAB — C-REACTIVE PROTEIN: CRP: 6.8 mg/dL — ABNORMAL HIGH (ref <1.0)

## 2018-03-03 LAB — I-STAT CG4 LACTIC ACID, ED: LACTIC ACID, VENOUS: 1.7 mmol/L (ref 0.5–1.9)

## 2018-03-03 MED ORDER — PREGABALIN 75 MG PO CAPS
300.0000 mg | ORAL_CAPSULE | Freq: Two times a day (BID) | ORAL | Status: DC
  Administered 2018-03-04 – 2018-03-05 (×4): 300 mg via ORAL
  Filled 2018-03-03 (×3): qty 4
  Filled 2018-03-03: qty 3

## 2018-03-03 MED ORDER — IBUPROFEN 400 MG PO TABS: 400.0000 mg | ORAL_TABLET | Freq: Four times a day (QID) | ORAL | Status: DC | PRN

## 2018-03-03 MED ORDER — SODIUM CHLORIDE 0.9 % IV SOLN
2.0000 g | Freq: Three times a day (TID) | INTRAVENOUS | Status: DC
  Administered 2018-03-03 – 2018-03-05 (×5): 2 g via INTRAVENOUS
  Filled 2018-03-03 (×6): qty 2

## 2018-03-03 MED ORDER — PANTOPRAZOLE SODIUM 40 MG PO TBEC
40.0000 mg | DELAYED_RELEASE_TABLET | Freq: Every day | ORAL | Status: DC
  Administered 2018-03-04 – 2018-03-05 (×2): 40 mg via ORAL
  Filled 2018-03-03 (×2): qty 1

## 2018-03-03 MED ORDER — LIDOCAINE HCL (PF) 1 % IJ SOLN
5.0000 mL | Freq: Once | INTRAMUSCULAR | Status: DC
  Filled 2018-03-03: qty 5

## 2018-03-03 MED ORDER — QUETIAPINE FUMARATE 100 MG PO TABS
100.0000 mg | ORAL_TABLET | Freq: Every day | ORAL | Status: DC
  Administered 2018-03-03 – 2018-03-04 (×2): 100 mg via ORAL
  Filled 2018-03-03: qty 1
  Filled 2018-03-03: qty 2
  Filled 2018-03-03: qty 1

## 2018-03-03 MED ORDER — OXYCODONE-ACETAMINOPHEN 5-325 MG PO TABS
1.0000 | ORAL_TABLET | ORAL | Status: DC | PRN
Start: 2018-03-03 — End: 2018-03-03

## 2018-03-03 MED ORDER — OXYCODONE HCL 5 MG PO TABS
15.0000 mg | ORAL_TABLET | Freq: Once | ORAL | Status: AC
  Administered 2018-03-03: 15 mg via ORAL
  Filled 2018-03-03: qty 3

## 2018-03-03 MED ORDER — AMITRIPTYLINE HCL 50 MG PO TABS
50.0000 mg | ORAL_TABLET | Freq: Every day | ORAL | Status: DC
  Administered 2018-03-03 – 2018-03-04 (×2): 50 mg via ORAL
  Filled 2018-03-03: qty 2
  Filled 2018-03-03: qty 1

## 2018-03-03 MED ORDER — SODIUM CHLORIDE 0.9 % IV SOLN
INTRAVENOUS | Status: DC
  Administered 2018-03-04 – 2018-03-05 (×2): via INTRAVENOUS

## 2018-03-03 MED ORDER — CLONAZEPAM 1 MG PO TABS
1.0000 mg | ORAL_TABLET | Freq: Two times a day (BID) | ORAL | Status: DC
  Administered 2018-03-03 – 2018-03-05 (×4): 1 mg via ORAL
  Filled 2018-03-03: qty 2
  Filled 2018-03-03 (×3): qty 1

## 2018-03-03 MED ORDER — POLYETHYLENE GLYCOL 3350 17 G PO PACK: 17.0000 g | PACK | Freq: Every day | ORAL | Status: DC | PRN

## 2018-03-03 MED ORDER — NICOTINE 21 MG/24HR TD PT24
21.0000 mg | MEDICATED_PATCH | Freq: Every day | TRANSDERMAL | Status: DC
  Administered 2018-03-03 – 2018-03-04 (×2): 21 mg via TRANSDERMAL
  Filled 2018-03-03 (×2): qty 1

## 2018-03-03 MED ORDER — OXYCODONE HCL 5 MG PO TABS
10.0000 mg | ORAL_TABLET | Freq: Four times a day (QID) | ORAL | Status: DC | PRN
  Administered 2018-03-04 (×3): 10 mg via ORAL
  Filled 2018-03-03 (×3): qty 2

## 2018-03-03 MED ORDER — GABAPENTIN 300 MG PO CAPS: 600.0000 mg | ORAL_CAPSULE | Freq: Every day | ORAL | Status: DC

## 2018-03-03 MED ORDER — VANCOMYCIN HCL IN DEXTROSE 1-5 GM/200ML-% IV SOLN
1000.0000 mg | Freq: Three times a day (TID) | INTRAVENOUS | Status: DC
  Administered 2018-03-03 – 2018-03-05 (×5): 1000 mg via INTRAVENOUS
  Filled 2018-03-03 (×5): qty 200

## 2018-03-03 MED ORDER — GABAPENTIN 400 MG PO CAPS
1800.0000 mg | ORAL_CAPSULE | Freq: Every day | ORAL | Status: DC
  Administered 2018-03-03 – 2018-03-04 (×2): 1800 mg via ORAL
  Filled 2018-03-03 (×2): qty 3

## 2018-03-03 MED ORDER — ZOLPIDEM TARTRATE 5 MG PO TABS: 5.0000 mg | ORAL_TABLET | Freq: Every evening | ORAL | Status: DC | PRN

## 2018-03-03 MED ORDER — ALBUTEROL SULFATE HFA 108 (90 BASE) MCG/ACT IN AERS: 2.0000 | INHALATION_SPRAY | Freq: Four times a day (QID) | RESPIRATORY_TRACT | Status: DC | PRN

## 2018-03-03 MED ORDER — SODIUM CHLORIDE 0.9 % IV BOLUS
1500.0000 mL | Freq: Once | INTRAVENOUS | Status: AC
  Administered 2018-03-03: 1500 mL via INTRAVENOUS

## 2018-03-03 MED ORDER — GABAPENTIN 400 MG PO CAPS: 1800.0000 mg | ORAL_CAPSULE | Freq: Every day | ORAL | Status: DC

## 2018-03-03 MED ORDER — GABAPENTIN 300 MG PO CAPS: 600.0000 mg | ORAL_CAPSULE | Freq: Three times a day (TID) | ORAL | Status: DC

## 2018-03-03 MED ORDER — HYDROXYZINE PAMOATE 50 MG PO CAPS: 100.0000 mg | ORAL_CAPSULE | Freq: Four times a day (QID) | ORAL | Status: DC | PRN

## 2018-03-03 MED ORDER — GABAPENTIN 300 MG PO CAPS
600.0000 mg | ORAL_CAPSULE | Freq: Three times a day (TID) | ORAL | Status: DC
  Administered 2018-03-04 – 2018-03-05 (×5): 600 mg via ORAL
  Filled 2018-03-03 (×5): qty 2

## 2018-03-03 MED ORDER — ONDANSETRON HCL 4 MG/2ML IJ SOLN: 4.0000 mg | Freq: Three times a day (TID) | INTRAMUSCULAR | Status: DC | PRN

## 2018-03-03 MED ORDER — ACETAMINOPHEN 325 MG PO TABS: 650.0000 mg | ORAL_TABLET | Freq: Four times a day (QID) | ORAL | Status: DC | PRN

## 2018-03-03 MED ORDER — ALBUTEROL SULFATE (2.5 MG/3ML) 0.083% IN NEBU: 2.5000 mg | INHALATION_SOLUTION | Freq: Four times a day (QID) | RESPIRATORY_TRACT | Status: DC | PRN
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
## 2018-03-03 NOTE — ED Provider Notes (Signed)
Patient placed in Quick Look pathway, seen and evaluated   Chief Complaint: skin infection  HPI:   Patient with pmh of IVDU and recurrent skin infection presents for abscess and leg cellulitis. The patient had onset of left arm antecubital pain, redness and swelling 3 days ago. He noticed purulent drainage above an old skin graft the same day that lead do desquamationo of the tissue which is new. Review of old micro reports shows no bacterial growth  ROS: redness and swelling (one)  Physical Exam:   Gen: No distress  Neuro: Awake and Alert  Skin: Warm    Focused Exam: R antecubital abscess with some blistering and Left lower extremity redness over old skin graft with mild desquamation.        Initiation of care has begun. The patient has been counseled on the process, plan, and necessity for staying for the completion/evaluation, and the remainder of the medical screening examination    Arthor Captain, PA-C 03/03/18 1701    Abelino Derrick, MD 03/04/18 2358
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
## 2018-03-04 ENCOUNTER — Encounter (HOSPITAL_COMMUNITY): Payer: Self-pay | Admitting: General Practice

## 2018-03-04 ENCOUNTER — Inpatient Hospital Stay (HOSPITAL_COMMUNITY): Payer: Medicare PPO

## 2018-03-04 ENCOUNTER — Other Ambulatory Visit: Payer: Self-pay

## 2018-03-04 DIAGNOSIS — L02414 Cutaneous abscess of left upper limb: Principal | ICD-10-CM

## 2018-03-04 DIAGNOSIS — G9341 Metabolic encephalopathy: Secondary | ICD-10-CM | POA: Diagnosis present

## 2018-03-04 LAB — BASIC METABOLIC PANEL
ANION GAP: 11 (ref 5–15)
BUN: 8 mg/dL (ref 6–20)
CALCIUM: 8.3 mg/dL — AB (ref 8.9–10.3)
CHLORIDE: 104 mmol/L (ref 101–111)
CO2: 23 mmol/L (ref 22–32)
Creatinine, Ser: 0.79 mg/dL (ref 0.61–1.24)
GFR calc non Af Amer: >60 mL/min (ref >60)
Glucose, Bld: 77 mg/dL (ref 65–99)
Potassium: 3.6 mmol/L (ref 3.5–5.1)
Sodium: 138 mmol/L (ref 135–145)

## 2018-03-04 LAB — CBC
HCT: 41.5 % (ref 39.0–52.0)
HEMOGLOBIN: 14.2 g/dL (ref 13.0–17.0)
MCH: 30.7 pg (ref 26.0–34.0)
MCHC: 34.2 g/dL (ref 30.0–36.0)
MCV: 89.6 fL (ref 78.0–100.0)
Platelets: 284 10*3/uL (ref 150–400)
RBC: 4.63 MIL/uL (ref 4.22–5.81)
RDW: 12.3 % (ref 11.5–15.5)
WBC: 9.2 10*3/uL (ref 4.0–10.5)

## 2018-03-04 LAB — RAPID URINE DRUG SCREEN, HOSP PERFORMED
Amphetamines: POSITIVE — AB
Barbiturates: NOT DETECTED
Benzodiazepines: NOT DETECTED
COCAINE: NOT DETECTED
OPIATES: NOT DETECTED
Tetrahydrocannabinol: NOT DETECTED

## 2018-03-04 LAB — HIV ANTIBODY (ROUTINE TESTING W REFLEX): HIV Screen 4th Generation wRfx: NONREACTIVE

## 2018-03-04 LAB — MRSA PCR SCREENING: MRSA by PCR: NEGATIVE

## 2018-03-04 MED ORDER — HALOPERIDOL LACTATE 5 MG/ML IJ SOLN
1.0000 mg | Freq: Once | INTRAMUSCULAR | Status: AC
  Administered 2018-03-04: 1 mg via INTRAVENOUS
  Filled 2018-03-04: qty 1

## 2018-03-04 MED ORDER — MUPIROCIN 2 % EX OINT
TOPICAL_OINTMENT | Freq: Every day | CUTANEOUS | Status: DC
  Administered 2018-03-04 – 2018-03-05 (×2): via TOPICAL
  Filled 2018-03-04 (×2): qty 22

## 2018-03-04 MED ORDER — OXYCODONE HCL 5 MG PO TABS
15.0000 mg | ORAL_TABLET | Freq: Four times a day (QID) | ORAL | Status: DC | PRN
  Administered 2018-03-04 – 2018-03-05 (×3): 15 mg via ORAL
  Filled 2018-03-04 (×3): qty 3

## 2018-03-04 MED ORDER — OXYCODONE HCL 5 MG PO TABS
15.0000 mg | ORAL_TABLET | Freq: Four times a day (QID) | ORAL | Status: DC
Start: 2018-03-04 — End: 2018-03-04

## 2018-03-04 NOTE — ED Notes (Signed)
This tech and RN have repeatedly asked pt to put his arm straight cause his IV keeps beeping. Pt asked again to straighten arm Pt yelled and cussed at staff stating " my God D@*& arm is by my side Can't you see the d#$% thing" Pt informed that his fluids are not going cause he keep bending his arm. Pt stated "I'm not bending my D%&* arm it's right here by my side God D#$&*@"

## 2018-03-04 NOTE — Progress Notes (Signed)
Pharmacy was asked by the MD to investigate the patient's home doses of clonazepam and oxycodone. A PMP report was run on this patient, and the patient has consistently gotten clonazpam  #60 for a 30 day supply and Oxycodone  #120 for a 30 day supply monthly since at least January 2019. These fills would be consistent with clonazepam  BID and Oxycodone  QID. Unfortunately, we can not verify if the patient is taking these medications as prescribed.   Thank you for the consult.   Mahogony Gilchrest A. Jeanella Craze, PharmD, BCPS Clinical Pharmacist [place] Pager: [phone]

## 2018-03-04 NOTE — ED Notes (Signed)
Pt asked to put arm straight because IV was beeping. Pt asked how much meds he had left. Informed pt that it was a lot because he wouldn't keep his arm straight so it wasn't going in. Pt sat up in bed and turned around and grabbed the bags of fluids and the IV pole to see how much was left and then yelled Thank you. Security called Optometrist to bedside.

## 2018-03-04 NOTE — Progress Notes (Signed)
PROGRESS NOTE  [name] ZOX:[id number] DOB: [birth year] DOA: 03/03/2018 PCP: Charlott Rakes, MD  HPI/Recap of past 24 hours: Feeling better,no confusion, no agitation He wants his oxycodone increased from  to  , reports this is his home dose) Assessment/Plan: Principal Problem:   Abscess of left arm Active Problems:   GERD (gastroesophageal reflux disease)   Cocaine use disorder, severe, dependence (HCC)   Tobacco use disorder   Polysubstance abuse (HCC)   Left arm cellulitis   Cellulitis of left leg   Acute metabolic encephalopathy   Abscess of left arm and cellulitis in left arm and leg: Patient has leukocytosis, but does not have fever.  Lactic acid is normal.  Clinically not septic.  Hemodynamically stable.  ED physician has drained his left arm abscess He is started on vanc anc cefepime on admission Blood culture no growth, wound culture in process, add on mrsa screening Wbc normalized  Wound care consulted, input appreciated : "Cleanse wounds to left lower leg and left AC space with NS.  Apply mupirocin ointment to wound bed.  Cover with dry dressing and kerlix.  Change daily"  There is reported confusion and agitation last night, no issue today, he is alert pleasant and cooperative.   Polysubstance abuse: Including tobacco, cocaine and alcohol.  Patient states that he did not drink alcohol for long time uds + amphetamines, but negative for opioids or benzo  ( he reports taking oxycodone four time  A day and  clonopin bid ) I have asked pharmacy to investigate this.   Code Status: full  Family Communication: patient   Disposition Plan: hopefully d/c in am once culture result finalized   Consultants:  Wound care  Procedures:  I/D left arm abscess  Antibiotics:  vanc /cefepime   Objective: BP 100/62 (BP Location: Left Arm)   Pulse 77   Temp 97.9 F (36.6 C) (Oral)   Resp 16   Wt 73.5 kg (162 lb 0.6 oz)   SpO2 100%   BMI 23.93 kg/m    Intake/Output Summary (Last 24 hours) at 03/04/2018 1953 Last data filed at 03/04/2018 1900 Gross per 24 hour  Intake 2215 ml  Output 2600 ml  Net -385 ml   Filed Weights   03/03/18 2000 03/04/18 0148  Weight: 77.6 kg (171 lb) 73.5 kg (162 lb 0.6 oz)    Exam: Patient is examined daily including today on 03/04/2018, exams remain the same as of yesterday except that has changed    General:  NAD  Cardiovascular: RRR  Respiratory: CTABL  Abdomen: Soft/ND/NT, positive BS  Musculoskeletal: left antecubital area remain erythematous, but appear improving, no active drainage, edema has much improved. Left leg wrapped.  Neuro: alert, oriented   Data Reviewed: Basic Metabolic Panel: Recent Labs  Lab 03/03/18 1724 03/04/18 0432  NA 133* 138  K 3.9 3.6  CL 96* 104  CO2 26 23  GLUCOSE 106* 77  BUN 8 8  CREATININE 0.99 0.79  CALCIUM 8.8* 8.3*   Liver Function Tests: No results for input(s): AST, ALT, ALKPHOS, BILITOT, PROT, ALBUMIN in the last 168 hours. No results for input(s): LIPASE, AMYLASE in the last 168 hours. No results for input(s): AMMONIA in the last 168 hours. CBC: Recent Labs  Lab 03/03/18 1724 03/04/18 0432  WBC 14.2* 9.2  HGB 15.7 14.2  HCT 45.5 41.5  MCV 88.5 89.6  PLT 364 284   Cardiac Enzymes:   No results for input(s): CKTOTAL, CKMB, CKMBINDEX, TROPONINI in the  last 168 hours. BNP (last 3 results) No results for input(s): BNP in the last 8760 hours.  ProBNP (last 3 results) No results for input(s): PROBNP in the last 8760 hours.  CBG: No results for input(s): GLUCAP in the last 168 hours.  Recent Results (from the past 240 hour(s))  Blood culture (routine x 2)     Status: None (Preliminary result)   Collection Time: 03/03/18  5:25 PM  Result Value Ref Range Status   Specimen Description BLOOD RIGHT ANTECUBITAL  Final   Special Requests   Final    BOTTLES DRAWN AEROBIC AND ANAEROBIC Blood Culture adequate volume   Culture   Final    NO  GROWTH < 24 HOURS Performed at [facility], [address], [place], [place] [address]    Report Status PENDING  Incomplete  Blood culture (routine x 2)     Status: None (Preliminary result)   Collection Time: 03/03/18  5:26 PM  Result Value Ref Range Status   Specimen Description BLOOD ARM RIGHT  Final   Special Requests   Final    BOTTLES DRAWN AEROBIC AND ANAEROBIC Blood Culture adequate volume   Culture   Final    NO GROWTH < 24 HOURS Performed at [facility], [address] [place], [place] [address]    Report Status PENDING  Incomplete  Wound or Superficial Culture     Status: None (Preliminary result)   Collection Time: 03/03/18  8:06 PM  Result Value Ref Range Status   Specimen Description WOUND  Final   Special Requests ABSCESS  Final   Gram Stain   Final    RARE WBC PRESENT, PREDOMINANTLY PMN FEW GRAM POSITIVE COCCI IN PAIRS    Culture   Final    MODERATE GROUP B STREP(S.AGALACTIAE)ISOLATED TESTING AGAINST S. AGALACTIAE NOT ROUTINELY PERFORMED DUE TO PREDICTABILITY OF AMP/PEN/VAN SUSCEPTIBILITY. Performed at [facility] Lab, [address], [place], [place] [address]    Report Status PENDING  Incomplete  MRSA PCR Screening     Status: None   Collection Time: 03/04/18  8:22 AM  Result Value Ref Range Status   MRSA by PCR NEGATIVE NEGATIVE Final    Comment:        The GeneXpert MRSA Assay (FDA approved for NASAL specimens only), is one component of a comprehensive MRSA colonization surveillance program. It is not intended to diagnose MRSA infection nor to guide or monitor treatment for MRSA infections. Performed at [facility], [address] [place], [place] [address]      Studies: Ct Head Wo Contrast  Result Date: 03/04/2018 CLINICAL DATA:  Altered mental status EXAM: CT HEAD WITHOUT CONTRAST TECHNIQUE: Contiguous axial images were obtained from the base of the skull through the vertex without intravenous contrast. COMPARISON:   None. FINDINGS: Brain: No evidence of acute infarction, hemorrhage, hydrocephalus, extra-axial collection or mass lesion/mass effect. Vascular: No hyperdense vessel or unexpected calcification. Skull: Normal. Negative for fracture or focal lesion. Sinuses/Orbits: No acute finding. Other: None. IMPRESSION: Normal head CT Electronically Signed   By: Alcide Clever M.D.   On: 03/04/2018 10:54    Scheduled Meds: . amitriptyline  50 mg Oral QHS  . clonazePAM  1 mg Oral BID  . gabapentin  600 mg Oral TID WC   And  . gabapentin  1,800 mg Oral QHS  . lidocaine (PF)  5 mL Infiltration Once  . mupirocin ointment   Topical Daily  . nicotine  21 mg Transdermal Daily  . pantoprazole  40 mg Oral Daily  . pregabalin  300 mg Oral BID  . QUEtiapine  100 mg Oral QHS    Continuous Infusions: . sodium chloride 100 mL/hr at 03/04/18 0446  . ceFEPime (MAXIPIME) IV Stopped (03/04/18 1406)  . vancomycin Stopped (03/04/18 1436)     Time spent: I have personally reviewed and interpreted on  03/04/2018 daily labs, tele strips, imagings as discussed above under date review session and assessment and plans.  I reviewed all nursing notes, pharmacy notes,  vitals, pertinent old records  I have discussed plan of care as described above with RN , patient on 03/04/2018   Albertine Grates MD, PhD  [facility] Pager [phone]. If 7PM-7AM, please contact night-coverage at [email or web address], password [facility] 03/04/2018, 7:53 PM  LOS: 1 day

## 2018-03-04 NOTE — Progress Notes (Addendum)
Pt is alert, orientedx4, has not been aggressive or attempted to get of bed, very pleasant. Dced sitter at this time. Bed alarm on
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
## 2018-03-05 MED ORDER — CEPHALEXIN 500 MG PO CAPS: 500.0000 mg | ORAL_CAPSULE | Freq: Two times a day (BID) | ORAL | 0 refills | Status: AC

## 2018-03-05 MED ORDER — CEFAZOLIN SODIUM-DEXTROSE 2-4 GM/100ML-% IV SOLN
2.0000 g | Freq: Three times a day (TID) | INTRAVENOUS | Status: DC
  Filled 2018-03-05: qty 100

## 2018-03-05 MED ORDER — CEFAZOLIN SODIUM-DEXTROSE 2-4 GM/100ML-% IV SOLN
2.0000 g | Freq: Three times a day (TID) | INTRAVENOUS | Status: DC
  Administered 2018-03-05: 2 g via INTRAVENOUS
  Filled 2018-03-05 (×2): qty 100

## 2018-03-05 MED ORDER — MUPIROCIN 2 % EX OINT: TOPICAL_OINTMENT | Freq: Every day | CUTANEOUS | 0 refills | Status: AC
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
## 2018-03-05 NOTE — Plan of Care (Signed)
  Problem: Pain Managment: Goal: General experience of comfort will improve Outcome: Progressing   Problem: Safety: Goal: Ability to remain free from injury will improve Outcome: Progressing   

## 2018-03-05 NOTE — Evaluation (Signed)
Physical Therapy Evaluation Patient Details Name: [name] MRN: [id number] DOB: 1976-01-11 Today's Date: 03/05/2018   History of Present Illness  Pt is a 42 y/o male admitted secondary to L UE abscess and L LE cellulitis. PMH including but not limited to polysubstance abuse (cocaine, heroine, marijuana, tobacco, meth, alcohol), IVDU, HCV, gastric ulcer, depression with anxiety.    Clinical Impression  Pt presented supine in bed with HOB elevated, awake and willing to participate in therapy session. Prior to admission, pt reported that he was independent with all functional mobility and ADLs. Pt currently requires min guard for bed mobility, min guard for transfers and min guard for short distance ambulation with RW. Pt would continue to benefit from skilled physical therapy services at this time while admitted and after d/c to address the below listed limitations in order to improve overall safety and independence with functional mobility.     Follow Up Recommendations Home health PT    Equipment Recommendations  Rolling walker with 5" wheels    Recommendations for Other Services       Precautions / Restrictions Precautions Precautions: Fall Restrictions Weight Bearing Restrictions: No      Mobility  Bed Mobility Overal bed mobility: Modified Independent                Transfers Overall transfer level: Needs assistance Equipment used: Rolling walker (2 wheeled) Transfers: Sit to/from Stand Sit to Stand: Min guard         General transfer comment: min guard for safety  Ambulation/Gait Ambulation/Gait assistance: Min guard Ambulation Distance (Feet): 20 Feet(20' x2 with sitting rest break) Assistive device: Rolling walker (2 wheeled) Gait Pattern/deviations: Step-through pattern;Decreased stride length;Decreased dorsiflexion - left;Decreased weight shift to left Gait velocity: decreased Gait velocity interpretation: 1.31 - 2.62 ft/sec, indicative of limited  community ambulator General Gait Details: pt unable to achieve heel strike on L LE secondary to pain; pt with mild instability but no overt LOB or need for physical assistance  Stairs            Wheelchair Mobility    Modified Rankin (Stroke Patients Only)       Balance Overall balance assessment: Needs assistance Sitting-balance support: Feet supported Sitting balance-Leahy Scale: Fair     Standing balance support: During functional activity;Bilateral upper extremity supported Standing balance-Leahy Scale: Poor                               Pertinent Vitals/Pain Pain Assessment: Faces Faces Pain Scale: Hurts little more Pain Location: L LE Pain Descriptors / Indicators: Sore Pain Intervention(s): Monitored during session;Repositioned    Home Living Family/patient expects to be discharged to:: Private residence Living Arrangements: Alone Available Help at Discharge: Other (Comment)("no one")   Home Access: Stairs to enter Entrance Stairs-Rails: None Entrance Stairs-Number of Steps: 1 Home Layout: One level Home Equipment: None      Prior Function Level of Independence: Independent               Hand Dominance        Extremity/Trunk Assessment   Upper Extremity Assessment Upper Extremity Assessment: Overall WFL for tasks assessed    Lower Extremity Assessment Lower Extremity Assessment: Overall WFL for tasks assessed       Communication   Communication: No difficulties  Cognition Arousal/Alertness: Awake/alert Behavior During Therapy: WFL for tasks assessed/performed Overall Cognitive Status: Within Functional Limits for tasks assessed  General Comments      Exercises     Assessment/Plan    PT Assessment Patient needs continued PT services  PT Problem List Decreased strength;Decreased activity tolerance;Decreased balance;Decreased mobility;Decreased  coordination;Decreased knowledge of use of DME;Decreased safety awareness;Decreased knowledge of precautions;Pain       PT Treatment Interventions DME instruction;Stair training;Gait training;Functional mobility training;Therapeutic activities;Balance training;Therapeutic exercise;Neuromuscular re-education;Patient/family education    PT Goals (Current goals can be found in the Care Plan section)  Acute Rehab PT Goals Patient Stated Goal: return home today PT Goal Formulation: With patient Time For Goal Achievement: 03/19/18 Potential to Achieve Goals: Good    Frequency Min 3X/week   Barriers to discharge        Co-evaluation               AM-PAC PT "6 Clicks" Daily Activity  Outcome Measure Difficulty turning over in bed (including adjusting bedclothes, sheets and blankets)?: None Difficulty moving from lying on back to sitting on the side of the bed? : None Difficulty sitting down on and standing up from a chair with arms (e.g., wheelchair, bedside commode, etc,.)?: Unable Help needed moving to and from a bed to chair (including a wheelchair)?: None Help needed walking in hospital room?: A Little Help needed climbing 3-5 steps with a railing? : A Little 6 Click Score: 19    End of Session   Activity Tolerance: Patient limited by pain Patient left: in bed;with call bell/phone within reach;with bed alarm set Nurse Communication: Mobility status;Other (comment)(pt doffed L LE dressing and requesting new dressing) PT Visit Diagnosis: Other abnormalities of gait and mobility (R26.89);Pain Pain - Right/Left: Left Pain - part of body: Leg    Time: 0981-1914 PT Time Calculation (min) (ACUTE ONLY): 17 min   Charges:   PT Evaluation $PT Eval Low Complexity: 1 Low     PT G Codes:        [place], PT, DPT [phone]   Alessandra Bevels Glorianne Proctor 03/05/2018, 5:09 PM

## 2018-03-05 NOTE — Progress Notes (Signed)
Patient discharged to home with instructions. 

## 2018-03-07 LAB — AEROBIC CULTURE  (SUPERFICIAL SPECIMEN)

## 2018-03-07 LAB — AEROBIC CULTURE W GRAM STAIN (SUPERFICIAL SPECIMEN)

## 2018-03-08 LAB — CULTURE, BLOOD (ROUTINE X 2)
CULTURE: NO GROWTH
Culture: NO GROWTH
SPECIAL REQUESTS: ADEQUATE
Special Requests: ADEQUATE

## 2018-03-26 ENCOUNTER — Inpatient Hospital Stay (HOSPITAL_COMMUNITY)
Admission: EM | Admit: 2018-03-26 | Discharge: 2018-03-30 | DRG: 580 | Disposition: A | Discharge status: Home / Self Care | Payer: Medicare PPO | Attending: Internal Medicine | Admitting: Internal Medicine

## 2018-03-26 ENCOUNTER — Encounter (HOSPITAL_COMMUNITY): Payer: Self-pay

## 2018-03-26 ENCOUNTER — Encounter (HOSPITAL_COMMUNITY)
Admission: EM | Disposition: A | Discharge status: Home / Self Care | Payer: Self-pay | Source: Home / Self Care | Attending: Internal Medicine

## 2018-03-26 ENCOUNTER — Emergency Department (HOSPITAL_COMMUNITY): Payer: Medicare PPO

## 2018-03-26 ENCOUNTER — Emergency Department (HOSPITAL_COMMUNITY): Payer: Medicare PPO | Admitting: Certified Registered Nurse Anesthetist

## 2018-03-26 ENCOUNTER — Other Ambulatory Visit: Payer: Self-pay

## 2018-03-26 DIAGNOSIS — L089 Local infection of the skin and subcutaneous tissue, unspecified: Secondary | ICD-10-CM

## 2018-03-26 DIAGNOSIS — Z79899 Other long term (current) drug therapy: Secondary | ICD-10-CM

## 2018-03-26 DIAGNOSIS — L03113 Cellulitis of right upper limb: Secondary | ICD-10-CM | POA: Diagnosis present

## 2018-03-26 DIAGNOSIS — G8929 Other chronic pain: Secondary | ICD-10-CM | POA: Diagnosis present

## 2018-03-26 DIAGNOSIS — Z888 Allergy status to other drugs, medicaments and biological substances status: Secondary | ICD-10-CM | POA: Diagnosis not present

## 2018-03-26 DIAGNOSIS — F419 Anxiety disorder, unspecified: Secondary | ICD-10-CM | POA: Diagnosis present

## 2018-03-26 DIAGNOSIS — T8189XA Other complications of procedures, not elsewhere classified, initial encounter: Secondary | ICD-10-CM | POA: Diagnosis present

## 2018-03-26 DIAGNOSIS — F1910 Other psychoactive substance abuse, uncomplicated: Secondary | ICD-10-CM | POA: Diagnosis present

## 2018-03-26 DIAGNOSIS — F1990 Other psychoactive substance use, unspecified, uncomplicated: Secondary | ICD-10-CM

## 2018-03-26 DIAGNOSIS — Y839 Surgical procedure, unspecified as the cause of abnormal reaction of the patient, or of later complication, without mention of misadventure at the time of the procedure: Secondary | ICD-10-CM | POA: Diagnosis present

## 2018-03-26 DIAGNOSIS — K219 Gastro-esophageal reflux disease without esophagitis: Secondary | ICD-10-CM | POA: Diagnosis present

## 2018-03-26 DIAGNOSIS — L02511 Cutaneous abscess of right hand: Principal | ICD-10-CM | POA: Diagnosis present

## 2018-03-26 DIAGNOSIS — L0390 Cellulitis, unspecified: Secondary | ICD-10-CM | POA: Diagnosis present

## 2018-03-26 DIAGNOSIS — J42 Unspecified chronic bronchitis: Secondary | ICD-10-CM | POA: Diagnosis present

## 2018-03-26 DIAGNOSIS — G629 Polyneuropathy, unspecified: Secondary | ICD-10-CM | POA: Diagnosis present

## 2018-03-26 DIAGNOSIS — Z880 Allergy status to penicillin: Secondary | ICD-10-CM

## 2018-03-26 DIAGNOSIS — Z8619 Personal history of other infectious and parasitic diseases: Secondary | ICD-10-CM | POA: Diagnosis not present

## 2018-03-26 DIAGNOSIS — F17210 Nicotine dependence, cigarettes, uncomplicated: Secondary | ICD-10-CM | POA: Diagnosis present

## 2018-03-26 HISTORY — PX: I & D EXTREMITY: SHX5045

## 2018-03-26 LAB — CBC WITH DIFFERENTIAL/PLATELET
Abs Immature Granulocytes: 0.2 10*3/uL — ABNORMAL HIGH (ref 0.0–0.1)
BASOS PCT: 0 %
Basophils Absolute: 0 10*3/uL (ref 0.0–0.1)
EOS ABS: 0.2 10*3/uL (ref 0.0–0.7)
EOS PCT: 2 %
HEMATOCRIT: 39.8 % (ref 39.0–52.0)
Hemoglobin: 13.3 g/dL (ref 13.0–17.0)
Immature Granulocytes: 1 %
Lymphocytes Relative: 12 %
Lymphs Abs: 1.4 10*3/uL (ref 0.7–4.0)
MCH: 30.1 pg (ref 26.0–34.0)
MCHC: 33.4 g/dL (ref 30.0–36.0)
MCV: 90 fL (ref 78.0–100.0)
Monocytes Absolute: 1.6 10*3/uL — ABNORMAL HIGH (ref 0.1–1.0)
Monocytes Relative: 14 %
Neutro Abs: 8.5 10*3/uL — ABNORMAL HIGH (ref 1.7–7.7)
Neutrophils Relative %: 71 %
PLATELETS: 353 10*3/uL (ref 150–400)
RBC: 4.42 MIL/uL (ref 4.22–5.81)
RDW: 12.9 % (ref 11.5–15.5)
WBC: 12 10*3/uL — AB (ref 4.0–10.5)

## 2018-03-26 LAB — COMPREHENSIVE METABOLIC PANEL
ALK PHOS: 68 U/L (ref 38–126)
ALT: 12 U/L — AB (ref 17–63)
AST: 14 U/L — AB (ref 15–41)
Albumin: 2.9 g/dL — ABNORMAL LOW (ref 3.5–5.0)
Anion gap: 10 (ref 5–15)
BILIRUBIN TOTAL: 0.3 mg/dL (ref 0.3–1.2)
BUN: 8 mg/dL (ref 6–20)
CALCIUM: 8.3 mg/dL — AB (ref 8.9–10.3)
CHLORIDE: 103 mmol/L (ref 101–111)
CO2: 24 mmol/L (ref 22–32)
CREATININE: 0.83 mg/dL (ref 0.61–1.24)
Glucose, Bld: 119 mg/dL — ABNORMAL HIGH (ref 65–99)
Potassium: 3.5 mmol/L (ref 3.5–5.1)
Sodium: 137 mmol/L (ref 135–145)
Total Protein: 6.9 g/dL (ref 6.5–8.1)

## 2018-03-26 LAB — I-STAT CG4 LACTIC ACID, ED
LACTIC ACID, VENOUS: 1.47 mmol/L (ref 0.5–1.9)
Lactic Acid, Venous: 1.34 mmol/L (ref 0.5–1.9)

## 2018-03-26 SURGERY — IRRIGATION AND DEBRIDEMENT EXTREMITY
Anesthesia: General | Site: Hand | Laterality: Right

## 2018-03-26 MED ORDER — ENOXAPARIN SODIUM 40 MG/0.4ML ~~LOC~~ SOLN
40.0000 mg | Freq: Every day | SUBCUTANEOUS | Status: DC
  Administered 2018-03-27 – 2018-03-30 (×4): 40 mg via SUBCUTANEOUS
  Filled 2018-03-26 (×4): qty 0.4

## 2018-03-26 MED ORDER — BUPIVACAINE HCL (PF) 0.25 % IJ SOLN
INTRAMUSCULAR | Status: DC | PRN
  Administered 2018-03-26: 10 mL

## 2018-03-26 MED ORDER — FENTANYL CITRATE (PF) 100 MCG/2ML IJ SOLN
INTRAMUSCULAR | Status: DC | PRN
  Administered 2018-03-26 (×2): 100 ug via INTRAVENOUS

## 2018-03-26 MED ORDER — ONDANSETRON HCL 4 MG PO TABS: 4.0000 mg | ORAL_TABLET | Freq: Four times a day (QID) | ORAL | Status: DC | PRN

## 2018-03-26 MED ORDER — LIDOCAINE HCL (CARDIAC) PF 100 MG/5ML IV SOSY
PREFILLED_SYRINGE | INTRAVENOUS | Status: DC | PRN
  Administered 2018-03-26: 60 mg via INTRAVENOUS

## 2018-03-26 MED ORDER — SODIUM CHLORIDE 0.9 % IV BOLUS
1000.0000 mL | Freq: Once | INTRAVENOUS | Status: AC
  Administered 2018-03-26: 1000 mL via INTRAVENOUS

## 2018-03-26 MED ORDER — LACTATED RINGERS IV SOLN
INTRAVENOUS | Status: DC | PRN
  Administered 2018-03-26: 21:00:00 via INTRAVENOUS

## 2018-03-26 MED ORDER — PROPOFOL 10 MG/ML IV BOLUS
INTRAVENOUS | Status: AC
  Filled 2018-03-26: qty 20

## 2018-03-26 MED ORDER — VITAMIN C 500 MG PO TABS
1000.0000 mg | ORAL_TABLET | Freq: Every day | ORAL | Status: DC
  Administered 2018-03-27 – 2018-03-30 (×4): 1000 mg via ORAL
  Filled 2018-03-26 (×4): qty 2

## 2018-03-26 MED ORDER — VANCOMYCIN HCL IN DEXTROSE 1-5 GM/200ML-% IV SOLN
INTRAVENOUS | Status: AC
  Administered 2018-03-28: 1000 mg via INTRAVENOUS
  Filled 2018-03-26: qty 200

## 2018-03-26 MED ORDER — ACETAMINOPHEN 650 MG RE SUPP: 650.0000 mg | Freq: Four times a day (QID) | RECTAL | Status: DC | PRN

## 2018-03-26 MED ORDER — BUPIVACAINE HCL (PF) 0.25 % IJ SOLN
INTRAMUSCULAR | Status: AC
  Filled 2018-03-26: qty 30

## 2018-03-26 MED ORDER — LACTATED RINGERS IV SOLN
INTRAVENOUS | Status: DC
  Administered 2018-03-26: via INTRAVENOUS

## 2018-03-26 MED ORDER — 0.9 % SODIUM CHLORIDE (POUR BTL) OPTIME
TOPICAL | Status: DC | PRN
  Administered 2018-03-26: 1000 mL

## 2018-03-26 MED ORDER — MIDAZOLAM HCL 2 MG/2ML IJ SOLN
2.0000 mg | Freq: Once | INTRAMUSCULAR | Status: AC
  Administered 2018-03-26: 2 mg via INTRAVENOUS

## 2018-03-26 MED ORDER — VANCOMYCIN HCL 1000 MG IV SOLR
INTRAVENOUS | Status: DC | PRN
  Administered 2018-03-26: 1000 mg via INTRAVENOUS

## 2018-03-26 MED ORDER — TEMAZEPAM 15 MG PO CAPS
15.0000 mg | ORAL_CAPSULE | Freq: Every evening | ORAL | Status: DC | PRN
  Administered 2018-03-27: 15 mg via ORAL
  Filled 2018-03-26: qty 1

## 2018-03-26 MED ORDER — PROPOFOL 10 MG/ML IV BOLUS
INTRAVENOUS | Status: DC | PRN
  Administered 2018-03-26: 200 mg via INTRAVENOUS

## 2018-03-26 MED ORDER — MIDAZOLAM HCL 2 MG/2ML IJ SOLN
INTRAMUSCULAR | Status: AC
  Filled 2018-03-26: qty 2

## 2018-03-26 MED ORDER — FENTANYL CITRATE (PF) 100 MCG/2ML IJ SOLN
INTRAMUSCULAR | Status: AC
  Administered 2018-03-26: 50 ug via INTRAVENOUS
  Filled 2018-03-26: qty 2

## 2018-03-26 MED ORDER — FENTANYL CITRATE (PF) 100 MCG/2ML IJ SOLN
25.0000 ug | INTRAMUSCULAR | Status: DC | PRN
  Administered 2018-03-26 (×3): 50 ug via INTRAVENOUS

## 2018-03-26 MED ORDER — DIPHENHYDRAMINE HCL 25 MG PO CAPS
25.0000 mg | ORAL_CAPSULE | Freq: Four times a day (QID) | ORAL | Status: DC | PRN
  Administered 2018-03-28: 25 mg via ORAL
  Filled 2018-03-26: qty 1

## 2018-03-26 MED ORDER — FENTANYL CITRATE (PF) 100 MCG/2ML IJ SOLN
INTRAMUSCULAR | Status: AC
  Filled 2018-03-26: qty 2

## 2018-03-26 MED ORDER — SUCCINYLCHOLINE CHLORIDE 20 MG/ML IJ SOLN
INTRAMUSCULAR | Status: DC | PRN
  Administered 2018-03-26: 120 mg via INTRAVENOUS

## 2018-03-26 MED ORDER — METHOCARBAMOL 1000 MG/10ML IJ SOLN: 500.0000 mg | Freq: Four times a day (QID) | INTRAVENOUS | Status: DC | PRN

## 2018-03-26 MED ORDER — OXYCODONE-ACETAMINOPHEN 5-325 MG PO TABS
1.0000 | ORAL_TABLET | ORAL | Status: DC | PRN
  Filled 2018-03-26: qty 2

## 2018-03-26 MED ORDER — FENTANYL CITRATE (PF) 250 MCG/5ML IJ SOLN
INTRAMUSCULAR | Status: AC
  Filled 2018-03-26: qty 5

## 2018-03-26 MED ORDER — ONDANSETRON HCL 4 MG/2ML IJ SOLN
INTRAMUSCULAR | Status: DC | PRN
  Administered 2018-03-26: 4 mg via INTRAVENOUS

## 2018-03-26 MED ORDER — DEXAMETHASONE SODIUM PHOSPHATE 4 MG/ML IJ SOLN
INTRAMUSCULAR | Status: DC | PRN
  Administered 2018-03-26: 5 mg via INTRAVENOUS

## 2018-03-26 MED ORDER — METHOCARBAMOL 500 MG PO TABS
500.0000 mg | ORAL_TABLET | Freq: Four times a day (QID) | ORAL | Status: DC | PRN
  Administered 2018-03-27 – 2018-03-29 (×3): 500 mg via ORAL
  Filled 2018-03-26 (×3): qty 1

## 2018-03-26 MED ORDER — ACETAMINOPHEN 325 MG PO TABS
650.0000 mg | ORAL_TABLET | Freq: Four times a day (QID) | ORAL | Status: DC | PRN
  Administered 2018-03-28: 650 mg via ORAL
  Filled 2018-03-26: qty 2

## 2018-03-26 MED ORDER — ONDANSETRON HCL 4 MG/2ML IJ SOLN: 4.0000 mg | Freq: Once | INTRAMUSCULAR | Status: DC | PRN

## 2018-03-26 MED ORDER — OXYCODONE HCL 5 MG PO TABS
15.0000 mg | ORAL_TABLET | Freq: Once | ORAL | Status: AC
  Administered 2018-03-26: 15 mg via ORAL
  Filled 2018-03-26: qty 3

## 2018-03-26 MED ORDER — ONDANSETRON HCL 4 MG/2ML IJ SOLN
4.0000 mg | Freq: Four times a day (QID) | INTRAMUSCULAR | Status: DC | PRN
Start: 2018-03-26 — End: 2018-03-26

## 2018-03-26 MED ORDER — ONDANSETRON HCL 4 MG/2ML IJ SOLN: 4.0000 mg | Freq: Four times a day (QID) | INTRAMUSCULAR | Status: DC | PRN

## 2018-03-26 SURGICAL SUPPLY — 46 items
BANDAGE ACE 3X5.8 VEL STRL LF (GAUZE/BANDAGES/DRESSINGS) ×2 IMPLANT
BANDAGE ACE 4X5 VEL STRL LF (GAUZE/BANDAGES/DRESSINGS) ×2 IMPLANT
BNDG COHESIVE 3X5 WHT NS (GAUZE/BANDAGES/DRESSINGS) ×2 IMPLANT
BNDG ESMARK 4X9 LF (GAUZE/BANDAGES/DRESSINGS) ×2 IMPLANT
BNDG GAUZE ELAST 4 BULKY (GAUZE/BANDAGES/DRESSINGS) ×2 IMPLANT
CORDS BIPOLAR (ELECTRODE) ×2 IMPLANT
COVER SURGICAL LIGHT HANDLE (MISCELLANEOUS) ×2 IMPLANT
CUFF TOURNIQUET SINGLE 18IN (TOURNIQUET CUFF) IMPLANT
CUFF TOURNIQUET SINGLE 24IN (TOURNIQUET CUFF) ×2 IMPLANT
DECANTER SPIKE VIAL GLASS SM (MISCELLANEOUS) ×2 IMPLANT
DRSG PAD ABDOMINAL 8X10 ST (GAUZE/BANDAGES/DRESSINGS) ×4 IMPLANT
GAUZE PACKING IODOFORM 1/2 (PACKING) ×2 IMPLANT
GAUZE SPONGE 4X4 12PLY STRL (GAUZE/BANDAGES/DRESSINGS) ×2 IMPLANT
GAUZE XEROFORM 1X8 LF (GAUZE/BANDAGES/DRESSINGS) ×2 IMPLANT
GLOVE BIO SURGEON STRL SZ7.5 (GLOVE) ×4 IMPLANT
GLOVE BIOGEL PI IND STRL 6.5 (GLOVE) ×1 IMPLANT
GLOVE BIOGEL PI IND STRL 8 (GLOVE) ×2 IMPLANT
GLOVE BIOGEL PI INDICATOR 6.5 (GLOVE) ×1
GLOVE BIOGEL PI INDICATOR 8 (GLOVE) ×2
GLOVE SURG SS PI 6.5 STRL IVOR (GLOVE) ×4 IMPLANT
GOWN STRL REUS W/ TWL LRG LVL3 (GOWN DISPOSABLE) ×1 IMPLANT
GOWN STRL REUS W/ TWL XL LVL3 (GOWN DISPOSABLE) ×2 IMPLANT
GOWN STRL REUS W/TWL LRG LVL3 (GOWN DISPOSABLE) ×1
GOWN STRL REUS W/TWL XL LVL3 (GOWN DISPOSABLE) ×2
KIT BASIN OR (CUSTOM PROCEDURE TRAY) ×2 IMPLANT
KIT TURNOVER KIT B (KITS) ×2 IMPLANT
LOOP VESSEL MAXI BLUE (MISCELLANEOUS) IMPLANT
NEEDLE HYPO 25X1 1.5 SAFETY (NEEDLE) ×2 IMPLANT
NS IRRIG 1000ML POUR BTL (IV SOLUTION) ×2 IMPLANT
PACK ORTHO EXTREMITY (CUSTOM PROCEDURE TRAY) ×2 IMPLANT
PAD ARMBOARD 7.5X6 YLW CONV (MISCELLANEOUS) ×4 IMPLANT
PAD CAST 4YDX4 CTTN HI CHSV (CAST SUPPLIES) ×1 IMPLANT
PADDING CAST COTTON 4X4 STRL (CAST SUPPLIES) ×1
SCRUB BETADINE 4OZ XXX (MISCELLANEOUS) ×2 IMPLANT
SOL PREP POV-IOD 4OZ 10% (MISCELLANEOUS) ×2 IMPLANT
SPONGE LAP 4X18 RFD (DISPOSABLE) ×2 IMPLANT
SUT ETHILON 4 0 P 3 18 (SUTURE) IMPLANT
SUT ETHILON 4 0 PS 2 18 (SUTURE) IMPLANT
SUT MON AB 5-0 P3 18 (SUTURE) IMPLANT
SWAB COLLECTION DEVICE MRSA (MISCELLANEOUS) ×2 IMPLANT
SWAB CULTURE ESWAB REG 1ML (MISCELLANEOUS) ×2 IMPLANT
SYR CONTROL 10ML LL (SYRINGE) ×2 IMPLANT
TUBE CONNECTING 12X1/4 (SUCTIONS) ×2 IMPLANT
TUBE FEEDING ENTERAL 5FR 16IN (TUBING) IMPLANT
UNDERPAD 30X30 (UNDERPADS AND DIAPERS) ×2 IMPLANT
YANKAUER SUCT BULB TIP NO VENT (SUCTIONS) ×2 IMPLANT

## 2018-03-26 NOTE — ED Notes (Signed)
ED Provider at bedside. 
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
## 2018-03-26 NOTE — Anesthesia Procedure Notes (Signed)
Procedure Name: Intubation Date/Time: 03/26/2018 9:07 PM Performed by: Tillman AbideHawkins, Christhoper Busbee B, CRNA Pre-anesthesia Checklist: Patient identified, Emergency Drugs available, Suction available and Patient being monitored Patient Re-evaluated:Patient Re-evaluated prior to induction Oxygen Delivery Method: Circle System Utilized Preoxygenation: Pre-oxygenation with 100% oxygen Induction Type: IV induction Ventilation: Mask ventilation without difficulty Laryngoscope Size: Miller and 2 Grade View: Grade I Tube type: Oral Number of attempts: 1 Airway Equipment and Method: Stylet Placement Confirmation: ETT inserted through vocal cords under direct vision,  positive ETCO2 and breath sounds checked- equal and bilateral Secured at: 21 cm Tube secured with: Tape Dental Injury: Teeth and Oropharynx as per pre-operative assessment

## 2018-03-26 NOTE — OR Nursing (Signed)
Dr. Merlyn LotKuzma completed verbal consent for hand surgery and Theodosia PalingJosh Hawkins, CRNA witnessed the verbal agreement.

## 2018-03-26 NOTE — Transfer of Care (Signed)
Immediate Anesthesia Transfer of Care Note  Patient: [name]  Procedure(s) Performed: IRRIGATION AND DEBRIDEMENT EXTREMITY, RIGHT HAND (Right Hand)  Patient Location: PACU  Anesthesia Type:General  Level of Consciousness: drowsy, patient cooperative and responds to stimulation  Airway & Oxygen Therapy: Patient Spontanous Breathing  Post-op Assessment: Report given to RN and Post -op Vital signs reviewed and stable  Post vital signs: Reviewed and stable  Last Vitals:  Vitals Value Taken Time  BP    Temp    Pulse    Resp    SpO2      Last Pain:  Vitals:   03/26/18 1832  TempSrc:   PainSc: 8          Complications: No apparent anesthesia complications

## 2018-03-26 NOTE — ED Notes (Signed)
Pt. Has swollen right hand rt IVDU, pt also has large healing wound to left calf

## 2018-03-26 NOTE — Procedures (Signed)
Adjunct note to the operative note from 03/26/2018.  Once the patient was in the operating room and under anesthesia it was noted that he had not signed the consent form.  Verbal consent had been obtained preoperatively and witnessed by the CRNA.  The administrator on-call, Concha Sehristy Garrett, was contacted.  This was discussed with her.  It was concluded that the best interest of the patient was to continue with a verbal consent which was documented on the consent form.  It was felt that waking the patient up or not proceeding was not in his best medical interest.
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
## 2018-03-26 NOTE — ED Notes (Signed)
Patient transported to X-ray 

## 2018-03-26 NOTE — Anesthesia Postprocedure Evaluation (Signed)
Anesthesia Post Note  Patient: [name]  Procedure(s) Performed: IRRIGATION AND DEBRIDEMENT EXTREMITY, RIGHT HAND (Right Hand)     Patient location during evaluation: PACU Anesthesia Type: General Level of consciousness: awake and alert Pain management: pain level controlled Vital Signs Assessment: post-procedure vital signs reviewed and stable Respiratory status: spontaneous breathing, nonlabored ventilation, respiratory function stable and patient connected to nasal cannula oxygen Cardiovascular status: blood pressure returned to baseline and stable Postop Assessment: no apparent nausea or vomiting Anesthetic complications: no    Last Vitals:  Vitals:   03/26/18 2245 03/26/18 2300  BP: (!) 128/91 122/76  Pulse: 83 79  Resp: 15 16  Temp:  36.7 C  SpO2: 98% 98%    Last Pain:  Vitals:   03/26/18 2300  TempSrc:   PainSc: Asleep                 Shaeley Segall COKER

## 2018-03-26 NOTE — Anesthesia Preprocedure Evaluation (Addendum)
Anesthesia Evaluation  Patient identified by MRN, date of birth, ID band Patient awake    Reviewed: Allergy & Precautions, NPO status , Patient's Chart, lab work & pertinent test results  Airway Mallampati: II  TM Distance: >3 FB Neck ROM: Full    Dental  (+) Edentulous Upper, Edentulous Lower   Pulmonary Current Smoker,    breath sounds clear to auscultation       Cardiovascular  Rhythm:Regular Rate:Normal     Neuro/Psych    GI/Hepatic   Endo/Other    Renal/GU      Musculoskeletal   Abdominal   Peds  Hematology   Anesthesia Other Findings Swelling, redness dorsum of L. hand  Reproductive/Obstetrics                             Anesthesia Physical Anesthesia Plan  ASA: III and emergent  Anesthesia Plan: General   Post-op Pain Management:    Induction: Intravenous, Cricoid pressure planned and Rapid sequence  PONV Risk Score and Plan: Ondansetron and Dexamethasone  Airway Management Planned: Oral ETT  Additional Equipment:   Intra-op Plan:   Post-operative Plan: Extubation in OR  Informed Consent: I have reviewed the patients History and Physical, chart, labs and discussed the procedure including the risks, benefits and alternatives for the proposed anesthesia with the patient or authorized representative who has indicated his/her understanding and acceptance.     Plan Discussed with: CRNA and Anesthesiologist  Anesthesia Plan Comments:         Anesthesia Quick Evaluation

## 2018-03-26 NOTE — Op Note (Signed)
NAME: Donn Zanetti Guerrini MEDICAL RECORD NO: [id number] DATE OF BIRTH: [birth year] FACILITY: [facility] LOCATION: [facility] OR PHYSICIAN: Tami Ribas, MD   OPERATIVE REPORT   DATE OF PROCEDURE: 03/26/18    PREOPERATIVE DIAGNOSIS:   Right dorsal hand abscess   POSTOPERATIVE DIAGNOSIS:   Right dorsal hand abscess   PROCEDURE:   Incision and drainage of abscess right hand deep to tendons   SURGEON:  Betha Loa, M.D.   ASSISTANT: none   ANESTHESIA:  General   INTRAVENOUS FLUIDS:  Per anesthesia flow sheet.   ESTIMATED BLOOD LOSS:  Minimal.   COMPLICATIONS:  None.   SPECIMENS:   Cultures to micro   TOURNIQUET TIME:    Total Tourniquet Time Documented: Upper Arm (Right) - 26 minutes Total: Upper Arm (Right) - 26 minutes    DISPOSITION:  Stable to PACU.   INDICATIONS: 42 year old right-hand-dominant male states he has been having issues with the right hand for approximately 4 days since a missed injection in the dorsum of the hand.  He has had increasing pain swelling and erythema.  Presented to the emergency department.  Recommended incision and drainage of the right hand for abscess. Risks, benefits and alternatives of surgery were discussed including the risks of blood loss, infection, damage to nerves, vessels, tendons, ligaments, bone for surgery, need for additional surgery, complications with wound healing, continued pain, stiffness.  He voiced understanding of these risks and elected to proceed.  OPERATIVE COURSE:  After being identified preoperatively by myself,  the patient and I agreed on the procedure and site of the procedure.  The surgical site was marked.  Verbal surgical consent had been obtained.  Antibiotics were held for cultures. He was transferred to the operating room and placed on the operating table in supine position with the Right upper extremity on an arm board.  General anesthesia was induced by the anesthesiologist.  Right upper extremity was prepped and draped in  normal sterile orthopedic fashion.  A surgical pause was performed between the surgeons, anesthesia, and operating room staff and all were in agreement as to the patient, procedure, and site of procedure.  Tourniquet at the proximal aspect of the extremity was inflated to 250 mmHg after exsanguination of the arm with an Esmarch bandage.    Incision was made on the dorsum of the hand over the fluctuant area.  There was gross purulence encountered.  This was tracking for more proximally.  The purulence coursed deep to the extensor tendons.  There appeared to be some involvement of the interosseous space between the index and long finger.  The purulence tracked from the fourth dorsal compartment.  The incision was extended proximally.  Cultures were taken and sent to microbiology.  The wound was debrided using the Aundria Rud of any devitalized subcutaneous tissues and purulence.  It was then copiously irrigated with sterile saline.  The wound was then packed with half-inch iodoform gauze.  It was injected with quarter percent plain Marcaine to aid in postoperative analgesia.  He was dressed with sterile 4 x 4's and wrapped lightly with a Kerlix bandage.  A volar splint was placed and wrapped with Kerlix and Ace bandage.  The tourniquet was deflated at 26 minutes.  Fingertips were pink with brisk capillary refill after deflation of tourniquet.  The operative  drapes were broken down.  The patient was awoken from anesthesia safely.  He was transferred back to the stretcher and taken to PACU in stable condition.  He will be admitted  for IV antibiotics.    Tami RibasKUZMA,Dal Blew R, MD Electronically signed, 03/26/18

## 2018-03-26 NOTE — ED Provider Notes (Signed)
Patient placed in Quick Look pathway, seen and evaluated   Chief Complaint: hand swelling  HPI:   Pt injected 40 cc of cocaine and water into right hand Monday- swelling since  ROS: hand pain (one)  Physical Exam:   Gen: No distress  Neuro: Awake and Alert  Skin: Warm    Focused Exam: swelling and redness noted to right dorsal hand    Initiation of care has begun. The patient has been counseled on the process, plan, and necessity for staying for the completion/evaluation, and the remainder of the medical screening examination    Eyvonne MechanicHedges, Jalilah Wiltsie, Cordelia Poche-C 03/26/18 1511    Margarita Grizzleay, Danielle, MD 03/27/18 [phone]

## 2018-03-26 NOTE — ED Triage Notes (Signed)
Pt endorses right hand swelling, redness, inflammation since Monday after attempting to "shoot up 40cc of cocaine and I missed" VSS. Afebrile.

## 2018-03-27 ENCOUNTER — Encounter (HOSPITAL_COMMUNITY): Payer: Self-pay | Admitting: Orthopedic Surgery

## 2018-03-27 DIAGNOSIS — F1990 Other psychoactive substance use, unspecified, uncomplicated: Secondary | ICD-10-CM

## 2018-03-27 DIAGNOSIS — L089 Local infection of the skin and subcutaneous tissue, unspecified: Secondary | ICD-10-CM

## 2018-03-27 LAB — CBC
HCT: 41.7 % (ref 39.0–52.0)
Hemoglobin: 13.7 g/dL (ref 13.0–17.0)
MCH: 30.1 pg (ref 26.0–34.0)
MCHC: 32.9 g/dL (ref 30.0–36.0)
MCV: 91.6 fL (ref 78.0–100.0)
PLATELETS: 328 10*3/uL (ref 150–400)
RBC: 4.55 MIL/uL (ref 4.22–5.81)
RDW: 13 % (ref 11.5–15.5)
WBC: 8.1 10*3/uL (ref 4.0–10.5)

## 2018-03-27 LAB — BASIC METABOLIC PANEL
Anion gap: 12 (ref 5–15)
BUN: 6 mg/dL (ref 6–20)
CHLORIDE: 107 mmol/L (ref 101–111)
CO2: 21 mmol/L — AB (ref 22–32)
Calcium: 8.6 mg/dL — ABNORMAL LOW (ref 8.9–10.3)
Creatinine, Ser: 0.86 mg/dL (ref 0.61–1.24)
GFR calc Af Amer: >60 mL/min (ref >60)
GFR calc non Af Amer: >60 mL/min (ref >60)
Glucose, Bld: 227 mg/dL — ABNORMAL HIGH (ref 65–99)
POTASSIUM: 3.9 mmol/L (ref 3.5–5.1)
SODIUM: 140 mmol/L (ref 135–145)

## 2018-03-27 MED ORDER — PANTOPRAZOLE SODIUM 40 MG PO TBEC
40.0000 mg | DELAYED_RELEASE_TABLET | Freq: Every day | ORAL | Status: DC
  Administered 2018-03-28 – 2018-03-30 (×3): 40 mg via ORAL
  Filled 2018-03-27 (×4): qty 1

## 2018-03-27 MED ORDER — CLONAZEPAM 1 MG PO TABS
1.0000 mg | ORAL_TABLET | Freq: Two times a day (BID) | ORAL | Status: DC
  Administered 2018-03-27 – 2018-03-30 (×7): 1 mg via ORAL
  Filled 2018-03-27 (×7): qty 1

## 2018-03-27 MED ORDER — VANCOMYCIN HCL IN DEXTROSE 1-5 GM/200ML-% IV SOLN
1000.0000 mg | Freq: Three times a day (TID) | INTRAVENOUS | Status: DC
  Administered 2018-03-27 – 2018-03-30 (×10): 1000 mg via INTRAVENOUS
  Filled 2018-03-27 (×12): qty 200

## 2018-03-27 MED ORDER — QUETIAPINE FUMARATE 100 MG PO TABS
100.0000 mg | ORAL_TABLET | Freq: Every day | ORAL | Status: DC
  Administered 2018-03-27 – 2018-03-29 (×3): 100 mg via ORAL
  Filled 2018-03-27 (×3): qty 1

## 2018-03-27 MED ORDER — OXYCODONE HCL 5 MG PO TABS
15.0000 mg | ORAL_TABLET | Freq: Four times a day (QID) | ORAL | Status: DC
Start: 2018-03-27 — End: 2018-03-28
  Administered 2018-03-27 (×4): 15 mg via ORAL
  Filled 2018-03-27 (×4): qty 3

## 2018-03-27 MED ORDER — CLONAZEPAM 1 MG PO TABS
1.0000 mg | ORAL_TABLET | Freq: Once | ORAL | Status: AC
  Administered 2018-03-27: 1 mg via ORAL
  Filled 2018-03-27: qty 1

## 2018-03-27 MED ORDER — PREGABALIN 100 MG PO CAPS
300.0000 mg | ORAL_CAPSULE | Freq: Two times a day (BID) | ORAL | Status: DC
  Administered 2018-03-27 – 2018-03-30 (×7): 300 mg via ORAL
  Filled 2018-03-27 (×7): qty 3

## 2018-03-27 MED ORDER — OXYCODONE HCL 5 MG PO TABS
5.0000 mg | ORAL_TABLET | Freq: Once | ORAL | Status: DC
  Administered 2018-03-27: 10 mg via ORAL
  Filled 2018-03-27: qty 2

## 2018-03-27 MED ORDER — SODIUM CHLORIDE 0.9 % IV SOLN
1.0000 g | Freq: Three times a day (TID) | INTRAVENOUS | Status: DC
  Administered 2018-03-27 – 2018-03-28 (×5): 1 g via INTRAVENOUS
  Filled 2018-03-27 (×6): qty 1

## 2018-03-27 MED ORDER — OXYCODONE HCL 5 MG PO TABS
5.0000 mg | ORAL_TABLET | Freq: Four times a day (QID) | ORAL | Status: DC | PRN
  Administered 2018-03-27 – 2018-03-30 (×12): 5 mg via ORAL
  Filled 2018-03-27 (×13): qty 1

## 2018-03-27 NOTE — Progress Notes (Signed)
PROGRESS NOTE                                                                                                                                                                                                             Patient Demographics:    [name], is a 42 y.o. male, DOB - [birth year], RUE:[id number]  Admit date - 03/26/2018   Admitting Physician Eduard Clos, MD  Outpatient Primary MD for the patient is Charlott Rakes, MD  LOS - 1   Chief Complaint  Patient presents with  . Cellulitis       Brief Narrative    [name] is a 42 y.o. male with history of IV drug abuse presents to the ER because of increasing pain and swelling over the last 5 days involving his right hand.  Patient injected something to his right and 5 days ago following which patient has been having worsening pain swelling and erythema.  ED Course: In the ER x-rays revealed soft tissue swelling of the right hand.  On-call orthopedic surgeon Dr. Merlyn Lot was consulted and patient was taken to the OR and had debridement done.    Subjective:    [name] today has, No headache, No chest pain, No abdominal pain -ports pain in right upper extremity   a Principal Problem:   Infection of right hand Active Problems:   Polysubstance abuse (HCC)   Cellulitis   IV drug user  Cellulitis and abscess of the  right hand  -Secondary to IV drug abuse, status post administration of injection, status post incision and debridement by Dr. Merlyn Lot, for now continue with broad spectrum IV antibiotics including vancomycin and cefepime, follow on intraoperative wound cultures and neuro antibiotics once appropriate, I will send blood cultures as well.   IV drug abuse  -Counseled, follow on HIV labs once available,  Social work consult.  Chronic left leg wound. -Continue with local wound care  Chronic pain and anxiety  -Continue with home medication including Klonopin, continue with  home dose oxycodone, resume home dose Lyrica and Seroquel       Code Status : Full  Family Communication  : Girlfriend at bedside  Disposition Plan  : Home when stable  Consults  : Hand surgery  Procedures  : Incision and drainage of abscess right hand deep to tendons  [place] on 03/26/2018    DVT Prophylaxis  : Lovenox  Lab Results  Component Value Date   PLT 328 03/27/2018    Antibiotics  :    Anti-infectives (From admission, onward)   Start     Dose/Rate Route Frequency Ordered Stop   03/27/18 0600  vancomycin (VANCOCIN) IVPB 1000 mg/200 mL premix     1,000 mg 200 mL/hr over 60 Minutes Intravenous Every 8 hours 03/27/18 0009     03/27/18 0600  ceFEPIme (MAXIPIME) 1 g in sodium chloride 0.9 % 100 mL IVPB     1 g 200 mL/hr over 30 Minutes Intravenous Every 8 hours 03/27/18 0453     03/26/18 2039  vancomycin (VANCOCIN) 1-5 GM/200ML-% IVPB    Note to Pharmacy:  Kelly SplinterHawkins, Joshua   : cabinet override      03/26/18 2039 03/27/18 0844        Objective:   Vitals:   03/26/18 2245 03/26/18 2300 03/27/18 0500 03/27/18 0848  BP: (!) 128/91 122/76 104/64 (!) 149/70  Pulse: 83 79  90  Resp: 15 16    Temp:  98.1 F (36.7 C) 98.2 F (36.8 C)   TempSrc:   Oral   SpO2: 98% 98% 100%   Weight:      Height:        Wt Readings from Last 3 Encounters:  03/26/18 73.5 kg (162 lb)  03/04/18 73.5 kg (162 lb 0.6 oz)  08/27/17 77.1 kg (170 lb)     Intake/Output Summary (Last 24 hours) at 03/27/2018 1341 Last data filed at 03/27/2018 0600 Gross per 24 hour  Intake 1507.37 ml  Output 10 ml  Net 1497.37 ml     Physical Exam  Awake Alert, Oriented X 3, No new F.N deficits, Normal affect Symmetrical Chest wall movement, Good air movement bilaterally, CTAB RRR,No Gallops,Rubs or new Murmurs, No Parasternal Heave +ve B.Sounds, Abd Soft, No tenderness, , No rebound - guarding or rigidity. Right upper extremity bandaged after surgery, chronic left lower extremity scar and  nonhealed wound, does not appear to be infected     Data Review:    CBC Recent Labs  Lab 03/26/18 1521 03/27/18 0343  WBC 12.0* 8.1  HGB 13.3 13.7  HCT 39.8 41.7  PLT 353 328  MCV 90.0 91.6  MCH 30.1 30.1  MCHC 33.4 32.9  RDW 12.9 13.0  LYMPHSABS 1.4  --   MONOABS 1.6*  --   EOSABS 0.2  --   BASOSABS 0.0  --     Chemistries  Recent Labs  Lab 03/26/18 1521 03/27/18 0343  NA 137 140  K 3.5 3.9  CL 103 107  CO2 24 21*  GLUCOSE 119* 227*  BUN 8 6  CREATININE 0.83 0.86  CALCIUM 8.3* 8.6*  AST 14*  --   ALT 12*  --   ALKPHOS 68  --   BILITOT 0.3  --    ------------------------------------------------------------------------------------------------------------------ No results for input(s): CHOL, HDL, LDLCALC, TRIG, CHOLHDL, LDLDIRECT in the last 72 hours.  Lab Results  Component Value Date   HGBA1C 5.9 (H) 07/10/2016   ------------------------------------------------------------------------------------------------------------------ No results for input(s): TSH, T4TOTAL, T3FREE, THYROIDAB in the last 72 hours.  Invalid input(s): FREET3 ------------------------------------------------------------------------------------------------------------------ No results for input(s): VITAMINB12, FOLATE, FERRITIN, TIBC, IRON, RETICCTPCT in the last 72 hours.  Coagulation profile No results for input(s): INR, PROTIME in the last 168 hours.  No results for input(s): DDIMER in the last 72 hours.  Cardiac Enzymes No results for input(s):  CKMB, TROPONINI, MYOGLOBIN in the last 168 hours.  Invalid input(s): CK ------------------------------------------------------------------------------------------------------------------ No results found for: BNP  Inpatient Medications  Scheduled Meds: . clonazePAM  1 mg Oral BID  . enoxaparin (LOVENOX) injection  40 mg Subcutaneous Daily  . oxyCODONE  15 mg Oral QID  . pantoprazole  40 mg Oral Daily  . pregabalin  300 mg Oral  BID  . QUEtiapine  100 mg Oral QHS  . vitamin C  1,000 mg Oral Daily   Continuous Infusions: . ceFEPime (MAXIPIME) IV 1 g (03/27/18 0655)  . lactated ringers Stopped (03/27/18 1004)  . methocarbamol (ROBAXIN)  IV    . vancomycin 1,000 mg (03/27/18 0540)   PRN Meds:.acetaminophen **OR** acetaminophen, diphenhydrAMINE, methocarbamol **OR** methocarbamol (ROBAXIN)  IV, ondansetron **OR** ondansetron (ZOFRAN) IV, oxyCODONE, temazepam  Micro Results Recent Results (from the past 240 hour(s))  Anaerobic culture     Status: None (Preliminary result)   Collection Time: 03/26/18  9:43 PM  Result Value Ref Range Status   Specimen Description ABSCESS RIGHT HAND  Final   Special Requests   Final    NONE Performed at [facility], [address], [place], [place] [address]    Gram Stain PENDING  Incomplete   Culture   Final    NO ANAEROBES ISOLATED; CULTURE IN PROGRESS FOR 5 DAYS   Report Status PENDING  Incomplete  Aerobic Culture (superficial specimen)     Status: None (Preliminary result)   Collection Time: 03/26/18  9:43 PM  Result Value Ref Range Status   Specimen Description ABSCESS RIGHT HAND  Final   Special Requests NONE  Final   Gram Stain   Final    RARE WBC PRESENT, PREDOMINANTLY PMN FEW GRAM POSITIVE COCCI    Culture   Final    CULTURE REINCUBATED FOR BETTER GROWTH Performed at [facility], [address], [place], [place] [address]    Report Status PENDING  Incomplete    Radiology Reports Ct Head Wo Contrast  Result Date: 03/04/2018 CLINICAL DATA:  Altered mental status EXAM: CT HEAD WITHOUT CONTRAST TECHNIQUE: Contiguous axial images were obtained from the base of the skull through the vertex without intravenous contrast. COMPARISON:  None. FINDINGS: Brain: No evidence of acute infarction, hemorrhage, hydrocephalus, extra-axial collection or mass lesion/mass effect. Vascular: No hyperdense vessel or unexpected calcification. Skull: Normal. Negative for  fracture or focal lesion. Sinuses/Orbits: No acute finding. Other: None. IMPRESSION: Normal head CT Electronically Signed   By: Alcide Clever M.D.   On: 03/04/2018 10:54   Dg Hand Complete Right  Result Date: 03/26/2018 CLINICAL DATA:  Right hand swelling and pain following missed attempt to administer intravenous drugs initial encounter EXAM: RIGHT HAND - COMPLETE 3+ VIEW COMPARISON:  08/27/2017 FINDINGS: Considerable soft tissue swelling is noted. No acute fracture or dislocation is seen. No radiopaque foreign body is noted. IMPRESSION: Soft tissue swelling without acute bony abnormality. Electronically Signed   By: Alcide Clever M.D.   On: 03/26/2018 15:53    Huey Bienenstock M.D on 03/27/2018 at 1:41 PM  Between 7am to 7pm - Pager - [phone]  After 7pm go to [email or web address] - password [facility]  [facility] -  Office  [phone]

## 2018-03-27 NOTE — Social Work (Signed)
CSW acknowledging consult for "pt never received home health from visit 10 days ago."  CSW not able to find record of pt being set up with home health services during previous admission. Verbal consult given to RN Case Manager Morrie Sheldonshley, she will f/u.  In the future for support with home health services please consult RN Case Management.  CSW signing off. Please consult if any additional needs arise.  Doy HutchingIsabel H Traycen Goyer, LCSWA [facility] Clinical Social Work [phone]

## 2018-03-27 NOTE — Progress Notes (Signed)
Subjective: 1 Day Post-Op Procedure(s) (LRB): IRRIGATION AND DEBRIDEMENT EXTREMITY, RIGHT HAND (Right) Patient reports pain as improved.  Objective: Vital signs in last 24 hours: Temp:  [97.3 F (36.3 C)-99 F (37.2 C)] 97.3 F (36.3 C) (06/22 1348) Pulse Rate:  [72-97] 72 (06/22 1348) Resp:  [15-21] 20 (06/22 1348) BP: (90-149)/(53-91) 123/88 (06/22 1348) SpO2:  [96 %-100 %] 100 % (06/22 1348) Weight:  [73.5 kg (162 lb)] 73.5 kg (162 lb) (06/21 1453)  Intake/Output from previous day: 06/21 0701 - 06/22 0700 In: 1507.4 [I.V.:1443.3; IV Piggyback:64.1] Out: 10 [Blood:10] Intake/Output this shift: Total I/O In: 480 [P.O.:480] Out: -   Recent Labs    03/26/18 1521 03/27/18 0343  HGB 13.3 13.7   Recent Labs    03/26/18 1521 03/27/18 0343  WBC 12.0* 8.1  RBC 4.42 4.55  HCT 39.8 41.7  PLT 353 328   Recent Labs    03/26/18 1521 03/27/18 0343  NA 137 140  K 3.5 3.9  CL 103 107  CO2 24 21*  BUN 8 6  CREATININE 0.83 0.86  GLUCOSE 119* 227*  CALCIUM 8.3* 8.6*   No results for input(s): LABPT, INR in the last 72 hours.  He is able to wiggle his fingers.  Splint and dressing in good repair.    Assessment/Plan: 1 Day Post-Op Procedure(s) (LRB): IRRIGATION AND DEBRIDEMENT EXTREMITY, RIGHT HAND (Right) Overall appears improved.  His white count has come down nicely.  He is afebrile.  He is concerned about getting enough IV antibiotics given that he has had previous infections.  He would prefer to have one more day.  I will plan to see him in the office at the beginning of this week where we will start hydrotherapy.      Kennett Symes R 03/27/2018, 2:01 PM

## 2018-03-27 NOTE — Progress Notes (Addendum)
Pharmacy Antibiotic Note  Scarlette ShortsLarry H Pendergraft is a 42 y.o. male admitted on 03/26/2018 with wound infection.  Pharmacy has been consulted for Vancomycin/Cefepime dosing. Pt has hx of polysubstance abuse and cellulitis/abscess in the past. This time with infected hand. WBC 12. Renal function good.   Plan: Vancomycin 1000 mg IV q8h Cefepime 1g IV q8h Trend WBC, temp, renal function  F/U infectious work-up Drug levels as indicated   Height: 5\' 9"  (175.3 cm) Weight: 162 lb (73.5 kg) IBW/kg (Calculated) : 70.7  Temp (24hrs), Avg:98.4 F (36.9 C), Min:97.7 F (36.5 C), Max:99 F (37.2 C)  Recent Labs  Lab 03/26/18 1521 03/26/18 1539 03/26/18 1832  WBC 12.0*  --   --   CREATININE 0.83  --   --   LATICACIDVEN  --  1.47 1.34    Estimated Creatinine Clearance: 114.8 mL/min (by C-G formula based on SCr of 0.83 mg/dL).    Allergies  Allergen Reactions  . Penicillin G Shortness Of Breath and Rash    Tolerated Cefepime Has patient had a PCN reaction causing immediate rash, facial/tongue/throat swelling, SOB or lightheadedness with hypotension: YES Has patient had a PCN reaction causing severe rash involving mucus membranes or skin necrosis:  No Has patient had a PCN reaction that required hospitalization No Has patient had a PCN reaction occurring within the last 10 years:YES If all of the above answers are "NO", then may proceed with Cephalosporin use.   [name]. Flexeril [Cyclobenzaprine] Other (See Comments)    Per pt,it makes himill Makes him extremely agitated.   [name]. Penicillins Itching and Rash    Tolerated Cefepime Has patient had a PCN reaction causing immediate rash, facial/tongue/throat swelling, SOB or lightheadedness with hypotension: YES Has patient had a PCN reaction causing severe rash involving mucus membranes or skin necrosis: NO Has patient had a PCN reaction that required hospitalization NO Has patient had a PCN reaction occurring within the last 10 years: NO If all of the above  answers are "NO", then may proceed with Cephalosporin use.   Abran DukeLedford, Lesleyanne Politte 03/27/2018 12:04 AM

## 2018-03-28 LAB — CBC
HEMATOCRIT: 38.1 % — AB (ref 39.0–52.0)
Hemoglobin: 12.9 g/dL — ABNORMAL LOW (ref 13.0–17.0)
MCH: 30.1 pg (ref 26.0–34.0)
MCHC: 33.9 g/dL (ref 30.0–36.0)
MCV: 89 fL (ref 78.0–100.0)
Platelets: 381 10*3/uL (ref 150–400)
RBC: 4.28 MIL/uL (ref 4.22–5.81)
RDW: 12.7 % (ref 11.5–15.5)
WBC: 13.9 10*3/uL — AB (ref 4.0–10.5)

## 2018-03-28 LAB — HIV ANTIBODY (ROUTINE TESTING W REFLEX): HIV SCREEN 4TH GENERATION: NONREACTIVE

## 2018-03-28 MED ORDER — OXYCODONE HCL 5 MG PO TABS
5.0000 mg | ORAL_TABLET | Freq: Once | ORAL | Status: AC
  Administered 2018-03-28: 5 mg via ORAL

## 2018-03-28 MED ORDER — OXYCODONE HCL 5 MG PO TABS
15.0000 mg | ORAL_TABLET | Freq: Four times a day (QID) | ORAL | Status: DC
  Administered 2018-03-28 – 2018-03-30 (×10): 15 mg via ORAL
  Filled 2018-03-28 (×10): qty 3

## 2018-03-28 NOTE — Progress Notes (Signed)
GrenadaBrittany Tyra Gural, charge RN has talked with patient on several occassions about pain medications, and scheduling of pain medication. Patient is requesting that oxycodone 15mg  Q4, and oxycodone 5 mg to be breakthrough between the oxycodone 15 mg pain medication. Elgergawy, MD has been contacted twice. MD stated that pain medications will not be changed, and he will come up and see patient within the next half hour. RN has informed patient that MD will being coming up to see him. Will continue to monitor patient.

## 2018-03-28 NOTE — Progress Notes (Signed)
PROGRESS NOTE                                                                                                                                                                                                             Patient Demographics:    [name], is a 42 y.o. male, DOB - 09-27-1976, ZOX:[id number]  Admit date - 03/26/2018   Admitting Physician Eduard Clos, MD  Outpatient Primary MD for the patient is Charlott Rakes, MD  LOS - 2   Chief Complaint  Patient presents with  . Cellulitis       Brief Narrative    [name] is a 42 y.o. male with history of IV drug abuse presents to the ER because of increasing pain and swelling over the last 5 days involving his right hand.  Patient injected something to his right and 5 days ago following which patient has been having worsening pain swelling and erythema.  ED Course: In the ER x-rays revealed soft tissue swelling of the right hand.  On-call orthopedic surgeon Dr. Merlyn Lot was consulted and patient was taken to the OR and had debridement done.    Subjective:    [name] today has, No headache, No chest pain, No abdominal pain -ports pain in right upper extremity   a Principal Problem:   Infection of right hand Active Problems:   Polysubstance abuse (HCC)   Cellulitis   IV drug user  Cellulitis and abscess of the  right hand  -Secondary to IV drug abuse, status post administration of injection, status post incision and debridement by Dr. Merlyn Lot, overall appears to be improving, on broad-spectrum antibiotic vancomycin and cefepime, intraoperative culture significant for Staphylococcus, will DC cefepime continue with IV vancomycin, patient with worsening leukocytosis, but wound was seen and examined by hand surgery, to be improving, will continue with IV antibiotics for another 24 hours and repeat CBC in a.m.  IV drug abuse  -Counseled, follow on HIV labs once available,  Social work  consult.  Chronic left leg wound. -Continue with local wound care  Chronic pain and anxiety  -Continue with home medication including Klonopin, continue with home dose oxycodone, resume home dose Lyrica and Seroquel       Code Status : Full  Family Communication  : Girlfriend at bedside  Disposition Plan  :  Home when stable  Consults  : Hand surgery  Procedures  : Incision and drainage of abscess right hand deep to tendons took [place] on 03/26/2018    DVT Prophylaxis  : Lovenox  Lab Results  Component Value Date   PLT 381 03/28/2018    Antibiotics  :    Anti-infectives (From admission, onward)   Start     Dose/Rate Route Frequency Ordered Stop   03/27/18 0600  vancomycin (VANCOCIN) IVPB 1000 mg/200 mL premix     1,000 mg 200 mL/hr over 60 Minutes Intravenous Every 8 hours 03/27/18 0009     03/27/18 0600  ceFEPIme (MAXIPIME) 1 g in sodium chloride 0.9 % 100 mL IVPB     1 g 200 mL/hr over 30 Minutes Intravenous Every 8 hours 03/27/18 0453     03/26/18 2039  vancomycin (VANCOCIN) 1-5 GM/200ML-% IVPB    Note to Pharmacy:  Kelly Splinter   : cabinet override      03/26/18 2039 03/27/18 0844        Objective:   Vitals:   03/27/18 0848 03/27/18 1348 03/27/18 1948 03/28/18 1434  BP: (!) 149/70 123/88 126/83 117/73  Pulse: 90 72 72 78  Resp:  20  16  Temp:  (!) 97.3 F (36.3 C) (!) 97.5 F (36.4 C) (!) 97.5 F (36.4 C)  TempSrc:  Oral Oral Oral  SpO2:  100% 100% 98%  Weight:      Height:        Wt Readings from Last 3 Encounters:  03/26/18 73.5 kg (162 lb)  03/04/18 73.5 kg (162 lb 0.6 oz)  08/27/17 77.1 kg (170 lb)     Intake/Output Summary (Last 24 hours) at 03/28/2018 1550 Last data filed at 03/28/2018 1500 Gross per 24 hour  Intake 1885.58 ml  Output 1 ml  Net 1884.58 ml     Physical Exam  Awake Alert, Oriented X 3, No new F.N deficits, Normal affect Symmetrical Chest wall movement, Good air movement bilaterally, CTAB RRR,No Gallops,Rubs or  new Murmurs, No Parasternal Heave +ve B.Sounds, Abd Soft, No tenderness, No organomegaly appriciated, No rebound - guarding or rigidity. Right upper extremity bandaged after surgery, chronic left lower extremity scar and nonhealed wound, does not appear to be infected     Data Review:    CBC Recent Labs  Lab 03/26/18 1521 03/27/18 0343 03/28/18 0430  WBC 12.0* 8.1 13.9*  HGB 13.3 13.7 12.9*  HCT 39.8 41.7 38.1*  PLT 353 328 381  MCV 90.0 91.6 89.0  MCH 30.1 30.1 30.1  MCHC 33.4 32.9 33.9  RDW 12.9 13.0 12.7  LYMPHSABS 1.4  --   --   MONOABS 1.6*  --   --   EOSABS 0.2  --   --   BASOSABS 0.0  --   --     Chemistries  Recent Labs  Lab 03/26/18 1521 03/27/18 0343  NA 137 140  K 3.5 3.9  CL 103 107  CO2 24 21*  GLUCOSE 119* 227*  BUN 8 6  CREATININE 0.83 0.86  CALCIUM 8.3* 8.6*  AST 14*  --   ALT 12*  --   ALKPHOS 68  --   BILITOT 0.3  --    ------------------------------------------------------------------------------------------------------------------ No results for input(s): CHOL, HDL, LDLCALC, TRIG, CHOLHDL, LDLDIRECT in the last 72 hours.  Lab Results  Component Value Date   HGBA1C 5.9 (H) 07/10/2016   ------------------------------------------------------------------------------------------------------------------ No results for input(s): TSH, T4TOTAL, T3FREE, THYROIDAB in the last 72 hours.  Invalid input(s): FREET3 ------------------------------------------------------------------------------------------------------------------ No results for input(s): VITAMINB12, FOLATE, FERRITIN, TIBC, IRON, RETICCTPCT in the last 72 hours.  Coagulation profile No results for input(s): INR, PROTIME in the last 168 hours.  No results for input(s): DDIMER in the last 72 hours.  Cardiac Enzymes No results for input(s): CKMB, TROPONINI, MYOGLOBIN in the last 168 hours.  Invalid input(s):  CK ------------------------------------------------------------------------------------------------------------------ No results found for: BNP  Inpatient Medications  Scheduled Meds: . clonazePAM  1 mg Oral BID  . enoxaparin (LOVENOX) injection  40 mg Subcutaneous Daily  . oxyCODONE  15 mg Oral Q6H  . pantoprazole  40 mg Oral Daily  . pregabalin  300 mg Oral BID  . QUEtiapine  100 mg Oral QHS  . vitamin C  1,000 mg Oral Daily   Continuous Infusions: . ceFEPime (MAXIPIME) IV Stopped (03/28/18 0453)  . lactated ringers Stopped (03/27/18 1004)  . methocarbamol (ROBAXIN)  IV    . vancomycin 1,000 mg (03/28/18 0512)   PRN Meds:.acetaminophen **OR** acetaminophen, diphenhydrAMINE, methocarbamol **OR** methocarbamol (ROBAXIN)  IV, ondansetron **OR** ondansetron (ZOFRAN) IV, oxyCODONE, temazepam  Micro Results Recent Results (from the past 240 hour(s))  Anaerobic culture     Status: None (Preliminary result)   Collection Time: 03/26/18  9:43 PM  Result Value Ref Range Status   Specimen Description ABSCESS RIGHT HAND  Final   Special Requests   Final    NONE Performed at [facility], [address] [place], [place] [address]    Gram Stain PENDING  Incomplete   Culture   Final    NO ANAEROBES ISOLATED; CULTURE IN PROGRESS FOR 5 DAYS   Report Status PENDING  Incomplete  Aerobic Culture (superficial specimen)     Status: None (Preliminary result)   Collection Time: 03/26/18  9:43 PM  Result Value Ref Range Status   Specimen Description ABSCESS RIGHT HAND  Final   Special Requests   Final    NONE Performed at [facility] [place] [facility], [address] [place], [place] [address]    Gram Stain   Final    RARE WBC PRESENT, PREDOMINANTLY PMN FEW GRAM POSITIVE COCCI    Culture MODERATE STAPHYLOCOCCUS AUREUS  Final   Report Status PENDING  Incomplete  Culture, blood (Routine X 2) w Reflex to ID Panel     Status: None (Preliminary result)   Collection Time: 03/27/18  9:06 AM   Result Value Ref Range Status   Specimen Description BLOOD LEFT ANTECUBITAL  Final   Special Requests   Final    BOTTLES DRAWN AEROBIC AND ANAEROBIC Blood Culture adequate volume   Culture   Final    NO GROWTH 1 DAY Performed at [facility], [address] [place], [place] [address]    Report Status PENDING  Incomplete  Culture, blood (Routine X 2) w Reflex to ID Panel     Status: None (Preliminary result)   Collection Time: 03/27/18  9:19 AM  Result Value Ref Range Status   Specimen Description BLOOD LEFT FOREARM  Final   Special Requests   Final    BOTTLES DRAWN AEROBIC AND ANAEROBIC Blood Culture adequate volume   Culture   Final    NO GROWTH 1 DAY Performed at [facility], [address] [place], [place] [address]    Report Status PENDING  Incomplete    Radiology Reports Ct Head Wo Contrast  Result Date: 03/04/2018 CLINICAL DATA:  Altered mental status EXAM: CT HEAD WITHOUT CONTRAST TECHNIQUE:  Contiguous axial images were obtained from the base of the skull through the vertex without intravenous contrast. COMPARISON:  None. FINDINGS: Brain: No evidence of acute infarction, hemorrhage, hydrocephalus, extra-axial collection or mass lesion/mass effect. Vascular: No hyperdense vessel or unexpected calcification. Skull: Normal. Negative for fracture or focal lesion. Sinuses/Orbits: No acute finding. Other: None. IMPRESSION: Normal head CT Electronically Signed   By: Alcide Clever M.D.   On: 03/04/2018 10:54   Dg Hand Complete Right  Result Date: 03/26/2018 CLINICAL DATA:  Right hand swelling and pain following missed attempt to administer intravenous drugs initial encounter EXAM: RIGHT HAND - COMPLETE 3+ VIEW COMPARISON:  08/27/2017 FINDINGS: Considerable soft tissue swelling is noted. No acute fracture or dislocation is seen. No radiopaque foreign body is noted. IMPRESSION: Soft tissue swelling without acute bony abnormality. Electronically Signed   By: Alcide Clever M.D.    On: 03/26/2018 15:53    Huey Bienenstock M.D on 03/28/2018 at 3:50 PM  Between 7am to 7pm - Pager - [phone]  After 7pm go to [email or web address] - password [facility]  [facility] -  Office  [phone]

## 2018-03-28 NOTE — Progress Notes (Signed)
Subjective: 2 Days Post-Op Procedure(s) (LRB): IRRIGATION AND DEBRIDEMENT EXTREMITY, RIGHT HAND (Right) Patient reports pain as improved since this morning but still present.  Improved with increased dose of pain medication..    Objective: Vital signs in last 24 hours: Temp:  [97.5 F (36.4 C)] 97.5 F (36.4 C) (06/22 1948) Pulse Rate:  [72] 72 (06/22 1948) BP: (126)/(83) 126/83 (06/22 1948) SpO2:  [100 %] 100 % (06/22 1948)  Intake/Output from previous day: 06/22 0701 - 06/23 0700 In: 1405.6 [P.O.:480; IV Piggyback:925.6] Out: -  Intake/Output this shift: Total I/O In: 480 [P.O.:480] Out: -   Recent Labs    03/26/18 1521 03/27/18 0343 03/28/18 0430  HGB 13.3 13.7 12.9*   Recent Labs    03/27/18 0343 03/28/18 0430  WBC 8.1 13.9*  RBC 4.55 4.28  HCT 41.7 38.1*  PLT 328 381   Recent Labs    03/26/18 1521 03/27/18 0343  NA 137 140  K 3.5 3.9  CL 103 107  CO2 24 21*  BUN 8 6  CREATININE 0.83 0.86  GLUCOSE 119* 227*  CALCIUM 8.3* 8.6*   No results for input(s): LABPT, INR in the last 72 hours.  Dressing taken down.  Intact sensation and capillary refill all digits.  Able to move fingers much better.  Swelling resolving.  Wound without erythema.  No fluctuance.  No proximal streaking.  Packing in place.    Assessment/Plan: 2 Days Post-Op Procedure(s) (LRB): IRRIGATION AND DEBRIDEMENT EXTREMITY, RIGHT HAND (Right) Improving.  WBC up today.  Afebrile.  Discussed pain control with patient.  Typically will add pain medication to normal daily medication for short term control of post operative pain.  Discussed either discharge home with oral antibiotics or one more day of IV abx due to increase in WBC today.  He states he is more comfortable with IV abx due to previous difficulty with clearing infections.  Will place order for wound care starting tomorrow in case patient stays another day.  Follow up in office this week.    Anahla Bevis R 03/28/2018, 2:29 PM

## 2018-03-28 NOTE — Consult Note (Addendum)
WOC Nurse wound consult note Reason for Consult: Hand surgery following for assessment and plan of care to right arm.  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  This topical treatment is not available in the [facility] formulary.  Substitute Silver incisional dressing while in the hospital to provide antimicrobial benefits and absorb drainage. Discussed plan of care with patient and he verbalized understanding. Please re-consult if further assistance is needed.  Thank-you,  Cammie Mcgeeawn Rheagan Nayak MSN, RN, CWOCN, [place], CNS [phone]

## 2018-03-28 NOTE — Progress Notes (Signed)
Nurse went to the room to give the patient his pain medication.  The patient started yelling in regards to his pain medication regimen.  "I hate God Damn liars, those motherfuckers can get their asses up here and change my pain medication.  Patient threw his Robaxin pill at the nurse and the nursing student and picked up his breakfast and states he will throw the tray.  Patient followed the nurse out of the room and was yelling.  Security has been called.

## 2018-03-29 LAB — CBC
HEMATOCRIT: 39.3 % (ref 39.0–52.0)
Hemoglobin: 12.8 g/dL — ABNORMAL LOW (ref 13.0–17.0)
MCH: 29.8 pg (ref 26.0–34.0)
MCHC: 32.6 g/dL (ref 30.0–36.0)
MCV: 91.6 fL (ref 78.0–100.0)
Platelets: 390 10*3/uL (ref 150–400)
RBC: 4.29 MIL/uL (ref 4.22–5.81)
RDW: 13 % (ref 11.5–15.5)
WBC: 9.4 10*3/uL (ref 4.0–10.5)

## 2018-03-29 LAB — AEROBIC CULTURE  (SUPERFICIAL SPECIMEN)

## 2018-03-29 LAB — AEROBIC CULTURE W GRAM STAIN (SUPERFICIAL SPECIMEN)

## 2018-03-29 MED ORDER — SULFAMETHOXAZOLE-TRIMETHOPRIM 800-160 MG PO TABS: 2.0000 | ORAL_TABLET | Freq: Two times a day (BID) | ORAL | 0 refills | Status: AC

## 2018-03-29 NOTE — Plan of Care (Signed)
  Problem: Education: Goal: Knowledge of General Education information will improve Outcome: Progressing   

## 2018-03-29 NOTE — Discharge Instructions (Signed)
Follow with Primary MD Charlott RakesHodges, [name], MD in 7 days   Get CBC, CMP, checked  by Primary MD next visit.    Activity: As tolerated with Full fall precautions use walker/cane & assistance as needed   Disposition Home    Diet: Regular diet  For Heart failure patients - Check your Weight same time everyday, if you gain over 2 pounds, or you develop in leg swelling, experience more shortness of breath or chest pain, call your Primary MD immediately. Follow Cardiac Low Salt Diet and 1.5 lit/day fluid restriction.   On your next visit with your primary care physician please Get Medicines reviewed and adjusted.   Please request your Prim.MD to go over all Hospital Tests and Procedure/Radiological results at the follow up, please get all Hospital records sent to your Prim MD by signing hospital release before you go home.   If you experience worsening of your admission symptoms, develop shortness of breath, life threatening emergency, suicidal or homicidal thoughts you must seek medical attention immediately by calling 911 or calling your MD immediately  if symptoms less severe.  You Must read complete instructions/literature along with all the possible adverse reactions/side effects for all the Medicines you take and that have been prescribed to you. Take any new Medicines after you have completely understood and accpet all the possible adverse reactions/side effects.   Do not drive, operating heavy machinery, perform activities at heights, swimming or participation in water activities or provide baby sitting services if your were admitted for syncope or siezures until you have seen by Primary MD or a Neurologist and advised to do so again.  Do not drive when taking Pain medications.    Do not take more than prescribed Pain, Sleep and Anxiety Medications  Special Instructions: If you have smoked or chewed Tobacco  in the last 2 yrs please stop smoking, stop any regular Alcohol  and or any  Recreational drug use.  Wear Seat belts while driving.   Please note  You were cared for by a hospitalist during your hospital stay. If you have any questions about your discharge medications or the care you received while you were in the hospital after you are discharged, you can call the unit and asked to speak with the hospitalist on call if the hospitalist that took care of you is not available. Once you are discharged, your primary care physician will handle any further medical issues. Please note that NO REFILLS for any discharge medications will be authorized once you are discharged, as it is imperative that you return to your primary care physician (or establish a relationship with a primary care physician if you do not have one) for your aftercare needs so that they can reassess your need for medications and monitor your lab values.

## 2018-03-29 NOTE — Progress Notes (Signed)
Physical Therapy Wound Evaluation/Treatment Patient Details  Name: [name] MRN: [id number] Date of Birth: 08/21/1976  Today's Date: 03/29/2018 Time: 4944-9675 Time Calculation (min): 44 min  Subjective  Subjective: Pt agreeable to therapy.  Patient and Family Stated Goals: Heal wound Prior Treatments: I&D 03/26/18  Pain Score:  Pt was premedicated prior to hydrotherapy session.   Wound Assessment  Wound / Incision (Open or Dehisced) 03/29/18 Incision - Open Hand Right (Active)  Wound Image   03/29/2018  2:07 PM  Dressing Type Compression wrap;Moist to dry;Gauze (Comment) 03/29/2018  2:07 PM  Dressing Changed Changed 03/29/2018  2:07 PM  Dressing Status Clean;Dry;Intact 03/29/2018  2:07 PM  Dressing Change Frequency Daily 03/29/2018  2:07 PM  Site / Wound Assessment Yellow;Pink;Pale 03/29/2018  2:07 PM  % Wound base Red or Granulating 60% 03/29/2018  2:07 PM  % Wound base Yellow/Fibrinous Exudate 40% 03/29/2018  2:07 PM  % Wound base Black/Eschar 0% 03/29/2018  2:07 PM  Peri-wound Assessment Intact 03/29/2018  2:07 PM  Wound Length (cm) 6.2 cm 03/29/2018  2:07 PM  Wound Width (cm) 1.3 cm 03/29/2018  2:07 PM  Wound Depth (cm) 2.1 cm 03/29/2018  2:07 PM  Wound Volume (cm^3) 16.93 cm^3 03/29/2018  2:07 PM  Wound Surface Area (cm^2) 8.06 cm^2 03/29/2018  2:07 PM  Undermining (cm) 2.1 cm 8:00-10:00; 1.3 cm 1:00-5:00 03/29/2018  2:07 PM  Margins Unattached edges (unapproximated) 03/29/2018  2:07 PM  Closure None 03/29/2018  2:07 PM  Drainage Amount Moderate 03/29/2018  2:07 PM  Drainage Description Purulent 03/29/2018  2:07 PM  Treatment Debridement (Selective);Hydrotherapy (Pulse lavage);Packing (Saline gauze) 03/29/2018  2:07 PM      Hydrotherapy Pulsed lavage therapy - wound location: Dorsum of R hand Pulsed Lavage with Suction (psi): 8 psi Pulsed Lavage with Suction - Normal Saline Used: 1000 mL Pulsed Lavage Tip: Tip with splash shield Selective Debridement Selective Debridement - Location:  Dorsum of R hand Selective Debridement - Tools Used: Forceps;Scissors Selective Debridement - Tissue Removed: Yellow slough and necrotic tissue   Wound Assessment and Plan  Wound Therapy - Assess/Plan/Recommendations Wound Therapy - Clinical Statement: This patient presents to hydrotherapy s/p I&D of R hand abscess. Noted a fair amount of slough and yellow necrotic tissue, especially around the exposed tendon. This patient will benefit from continued hydrotherapy to decrease bioburden and promote wound bed healing.  Wound Therapy - Functional Problem List: Decreased AROM of fingers and wrist.  Factors Delaying/Impairing Wound Healing: Infection - systemic/local;Substance abuse Hydrotherapy Plan: Debridement;Dressing change;Patient/family education;Pulsatile lavage with suction Wound Therapy - Frequency: 6X / week Wound Therapy - Follow Up Recommendations: Home health RN Wound Plan: See above   Wound Therapy Goals- Improve the function of patient's integumentary system by progressing the wound(s) through the phases of wound healing (inflammation - proliferation - remodeling) by: Decrease Necrotic Tissue to: 0% Decrease Necrotic Tissue - Progress: Goal set today Increase Granulation Tissue to: 100% Increase Granulation Tissue - Progress: Goal set today Goals/treatment plan/discharge plan were made with and agreed upon by patient/family: Yes Time For Goal Achievement: 7 days Wound Therapy - Potential for Goals: Good  Goals will be updated until maximal potential achieved or discharge criteria met.  Discharge criteria: when goals achieved, discharge from hospital, MD decision/surgical intervention, no progress towards goals, refusal/missing three consecutive treatments without notification or medical reason.  GP     Thelma Comp 03/29/2018, 2:18 PM  Rolinda Roan, PT, DPT Acute Rehabilitation Services Pager: [phone]

## 2018-03-29 NOTE — Progress Notes (Addendum)
Pt is awaiting to be dc'd. Per day shift RN, pt is awaiting a 5 mg oxycodone prescription. Day RN paged team with no response. This RN paged Dr. Octavio GravesKrakandy, informed to page on-call.  Awaiting response from on-call.
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
## 2018-03-29 NOTE — Care Management Important Message (Signed)
Important Message  Patient Details  Name: [name] ShortsLarry H Curiale MRN: [id number] Date of Birth: 20-Nov-1975   Medicare Important Message Given:  Yes    Mairyn Lenahan Stefan ChurchBratton 03/29/2018, 4:04 PM

## 2018-03-29 NOTE — Progress Notes (Signed)
Orthopedic Tech Progress Note Patient Details:  Scarlette ShortsLarry H Ng 12/30/1975 [id number]  Ortho Devices Type of Ortho Device: Arm sling Ortho Device/Splint Interventions: Application   Post Interventions Patient Tolerated: Well Instructions Provided: Care of device   Saul FordyceJennifer C Shabana Armentrout 03/29/2018, 4:23 PM

## 2018-03-30 MED ORDER — SULFAMETHOXAZOLE-TRIMETHOPRIM 800-160 MG PO TABS
2.0000 | ORAL_TABLET | Freq: Two times a day (BID) | ORAL | Status: DC
  Administered 2018-03-30: 2 via ORAL
  Filled 2018-03-30: qty 2

## 2018-03-30 MED ORDER — OXYCODONE HCL 5 MG PO TABS: 5.0000 mg | ORAL_TABLET | Freq: Four times a day (QID) | ORAL | 0 refills | Status: AC | PRN

## 2018-03-30 NOTE — Progress Notes (Signed)
Physical Therapy Wound Treatment Patient Details  Name: [name] MRN: [id number] Date of Birth: 02/02/1976  Today's Date: 03/30/2018 Time: 1010-1031 Time Calculation (min): 21 min  Subjective  Subjective: Pt agreeable to therapy.  Patient and Family Stated Goals: Heal wound Prior Treatments: I&D 03/26/18  Pain Score: Pt premedicated. Reports the hand feels numb.   Wound Assessment  Wound / Incision (Open or Dehisced) 03/29/18 Incision - Open Hand Right (Active)  Wound Image   03/29/2018  2:07 PM  Dressing Type Compression wrap;Moist to dry;Gauze (Comment) 03/30/2018  2:17 PM  Dressing Changed Changed 03/30/2018  2:17 PM  Dressing Status Clean;Dry;Intact 03/30/2018  2:17 PM  Dressing Change Frequency Daily 03/30/2018  2:17 PM  Site / Wound Assessment Yellow;Pink;Pale 03/30/2018  2:17 PM  % Wound base Red or Granulating 60% 03/30/2018  2:17 PM  % Wound base Yellow/Fibrinous Exudate 40% 03/30/2018  2:17 PM  % Wound base Black/Eschar 0% 03/30/2018  2:17 PM  Peri-wound Assessment Intact 03/30/2018  2:17 PM  Wound Length (cm) 6.2 cm 03/29/2018  2:07 PM  Wound Width (cm) 1.3 cm 03/29/2018  2:07 PM  Wound Depth (cm) 2.1 cm 03/29/2018  2:07 PM  Wound Volume (cm^3) 16.93 cm^3 03/29/2018  2:07 PM  Wound Surface Area (cm^2) 8.06 cm^2 03/29/2018  2:07 PM  Undermining (cm) 2.1 cm 8:00-10:00; 1.3 cm 1:00-5:00 03/29/2018  2:07 PM  Margins Unattached edges (unapproximated) 03/30/2018  2:17 PM  Closure None 03/30/2018  2:17 PM  Drainage Amount Moderate 03/30/2018  2:17 PM  Drainage Description Purulent 03/30/2018  2:17 PM  Treatment Debridement (Selective);Hydrotherapy (Pulse lavage);Packing (Impregnated strip) 03/30/2018  2:17 PM   Hydrotherapy Pulsed lavage therapy - wound location: Dorsum of R hand Pulsed Lavage with Suction (psi): 8 psi Pulsed Lavage with Suction - Normal Saline Used: 1000 mL Pulsed Lavage Tip: Tip with splash shield Selective Debridement Selective Debridement - Location: Dorsum of R  hand Selective Debridement - Tools Used: Forceps;Scissors Selective Debridement - Tissue Removed: Yellow slough and necrotic tissue   Wound Assessment and Plan  Wound Therapy - Assess/Plan/Recommendations Wound Therapy - Clinical Statement: Continue to note a fair amount of slough and yellow necrotic tissue, especially around the exposed tendon. This patient will benefit from continued hydrotherapy to decrease bioburden and promote wound bed healing.  Wound Therapy - Functional Problem List: Decreased AROM of fingers and wrist.  Factors Delaying/Impairing Wound Healing: Infection - systemic/local;Substance abuse Hydrotherapy Plan: Debridement;Dressing change;Patient/family education;Pulsatile lavage with suction Wound Therapy - Frequency: 6X / week Wound Therapy - Follow Up Recommendations: Home health RN Wound Plan: See above   Wound Therapy Goals- Improve the function of patient's integumentary system by progressing the wound(s) through the phases of wound healing (inflammation - proliferation - remodeling) by: Decrease Necrotic Tissue to: 0% Decrease Necrotic Tissue - Progress: Progressing toward goal Increase Granulation Tissue to: 100% Increase Granulation Tissue - Progress: Progressing toward goal Goals/treatment plan/discharge plan were made with and agreed upon by patient/family: Yes Time For Goal Achievement: 7 days Wound Therapy - Potential for Goals: Good  Goals will be updated until maximal potential achieved or discharge criteria met.  Discharge criteria: when goals achieved, discharge from hospital, MD decision/surgical intervention, no progress towards goals, refusal/missing three consecutive treatments without notification or medical reason.  GP     Thelma Comp 03/30/2018, 2:25 PM   Rolinda Roan, PT, DPT Acute Rehabilitation Services Pager: [phone]

## 2018-03-30 NOTE — Progress Notes (Signed)
Discharge instructions completed with pt. Pt verbalized understanding of the information.  Pt denies chest pain, shortness of breath, dizziness, lightheadedness, and n/v.  Pt discharged home.  

## 2018-04-01 LAB — CULTURE, BLOOD (ROUTINE X 2)
Culture: NO GROWTH
Culture: NO GROWTH
SPECIAL REQUESTS: ADEQUATE
Special Requests: ADEQUATE

## 2018-04-01 LAB — ANAEROBIC CULTURE

## 2018-06-02 IMAGING — CT CT HEAD W/O CM
4 series · 16 of 47 positions shown, 18 images · non-contrast
Comparison: None.

CLINICAL DATA: Altered mental status

EXAM:
CT HEAD WITHOUT CONTRAST
TECHNIQUE: Contiguous axial images were obtained from the base of the skull
through the vertex without intravenous contrast.

[Series 2: head wo · axial · 0.44mm/px · z∈[-115,-5]mm · 7 of 30 slices shown, 9 images]
[im 4/30  brain]
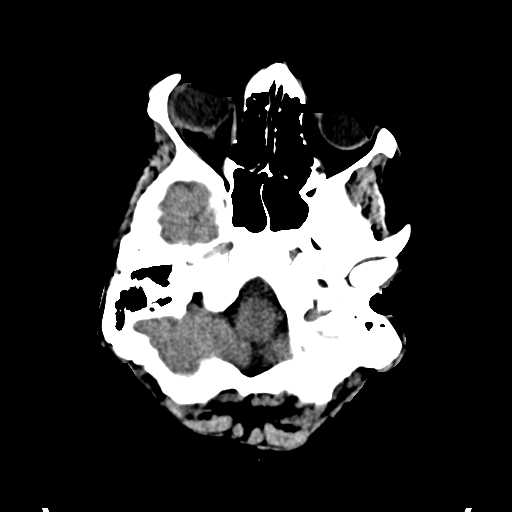
[im 4/30  bone]
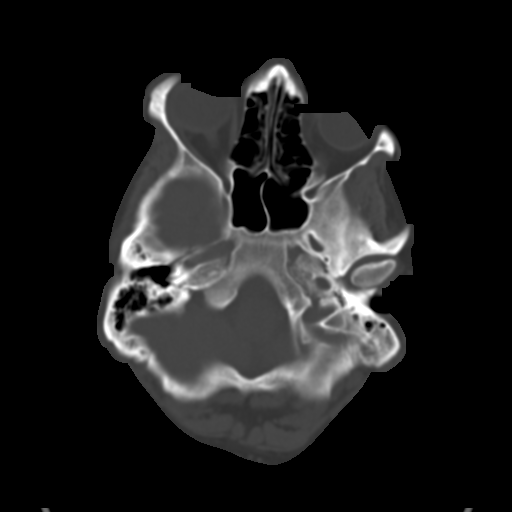
[im 8/30  brain]
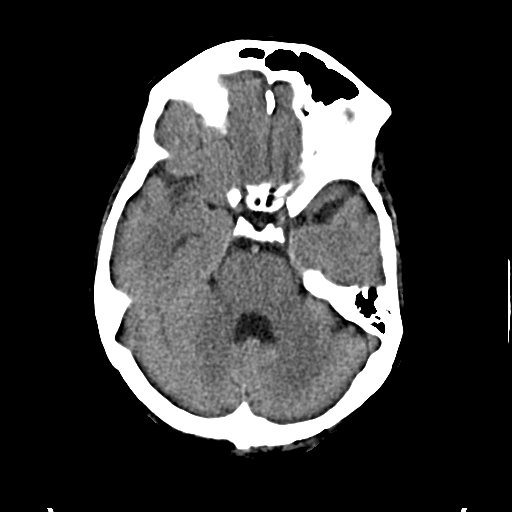
[im 11/30  brain]
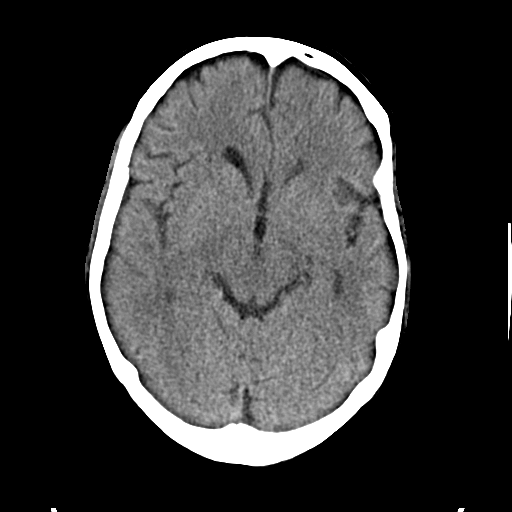
[im 15/30  brain]
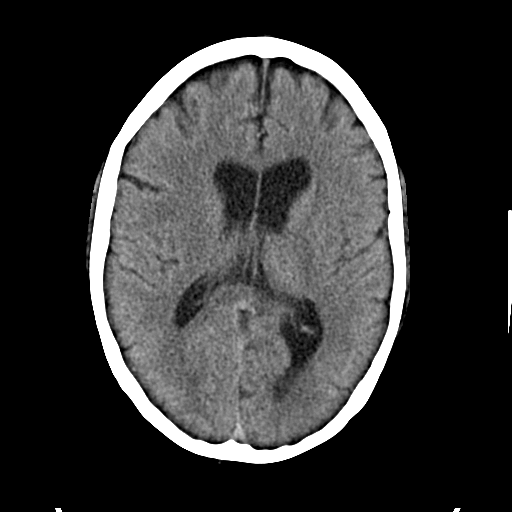
[im 19/30  brain]
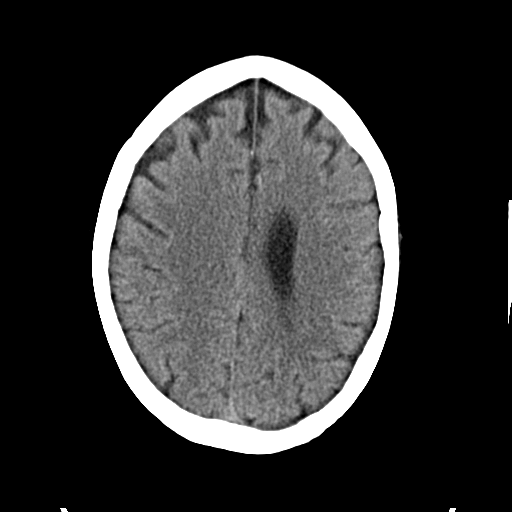
[im 19/30  bone]
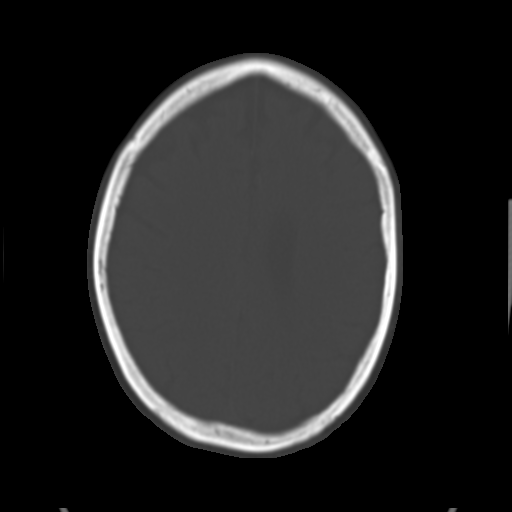
[im 22/30  brain]
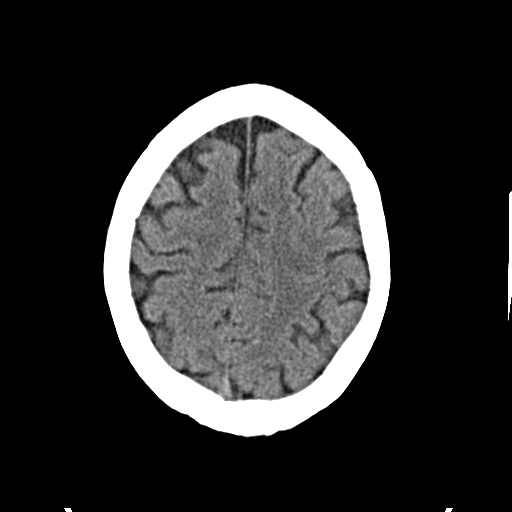
[im 26/30  brain]
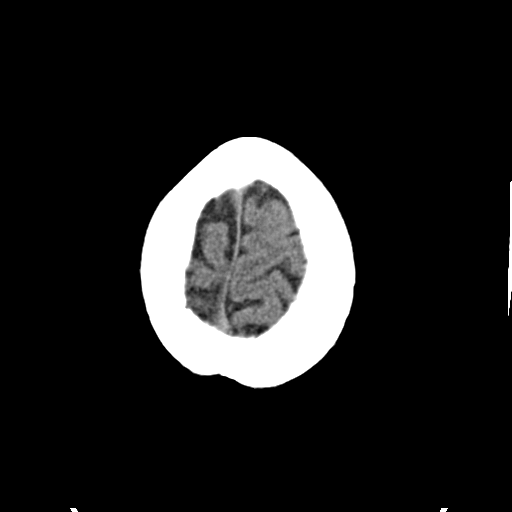

[Series 3: head bone · axial · 0.44mm/px · z∈[-116,-86]mm · 3 of 75 slices shown]
[im 8/75  bone]
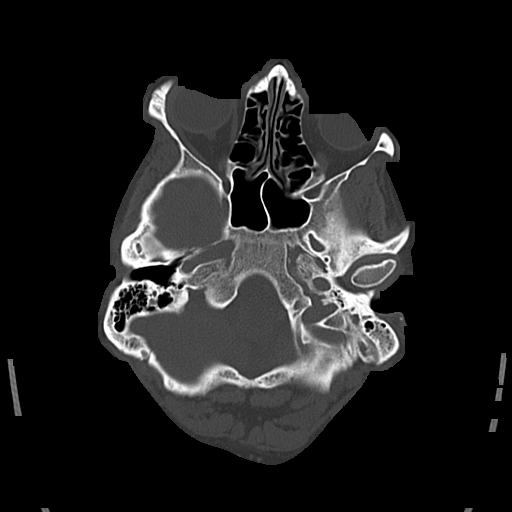
[im 15/75  bone]
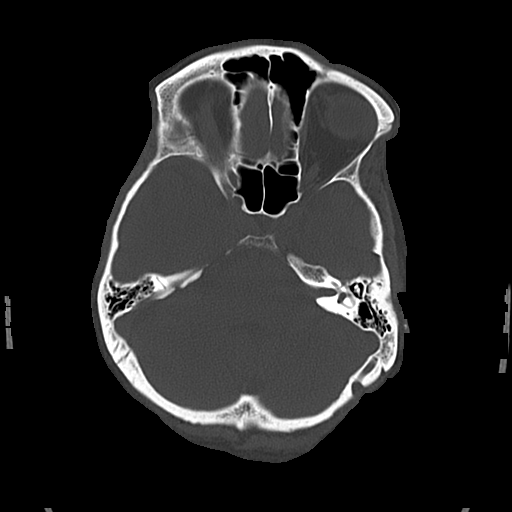
[im 23/75  bone]
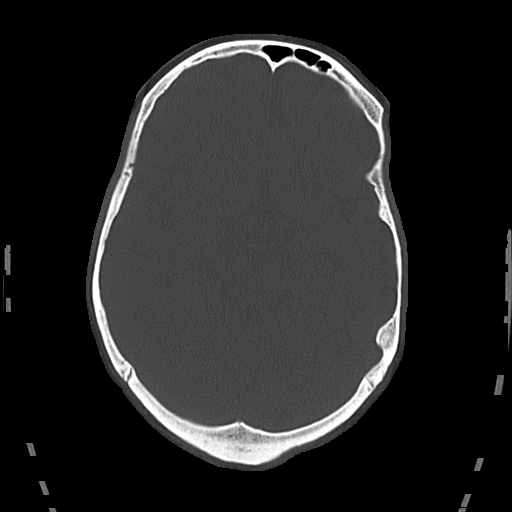

[Series 4: cor soft · coronal · 0.29mm/px · 3 of 77 slices shown]
[im 26/77  brain]
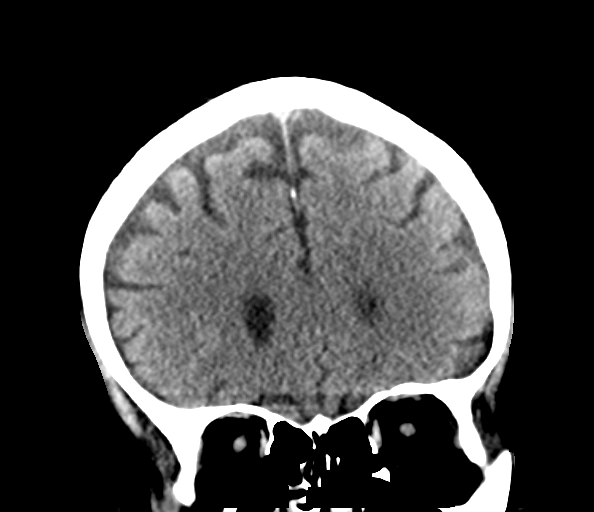
[im 34/77  brain]
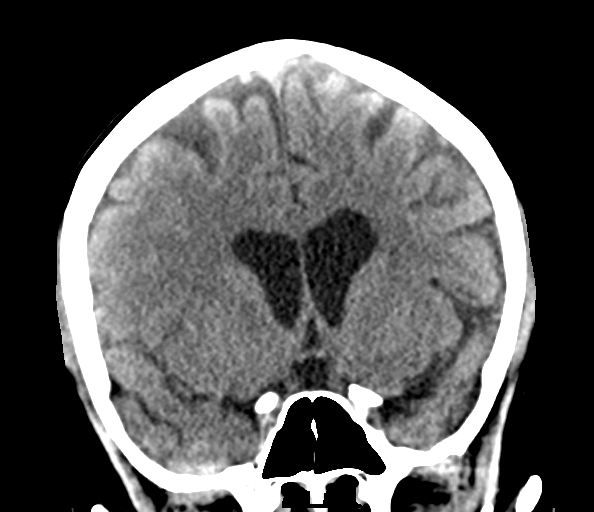
[im 43/77  brain]
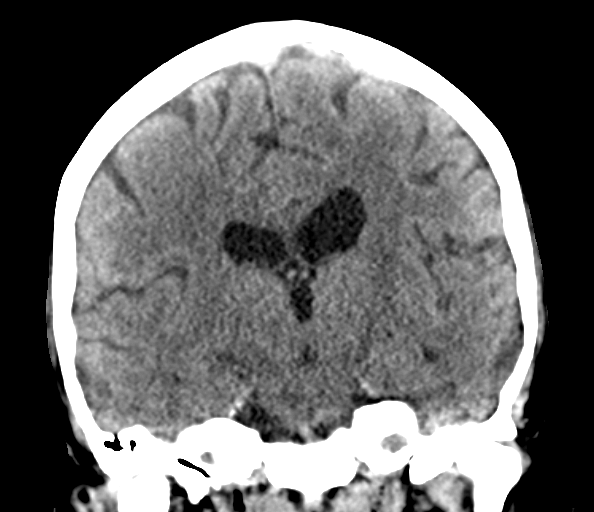

[Series 5: sag soft · sagittal · 0.29mm/px · 3 of 61 slices shown]
[im 21/61  brain]
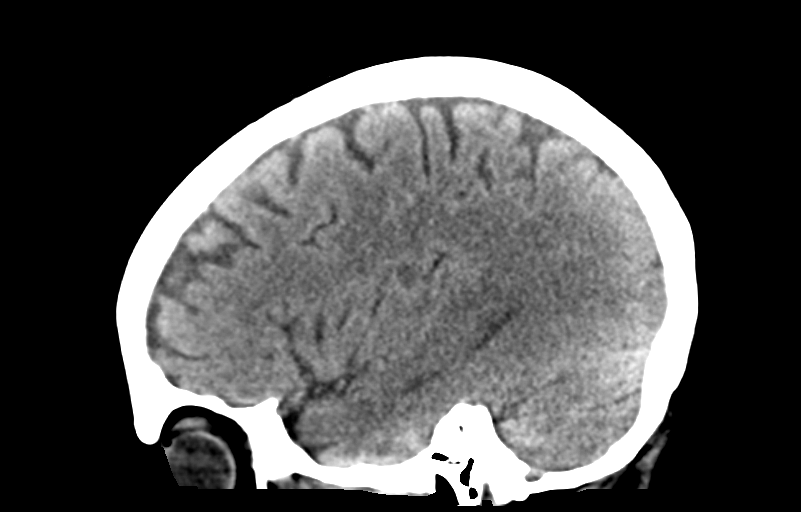
[im 31/61  brain]
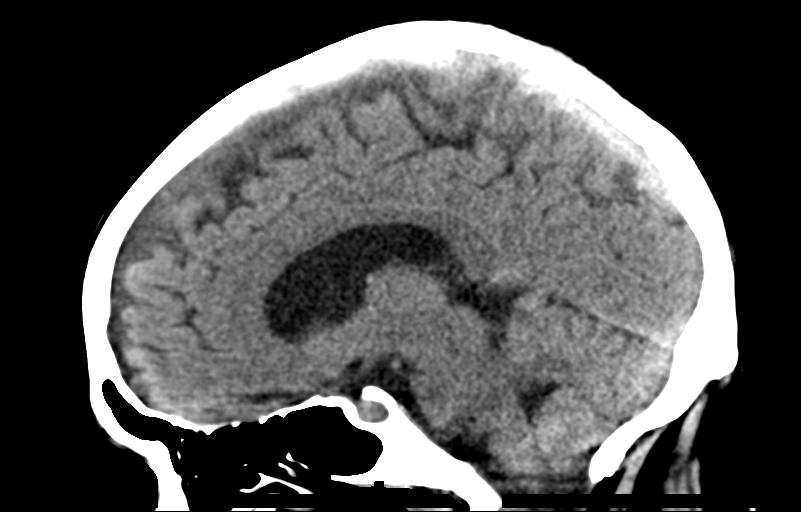
[im 41/61  brain]
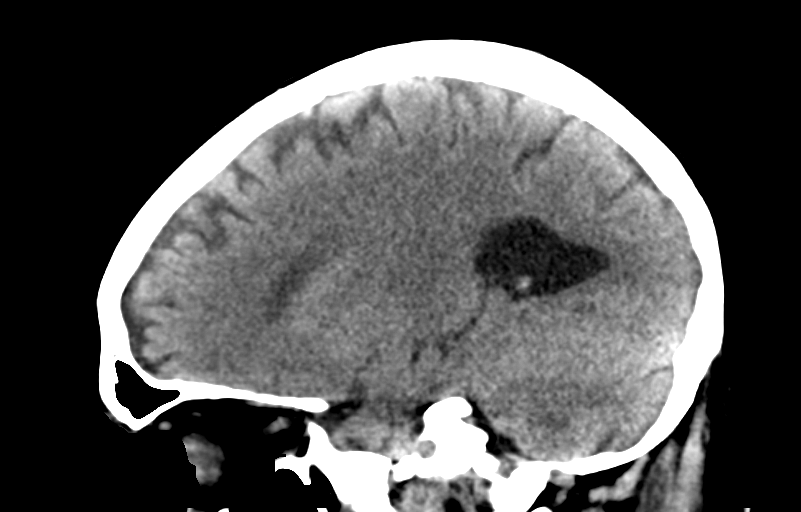

[16 of 47 positions shown; findings below may reference images not displayed]

FINDINGS: Brain: No evidence of acute infarction, hemorrhage, hydrocephalus,
extra-axial collection or mass lesion/mass effect.

Vascular: No hyperdense vessel or unexpected calcification.

Skull: Normal. Negative for fracture or focal lesion.

Sinuses/Orbits: No acute finding.

Other: None.
IMPRESSION: Normal head CT

## 2018-06-10 ENCOUNTER — Emergency Department (HOSPITAL_COMMUNITY)
Admission: EM | Admit: 2018-06-10 | Discharge: 2018-06-10 | Disposition: A | Discharge status: Home / Self Care | Payer: Medicare PPO | Attending: Emergency Medicine | Admitting: Emergency Medicine

## 2018-06-10 ENCOUNTER — Encounter (HOSPITAL_COMMUNITY): Payer: Self-pay | Admitting: Obstetrics and Gynecology

## 2018-06-10 ENCOUNTER — Other Ambulatory Visit: Payer: Self-pay

## 2018-06-10 DIAGNOSIS — R4689 Other symptoms and signs involving appearance and behavior: Secondary | ICD-10-CM | POA: Diagnosis not present

## 2018-06-10 DIAGNOSIS — Z79899 Other long term (current) drug therapy: Secondary | ICD-10-CM | POA: Diagnosis not present

## 2018-06-10 DIAGNOSIS — F99 Mental disorder, not otherwise specified: Secondary | ICD-10-CM

## 2018-06-10 DIAGNOSIS — F1910 Other psychoactive substance abuse, uncomplicated: Secondary | ICD-10-CM | POA: Diagnosis present
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
## 2018-06-10 NOTE — ED Triage Notes (Signed)
Per EMS:  Pt is coming to the ED from motel. With complaint of substance abuse. PD found drug paraphernalia on scene but pt denies using today.  Pt has already cursed and screamed at staff. PT is refusing to allow staff to get vitals.  Pt has had multiple episodes of emesis. PT did not want to come to the hospital per EMS.

## 2018-06-10 NOTE — ED Notes (Signed)
Bed: [facility]4 Expected date:  Expected time:  Means of arrival:  Comments: 

## 2018-06-10 NOTE — ED Notes (Signed)
Patient refused to allow this tech to obtain vitals. Patient non cooperative.

## 2018-10-20 ENCOUNTER — Ambulatory Visit: Payer: Medicaid Other | Admitting: Sports Medicine

## 2018-11-11 ENCOUNTER — Ambulatory Visit: Payer: Medicaid Other | Admitting: Sports Medicine

## 2018-11-27 ENCOUNTER — Encounter (HOSPITAL_COMMUNITY): Payer: Self-pay | Admitting: Emergency Medicine

## 2018-11-27 ENCOUNTER — Emergency Department (HOSPITAL_COMMUNITY)
Admission: EM | Admit: 2018-11-27 | Discharge: 2018-11-27 | Disposition: A | Discharge status: Home / Self Care | Payer: 59 | Attending: Emergency Medicine | Admitting: Emergency Medicine

## 2018-11-27 ENCOUNTER — Other Ambulatory Visit: Payer: Self-pay

## 2018-11-27 DIAGNOSIS — L02413 Cutaneous abscess of right upper limb: Secondary | ICD-10-CM | POA: Diagnosis not present

## 2018-11-27 DIAGNOSIS — F17210 Nicotine dependence, cigarettes, uncomplicated: Secondary | ICD-10-CM | POA: Diagnosis not present

## 2018-11-27 DIAGNOSIS — L0291 Cutaneous abscess, unspecified: Secondary | ICD-10-CM

## 2018-11-27 DIAGNOSIS — Z79899 Other long term (current) drug therapy: Secondary | ICD-10-CM | POA: Diagnosis not present

## 2018-11-27 MED ORDER — CLINDAMYCIN HCL 150 MG PO CAPS
300.0000 mg | ORAL_CAPSULE | Freq: Once | ORAL | Status: AC
  Administered 2018-11-27: 300 mg via ORAL
  Filled 2018-11-27: qty 2

## 2018-11-27 MED ORDER — CLINDAMYCIN HCL 300 MG PO CAPS: 300.0000 mg | ORAL_CAPSULE | Freq: Four times a day (QID) | ORAL | 0 refills | Status: DC

## 2018-11-27 MED ORDER — LIDOCAINE HCL (PF) 1 % IJ SOLN
5.0000 mL | Freq: Once | INTRAMUSCULAR | Status: AC
  Administered 2018-11-27: 5 mL
  Filled 2018-11-27: qty 5

## 2018-11-27 NOTE — ED Triage Notes (Signed)
Pt reports abscess with pain in the right AC. Pt reports abscess appeared about 3 weeks ago. Pt denies cough sx.
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
## 2018-12-01 ENCOUNTER — Encounter: Payer: Self-pay | Admitting: Sports Medicine

## 2018-12-01 ENCOUNTER — Telehealth: Payer: Self-pay | Admitting: *Deleted

## 2018-12-01 ENCOUNTER — Ambulatory Visit (INDEPENDENT_AMBULATORY_CARE_PROVIDER_SITE_OTHER): Payer: 59 | Admitting: Sports Medicine

## 2018-12-01 DIAGNOSIS — R29898 Other symptoms and signs involving the musculoskeletal system: Secondary | ICD-10-CM | POA: Diagnosis not present

## 2018-12-01 DIAGNOSIS — M2041 Other hammer toe(s) (acquired), right foot: Secondary | ICD-10-CM | POA: Diagnosis not present

## 2018-12-01 DIAGNOSIS — M2042 Other hammer toe(s) (acquired), left foot: Secondary | ICD-10-CM

## 2018-12-01 DIAGNOSIS — Q828 Other specified congenital malformations of skin: Secondary | ICD-10-CM

## 2018-12-01 DIAGNOSIS — M7742 Metatarsalgia, left foot: Secondary | ICD-10-CM

## 2018-12-01 DIAGNOSIS — M7741 Metatarsalgia, right foot: Secondary | ICD-10-CM | POA: Diagnosis not present

## 2018-12-01 DIAGNOSIS — M79674 Pain in right toe(s): Secondary | ICD-10-CM | POA: Diagnosis not present

## 2018-12-01 DIAGNOSIS — L909 Atrophic disorder of skin, unspecified: Secondary | ICD-10-CM

## 2018-12-01 DIAGNOSIS — B351 Tinea unguium: Secondary | ICD-10-CM | POA: Diagnosis not present

## 2018-12-01 DIAGNOSIS — M79675 Pain in left toe(s): Secondary | ICD-10-CM | POA: Diagnosis not present

## 2018-12-01 MED ORDER — DICLOFENAC EPOLAMINE 1.3 % TD PTCH: 1.0000 | MEDICATED_PATCH | Freq: Two times a day (BID) | TRANSDERMAL | 0 refills | Status: DC

## 2018-12-01 NOTE — Telephone Encounter (Signed)
Sadie - [facility] states pt's wife wanted the patches sent to [facility] [address]. I reordered the Diclofenac Patch.

## 2018-12-01 NOTE — Progress Notes (Signed)
Subjective: [name] is a 43 y.o. male patient who presents to office for evaluation of Right> Left foot pain secondary to callus skin. Patient complains of pain at the lesion present Right>Left foot at the sub met 4. Patient has tried OTC pads and trimming with no relief in symptoms. Patient denies any other pedal complaints.   Patient Active Problem List   Diagnosis Date Noted  . Infection of right hand 03/26/2018  . Cellulitis 03/26/2018  . IV drug user   . Acute metabolic encephalopathy 16/38/4536  . Polysubstance abuse ([place]) 03/03/2018  . Abscess of left arm 03/03/2018  . Left arm cellulitis 03/03/2018  . Cellulitis of left leg 03/03/2018  . Pain in right wrist 09/01/2017  . Major depressive disorder, recurrent severe without psychotic features ([place]) 12/08/2016  . Acute hepatitis 10/04/2016  . Itching 10/04/2016  . Hepatitis C 07/12/2016  . Cannabis use disorder, severe, dependence ([place]) 07/10/2016  . Cocaine use disorder, severe, dependence ([place]) 07/10/2016  . Opioid use disorder, severe, dependence ([place]) 07/10/2016  . Alcohol use disorder, severe, dependence ([place]) 07/10/2016  . Tobacco use disorder 07/10/2016  . MDD (major depressive disorder), recurrent episode, severe ([place]) 07/09/2016  . Chronic back pain 03/01/2013  . Neuropathy of left lower extremity 03/01/2013  . GERD (gastroesophageal reflux disease) 03/01/2013    Current Outpatient Medications on File Prior to Visit  Medication Sig Dispense Refill  . albuterol (PROVENTIL HFA;VENTOLIN HFA) 108 (90 Base) MCG/ACT inhaler Inhale 2 puffs into the lungs every 6 (six) hours as needed for wheezing or shortness of breath.    [name] amitriptyline (ELAVIL) 50 MG tablet Take 50 mg by mouth at bedtime.    . clindamycin (CLEOCIN) 300 MG capsule Take 1 capsule (300 mg total) by mouth 4 (four) times daily. X 7 days 28 capsule 0  . clonazePAM (KLONOPIN) 1 MG tablet Take 1 mg by mouth 2 (two) times daily.    [name] esomeprazole (NEXIUM) 40  MG capsule Take 1 capsule (40 mg total) by mouth daily. For acid reflux    . hydrOXYzine (VISTARIL) 100 MG capsule Take 100 mg by mouth 4 (four) times daily as needed for itching.   5  . ibuprofen (ADVIL,MOTRIN) 600 MG tablet Take 1 tablet (600 mg total) by mouth 3 (three) times daily as needed for fever, headache or cramping. 30 tablet 0  . mupirocin ointment (BACTROBAN) 2 % Apply topically daily. Cleanse wounds to left lower leg and left AC space with NS.  Apply mupirocin ointment to wound bed.  Cover with dry dressing and kerlix.  Change daily. 22 g 0  . oxyCODONE (ROXICODONE) 15 MG immediate release tablet Take 15 mg by mouth 4 (four) times daily.    [name] oxyCODONE (ROXICODONE) 5 MG immediate release tablet Take 1 tablet (5 mg total) by mouth every 6 (six) hours as needed for breakthrough pain. 20 tablet 0  . pregabalin (LYRICA) 300 MG capsule Take 300 mg by mouth 2 (two) times daily.    . QUEtiapine (SEROQUEL) 100 MG tablet Take 100 mg by mouth at bedtime.    [name] testosterone cypionate (DEPOTESTOSTERONE CYPIONATE) 200 MG/ML injection Inject 1 mL into the muscle every 14 (fourteen) days.     No current facility-administered medications on file prior to visit.     Allergies  Allergen Reactions  . Penicillin G Shortness Of Breath and Rash    Tolerated Cefepime Has patient had a PCN reaction causing immediate rash, facial/tongue/throat swelling, SOB or lightheadedness with hypotension:  YES Has patient had a PCN reaction causing severe rash involving mucus membranes or skin necrosis:  No Has patient had a PCN reaction that required hospitalization No Has patient had a PCN reaction occurring within the last 10 years:YES If all of the above answers are "NO", then may proceed with Cephalosporin use.   [name] [Cyclobenzaprine] Other (See Comments)    Per pt,it makes himill Makes him extremely agitated.   [name] Penicillins Itching and Rash    Tolerated Cefepime Has patient had a PCN reaction  causing immediate rash, facial/tongue/throat swelling, SOB or lightheadedness with hypotension: YES Has patient had a PCN reaction causing severe rash involving mucus membranes or skin necrosis: NO Has patient had a PCN reaction that required hospitalization NO Has patient had a PCN reaction occurring within the last 10 years: NO If all of the above answers are "NO", then may proceed with Cephalosporin use.    Objective:  General: Alert and oriented x3 in no acute distress  Dermatology: Keratotic lesion present sub met 4 bilateral with skin lines transversing the lesion, pain is present with direct pressure to the lesion with a central nucleated core noted, no webspace macerations, no ecchymosis bilateral, all nails x 10 are well manicured.Old surgical scar 5th MTPJ on left.   Vascular: Dorsalis Pedis and Posterior Tibial pedal pulses 2/4, Capillary Fill Time 3 seconds, + pedal hair growth bilateral, no edema bilateral lower extremities, Temperature gradient within normal limits.  Neurology: Johney Maine sensation intact via light touch bilateral.  Musculoskeletal: Mild tenderness on left sub met 4 and Moderate tenderness on right sub met 4 with palpation at the keratotic lesion site on Right>Left, Muscular strength 5/5 in all groups without pain or limitation on range of motion. + hammertoe and fat pad atrophy deformity.  Assessment and Plan: Problem List Items Addressed This Visit    None    Visit Diagnoses    Porokeratosis    -  Primary   Metatarsalgia of both feet       Hammer toes of both feet       Fat pad atrophy of foot          -Complete examination performed -Discussed treatment options for painful callus secondary to foot type -Patient declined trim due to tenderness to calluses  -Rx Flector patch -Rx [facility] orthotics  -Encouraged use of pumice stone  -Advised good supportive shoes and refrain from walking barefoot -Patient to return to office as needed or sooner if condition  worsens.  Landis Martins, DPM

## 2018-12-27 ENCOUNTER — Encounter: Payer: 59 | Admitting: Family

## 2019-01-11 ENCOUNTER — Other Ambulatory Visit: Payer: Self-pay

## 2019-01-11 DIAGNOSIS — I739 Peripheral vascular disease, unspecified: Secondary | ICD-10-CM

## 2019-01-24 ENCOUNTER — Encounter (HOSPITAL_COMMUNITY): Payer: Self-pay

## 2019-01-24 ENCOUNTER — Encounter: Payer: Self-pay | Admitting: Surgery

## 2019-03-06 ENCOUNTER — Other Ambulatory Visit: Payer: Self-pay

## 2019-03-06 ENCOUNTER — Encounter (HOSPITAL_COMMUNITY): Payer: Self-pay | Admitting: Family Medicine

## 2019-03-06 ENCOUNTER — Ambulatory Visit (HOSPITAL_COMMUNITY)
Admission: EM | Admit: 2019-03-06 | Discharge: 2019-03-06 | Disposition: A | Discharge status: Home / Self Care | Payer: 59 | Attending: Family Medicine | Admitting: Family Medicine

## 2019-03-06 DIAGNOSIS — F1129 Opioid dependence with unspecified opioid-induced disorder: Secondary | ICD-10-CM

## 2019-03-06 DIAGNOSIS — M21332 Wrist drop, left wrist: Secondary | ICD-10-CM | POA: Diagnosis not present

## 2019-03-06 DIAGNOSIS — G5632 Lesion of radial nerve, left upper limb: Secondary | ICD-10-CM | POA: Diagnosis not present

## 2019-03-06 NOTE — ED Triage Notes (Signed)
Pt presents with numbness and tingling in left wrist & hand for past 2 days not injury related.  Pt states he shoots up illegal drugs occasional but does not remember shooting up in that area.

## 2019-03-06 NOTE — Discharge Instructions (Addendum)
Call your neurologist tomorrow for evaluation  You are the only one who can stop the opiate addiction.  There are rehab facilities you can call.Marland Kitchenask your personal doctor when you are ready.
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
## 2019-03-15 ENCOUNTER — Telehealth: Payer: Self-pay | Admitting: Pharmacy Technician

## 2019-03-15 ENCOUNTER — Encounter: Payer: 59 | Admitting: Family

## 2019-03-15 NOTE — Telephone Encounter (Signed)
[facility] Patient Teacher, English as a foreign language completed.    The patient is insured through [employer] and has an undetermined copay, which will be known once prior authorization is completed.  We will continue to follow to see if copay assistance is needed.  [name]. [name] [place] Patient [facility]ious [facility] Phone: [phone] Fax:  [phone]

## 2019-04-04 ENCOUNTER — Inpatient Hospital Stay (HOSPITAL_COMMUNITY): Admission: RE | Admit: 2019-04-04 | Payer: 59 | Source: Ambulatory Visit

## 2019-04-04 ENCOUNTER — Encounter: Payer: Self-pay | Admitting: Family

## 2019-04-04 ENCOUNTER — Encounter (HOSPITAL_COMMUNITY): Payer: 59

## 2019-04-04 ENCOUNTER — Encounter: Payer: 59 | Admitting: Surgery

## 2019-04-28 ENCOUNTER — Other Ambulatory Visit: Payer: Self-pay

## 2019-04-28 ENCOUNTER — Encounter: Payer: Self-pay | Admitting: Neurology

## 2019-04-28 ENCOUNTER — Encounter: Payer: Self-pay | Admitting: *Deleted

## 2019-04-28 ENCOUNTER — Encounter

## 2019-04-28 ENCOUNTER — Ambulatory Visit (INDEPENDENT_AMBULATORY_CARE_PROVIDER_SITE_OTHER): Payer: 59 | Admitting: Neurology

## 2019-04-28 ENCOUNTER — Ambulatory Visit
Admission: RE | Admit: 2019-04-28 | Discharge: 2019-04-28 | Disposition: A | Discharge status: Home / Self Care | Payer: 59 | Source: Ambulatory Visit | Attending: Neurology | Admitting: Neurology

## 2019-04-28 VITALS — BP 110/70 | HR 80 | Temp 98.6°F | Ht 68.0 in | Wt 166.0 lb

## 2019-04-28 DIAGNOSIS — S6992XA Unspecified injury of left wrist, hand and finger(s), initial encounter: Secondary | ICD-10-CM

## 2019-04-28 DIAGNOSIS — M21332 Wrist drop, left wrist: Secondary | ICD-10-CM

## 2019-04-28 DIAGNOSIS — Z9189 Other specified personal risk factors, not elsewhere classified: Secondary | ICD-10-CM

## 2019-04-28 NOTE — Patient Instructions (Addendum)
Xrays of left wrist Orthopaedics Physical Therapy
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
## 2019-05-01 DIAGNOSIS — Z9189 Other specified personal risk factors, not elsewhere classified: Secondary | ICD-10-CM | POA: Insufficient documentation

## 2019-05-02 ENCOUNTER — Telehealth: Payer: Self-pay

## 2019-05-02 NOTE — Telephone Encounter (Signed)
-----   Message from Melvenia Beam, MD sent at 04/28/2019  5:09 PM EDT ----- FINDINGS: There is no evidence of fracture or dislocation. There is no evidence of arthropathy or other focal bone abnormality. Soft tissues are unremarkable.

## 2019-05-02 NOTE — Telephone Encounter (Signed)
Pt notified of results and verbalized understanding  

## 2019-05-16 ENCOUNTER — Telehealth: Payer: Self-pay | Admitting: *Deleted

## 2019-05-16 NOTE — Telephone Encounter (Signed)
Received request from [facility] for a custom orthotic device. Order signed by Dr. Jaynee Eagles and faxed back to [facility]. Received a receipt of confirmation.

## 2019-05-16 NOTE — Telephone Encounter (Signed)
Received OT plan of care forms. Forms signed by MD and faxed back. Received a receipt of confirmation.

## 2019-05-19 ENCOUNTER — Other Ambulatory Visit: Payer: Self-pay

## 2019-05-19 DIAGNOSIS — I739 Peripheral vascular disease, unspecified: Secondary | ICD-10-CM

## 2019-05-30 ENCOUNTER — Encounter (HOSPITAL_COMMUNITY): Payer: 59

## 2019-05-30 ENCOUNTER — Encounter: Payer: 59 | Admitting: Surgery

## 2019-06-03 ENCOUNTER — Telehealth (HOSPITAL_COMMUNITY): Payer: Self-pay | Admitting: *Deleted

## 2019-06-03 NOTE — Telephone Encounter (Signed)
The above patient or their representative was contacted and gave the following answers to these questions:         Do you have any of the following symptoms?n  Fever                    Cough                   Shortness of breath  Do  you have any of the following other symptoms? n  muscle pain         vomiting,        diarrhea        rash         weakness        red eye        abdominal pain         bruising          bruising or bleeding              joint pain           severe headache    Have you been in contact with someone who was or has been sick in the past 2 weeks?n  Yes                 Unsure                         Unable to assess   Does the person that you were in contact with have any of the following symptoms?   Cough         shortness of breath           muscle pain         vomiting,            diarrhea            rash            weakness           fever            red eye           abdominal pain           bruising  or  bleeding                joint pain                severe headache               Have you  or someone you have been in contact with traveled internationally in th last month?         If yes, which countries?   Have you  or someone you have been in contact with traveled outside [place] in th last month?         If yes, which state and city?   COMMENTS OR ACTION PLAN FOR THIS PATIENT:         q 

## 2019-06-06 ENCOUNTER — Ambulatory Visit (HOSPITAL_COMMUNITY)
Admission: RE | Admit: 2019-06-06 | Discharge: 2019-06-06 | Disposition: A | Discharge status: Home / Self Care | Payer: 59 | Source: Ambulatory Visit | Attending: Surgery | Admitting: Surgery

## 2019-06-06 ENCOUNTER — Ambulatory Visit (INDEPENDENT_AMBULATORY_CARE_PROVIDER_SITE_OTHER)
Admission: RE | Admit: 2019-06-06 | Discharge: 2019-06-06 | Disposition: A | Discharge status: Home / Self Care | Payer: 59 | Source: Ambulatory Visit | Attending: Surgery | Admitting: Surgery

## 2019-06-06 ENCOUNTER — Other Ambulatory Visit: Payer: Self-pay

## 2019-06-06 ENCOUNTER — Encounter: Payer: Self-pay | Admitting: Surgery

## 2019-06-06 ENCOUNTER — Ambulatory Visit (INDEPENDENT_AMBULATORY_CARE_PROVIDER_SITE_OTHER): Payer: 59 | Admitting: Surgery

## 2019-06-06 VITALS — BP 133/78 | HR 100 | Temp 97.6°F | Resp 20 | Ht 68.0 in | Wt 167.0 lb

## 2019-06-06 DIAGNOSIS — I739 Peripheral vascular disease, unspecified: Secondary | ICD-10-CM | POA: Diagnosis present

## 2019-06-06 DIAGNOSIS — S85001S Unspecified injury of popliteal artery, right leg, sequela: Secondary | ICD-10-CM

## 2019-06-06 DIAGNOSIS — S85002A Unspecified injury of popliteal artery, left leg, initial encounter: Secondary | ICD-10-CM | POA: Insufficient documentation
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
## 2019-06-16 NOTE — Telephone Encounter (Signed)
Received OT plan of care from PT [facility]. Plan of care signed by Dr. Jaynee Eagles and faxed back. Received a receipt of confirmation.

## 2020-01-24 ENCOUNTER — Ambulatory Visit: Payer: 59 | Attending: Internal Medicine

## 2020-01-24 DIAGNOSIS — Z23 Encounter for immunization: Secondary | ICD-10-CM

## 2020-01-24 NOTE — Progress Notes (Signed)
   Covid-19 Vaccination Clinic  Name:  [name]    MRN: [id number] DOB: 1975/11/01  01/24/2020  [name] was observed post Covid-19 immunization for 15 minutes without incident. He was provided with Vaccine Information Sheet and instruction to access the V-Safe system.   [name] was instructed to call 911 with any severe reactions post vaccine: [name] Difficulty breathing  . Swelling of face and throat  . A fast heartbeat  . A bad rash all over body  . Dizziness and weakness   Immunizations Administered    Name Date Dose VIS Date Route   Pfizer COVID-19 Vaccine 01/24/2020  4:43 PM 0.3 mL 11/30/2018 Intramuscular   Manufacturer: [employer], [employer]   Lot: [id number]   NDC: [address]-3

## 2020-01-27 ENCOUNTER — Other Ambulatory Visit: Payer: Self-pay | Admitting: Sports Medicine

## 2020-01-27 ENCOUNTER — Ambulatory Visit: Payer: 59 | Admitting: Sports Medicine

## 2020-01-27 DIAGNOSIS — M21619 Bunion of unspecified foot: Secondary | ICD-10-CM

## 2020-07-06 ENCOUNTER — Other Ambulatory Visit: Payer: Self-pay | Admitting: *Deleted

## 2020-07-10 ENCOUNTER — Other Ambulatory Visit: Payer: Self-pay | Admitting: *Deleted

## 2020-07-10 DIAGNOSIS — S85001S Unspecified injury of popliteal artery, right leg, sequela: Secondary | ICD-10-CM

## 2020-07-10 DIAGNOSIS — I739 Peripheral vascular disease, unspecified: Secondary | ICD-10-CM

## 2020-07-31 ENCOUNTER — Encounter (HOSPITAL_COMMUNITY): Payer: 59

## 2020-07-31 ENCOUNTER — Inpatient Hospital Stay (HOSPITAL_COMMUNITY): Admission: RE | Admit: 2020-07-31 | Payer: 59 | Source: Ambulatory Visit

## 2020-07-31 ENCOUNTER — Ambulatory Visit: Payer: 59

## 2020-08-18 ENCOUNTER — Ambulatory Visit: Payer: 59

## 2020-09-13 ENCOUNTER — Other Ambulatory Visit: Payer: Self-pay

## 2020-09-13 ENCOUNTER — Ambulatory Visit (INDEPENDENT_AMBULATORY_CARE_PROVIDER_SITE_OTHER): Payer: 59 | Admitting: Physician Assistant

## 2020-09-13 ENCOUNTER — Ambulatory Visit (INDEPENDENT_AMBULATORY_CARE_PROVIDER_SITE_OTHER)
Admission: RE | Admit: 2020-09-13 | Discharge: 2020-09-13 | Disposition: A | Discharge status: Home / Self Care | Payer: 59 | Source: Ambulatory Visit | Attending: Surgery | Admitting: Surgery

## 2020-09-13 ENCOUNTER — Ambulatory Visit (HOSPITAL_COMMUNITY)
Admission: RE | Admit: 2020-09-13 | Discharge: 2020-09-13 | Disposition: A | Discharge status: Home / Self Care | Payer: 59 | Source: Ambulatory Visit | Attending: Surgery | Admitting: Surgery

## 2020-09-13 VITALS — BP 98/58 | HR 60 | Temp 98.6°F | Resp 20 | Ht 68.0 in | Wt 140.1 lb

## 2020-09-13 DIAGNOSIS — S85001S Unspecified injury of popliteal artery, right leg, sequela: Secondary | ICD-10-CM

## 2020-09-13 DIAGNOSIS — I739 Peripheral vascular disease, unspecified: Secondary | ICD-10-CM | POA: Diagnosis not present
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
## 2022-02-14 ENCOUNTER — Other Ambulatory Visit: Payer: Self-pay | Admitting: Nurse Practitioner

## 2022-02-14 ENCOUNTER — Other Ambulatory Visit (HOSPITAL_COMMUNITY): Payer: Self-pay | Admitting: Nurse Practitioner

## 2022-02-14 DIAGNOSIS — R7989 Other specified abnormal findings of blood chemistry: Secondary | ICD-10-CM

## 2022-02-20 ENCOUNTER — Encounter (HOSPITAL_COMMUNITY): Payer: Self-pay

## 2022-02-20 ENCOUNTER — Other Ambulatory Visit: Payer: Self-pay

## 2022-02-20 ENCOUNTER — Emergency Department (HOSPITAL_COMMUNITY)
Admission: EM | Admit: 2022-02-20 | Discharge: 2022-02-20 | Disposition: A | Discharge status: Home / Self Care | Payer: 59 | Attending: Emergency Medicine | Admitting: Emergency Medicine

## 2022-02-20 DIAGNOSIS — Z79899 Other long term (current) drug therapy: Secondary | ICD-10-CM | POA: Diagnosis not present

## 2022-02-20 DIAGNOSIS — F1110 Opioid abuse, uncomplicated: Secondary | ICD-10-CM | POA: Diagnosis not present

## 2022-02-20 DIAGNOSIS — R197 Diarrhea, unspecified: Secondary | ICD-10-CM | POA: Diagnosis present

## 2022-02-20 MED ORDER — NARCAN 4 MG/0.1ML NA LIQD: 1.0000 | Freq: Once | NASAL | 0 refills | Status: AC

## 2022-02-20 MED ORDER — CLONIDINE HCL 0.1 MG PO TABS: 0.1000 mg | ORAL_TABLET | Freq: Two times a day (BID) | ORAL | 0 refills | Status: AC | PRN

## 2022-02-20 MED ORDER — ONDANSETRON 4 MG PO TBDP: 4.0000 mg | ORAL_TABLET | Freq: Three times a day (TID) | ORAL | 0 refills | Status: AC | PRN

## 2022-02-20 NOTE — ED Triage Notes (Addendum)
Pt reports wanting to detox off of Fentanyl. Pt states that he last used 4 pm yesterday. Pt states that he spoke with someone and he has to detox first. Pt states that his nose is running and he is sweating.

## 2022-02-20 NOTE — ED Notes (Signed)
I provided reinforced discharge education based off of discharge instructions. Pt acknowledged and understood my education. Pt had no further questions/concerns for provider/myself.  °

## 2022-02-20 NOTE — ED Provider Notes (Signed)
[facility] DEPT  Provider Note  CSN: [id number] Arrival date & time: 02/20/22 0001  History Chief Complaint  Patient presents with   Detox    [name] is a 46 y.o. male with history of IVDA was sober for several years until his fiancee was killed last year. He has been using fentanyl IV since that time and has recently decided to get help. He has been in contact with long term treatment facility in the [place] area who advised he needed to detox before they would take him. He is here seeking detox. Currently having some loose stools, runny nose and piloerection. Not vomiting.    Home Medications Prior to Admission medications   Medication Sig Start Date End Date Taking? Authorizing Provider  cloNIDine (CATAPRES) 0.1 MG tablet Take 1 tablet (0.1 mg total) by mouth 2 (two) times daily as needed (withdrawal symptoms). 02/20/22  Yes Truddie Hidden, MD  ondansetron (ZOFRAN-ODT) 4 MG disintegrating tablet Take 1 tablet (4 mg total) by mouth every 8 (eight) hours as needed for nausea or vomiting. 02/20/22  Yes Truddie Hidden, MD  albuterol (PROVENTIL HFA;VENTOLIN HFA) 108 (90 Base) MCG/ACT inhaler Inhale 2 puffs into the lungs every 6 (six) hours as needed for wheezing or shortness of breath.    [provider]  esomeprazole (NEXIUM) 40 MG capsule Take 1 capsule (40 mg total) by mouth daily. For acid reflux 12/12/16   Lindell Spar I, NP  gabapentin (NEURONTIN) 600 MG tablet Take 600 mg by mouth 3 (three) times daily.    [provider]  mirtazapine (REMERON) 15 MG tablet  05/20/19   [provider]  mupirocin ointment (BACTROBAN) 2 % Apply topically daily. Cleanse wounds to left lower leg and left AC space with NS.  Apply mupirocin ointment to wound bed.  Cover with dry dressing and kerlix.  Change daily. 03/06/18   Florencia Reasons, MD  [facility] 4 MG/0.1ML LIQD nasal spray kit Place 1 spray into the nose once for 1 dose. 02/20/22 02/20/22   Truddie Hidden, MD     Allergies    Penicillin g, Flexeril [cyclobenzaprine], and Penicillins   Review of Systems   Review of Systems Please see HPI for pertinent positives and negatives  Physical Exam BP 133/80   Pulse 86   Temp 97.6 F (36.4 C) (Oral)   Resp 16   Ht 5' 7"  (1.702 m)   Wt 63.5 kg   SpO2 99%   BMI 21.93 kg/m   Physical Exam Vitals and nursing note reviewed.  HENT:     Head: Normocephalic.     Nose: Nose normal.  Eyes:     Extraocular Movements: Extraocular movements intact.  Pulmonary:     Effort: Pulmonary effort is normal.  Musculoskeletal:        General: Normal range of motion.     Cervical back: Neck supple.     Comments: No skin abscess in [facility]  Skin:    Findings: No rash (on exposed skin).  Neurological:     Mental Status: He is alert and oriented to person, place, and time.  Psychiatric:        Mood and Affect: Mood normal.    ED Results / Procedures / Treatments   EKG None  Procedures Procedures  Medications Ordered in the ED Medications - No data to display  Initial Impression and Plan  Patient here with opiate addiction, seeking detox as part of his initial recovery. He  was advised that we do not offer inpatient detox in our facility. He will be given Rx for Zofran and Clonidine to help with withdrawal symptoms and provided with a Resource Guide to help him find a facility that can help him. Also given [place] as he expressed a desire to talk to mental health about his grief. No other acute medical issues apparent today. Vitals are normal and he is safe for discharge.   ED Course       MDM Rules/Calculators/A&P Medical Decision Making Problems Addressed: Opiate abuse, continuous ([place]): chronic illness or injury  Risk Decision regarding hospitalization.    Final Clinical Impression(s) / ED Diagnoses Final diagnoses:  Opiate abuse, continuous ([place])    Rx / DC Orders ED Discharge Orders          Ordered    NARCAN  4 MG/0.1ML LIQD nasal spray kit   Once        02/20/22 0410    ondansetron (ZOFRAN-ODT) 4 MG disintegrating tablet  Every 8 hours PRN        02/20/22 0410    cloNIDine (CATAPRES) 0.1 MG tablet  2 times daily PRN        02/20/22 0410             Truddie Hidden, MD 02/20/22 [phone]

## 2022-07-02 ENCOUNTER — Other Ambulatory Visit: Payer: Self-pay

## 2022-07-02 ENCOUNTER — Emergency Department (HOSPITAL_COMMUNITY)

## 2022-07-02 ENCOUNTER — Encounter (HOSPITAL_COMMUNITY): Payer: Self-pay | Admitting: Emergency Medicine

## 2022-07-02 ENCOUNTER — Emergency Department (HOSPITAL_COMMUNITY)
Admission: EM | Admit: 2022-07-02 | Discharge: 2022-07-02 | Disposition: A | Discharge status: Home / Self Care | Attending: Emergency Medicine | Admitting: Emergency Medicine

## 2022-07-02 DIAGNOSIS — F17210 Nicotine dependence, cigarettes, uncomplicated: Secondary | ICD-10-CM | POA: Insufficient documentation

## 2022-07-02 DIAGNOSIS — R112 Nausea with vomiting, unspecified: Secondary | ICD-10-CM | POA: Insufficient documentation

## 2022-07-02 LAB — TYPE AND SCREEN
ABO/RH(D): O POS
Antibody Screen: NEGATIVE

## 2022-07-02 LAB — CBC
HCT: 49.6 % (ref 39.0–52.0)
Hemoglobin: 16.8 g/dL (ref 13.0–17.0)
MCH: 29.1 pg (ref 26.0–34.0)
MCHC: 33.9 g/dL (ref 30.0–36.0)
MCV: 86 fL (ref 80.0–100.0)
Platelets: 456 10*3/uL — ABNORMAL HIGH (ref 150–400)
RBC: 5.77 MIL/uL (ref 4.22–5.81)
RDW: 14.6 % (ref 11.5–15.5)
WBC: 11.8 10*3/uL — ABNORMAL HIGH (ref 4.0–10.5)
nRBC: 0 % (ref 0.0–0.2)

## 2022-07-02 LAB — COMPREHENSIVE METABOLIC PANEL
ALT: 26 U/L (ref 0–44)
AST: 24 U/L (ref 15–41)
Albumin: 4.2 g/dL (ref 3.5–5.0)
Alkaline Phosphatase: 89 U/L (ref 38–126)
Anion gap: 12 (ref 5–15)
BUN: 32 mg/dL — ABNORMAL HIGH (ref 6–20)
CO2: 25 mmol/L (ref 22–32)
Calcium: 9.2 mg/dL (ref 8.9–10.3)
Chloride: 99 mmol/L (ref 98–111)
Creatinine, Ser: 0.89 mg/dL (ref 0.61–1.24)
GFR, Estimated: >60 mL/min (ref >60)
Glucose, Bld: 121 mg/dL — ABNORMAL HIGH (ref 70–99)
Potassium: 3.6 mmol/L (ref 3.5–5.1)
Sodium: 136 mmol/L (ref 135–145)
Total Bilirubin: 1.3 mg/dL — ABNORMAL HIGH (ref 0.3–1.2)
Total Protein: 8.4 g/dL — ABNORMAL HIGH (ref 6.5–8.1)

## 2022-07-02 LAB — POC OCCULT BLOOD, ED: Fecal Occult Bld: NEGATIVE

## 2022-07-02 MED ORDER — LIDOCAINE VISCOUS HCL 2 % MT SOLN
15.0000 mL | Freq: Once | OROMUCOSAL | Status: AC
  Administered 2022-07-02: 15 mL via ORAL
  Filled 2022-07-02: qty 15

## 2022-07-02 MED ORDER — ALUM & MAG HYDROXIDE-SIMETH 200-200-20 MG/5ML PO SUSP
30.0000 mL | Freq: Once | ORAL | Status: AC
  Administered 2022-07-02: 30 mL via ORAL
  Filled 2022-07-02: qty 30
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
## 2022-07-16 ENCOUNTER — Encounter (HOSPITAL_COMMUNITY): Payer: Self-pay

## 2022-07-16 ENCOUNTER — Emergency Department (HOSPITAL_COMMUNITY)

## 2022-07-16 ENCOUNTER — Emergency Department (HOSPITAL_COMMUNITY)
Admission: EM | Admit: 2022-07-16 | Discharge: 2022-07-17 | Attending: Emergency Medicine | Admitting: Emergency Medicine

## 2022-07-16 ENCOUNTER — Other Ambulatory Visit: Payer: Self-pay

## 2022-07-16 DIAGNOSIS — R112 Nausea with vomiting, unspecified: Secondary | ICD-10-CM

## 2022-07-16 DIAGNOSIS — E860 Dehydration: Secondary | ICD-10-CM | POA: Insufficient documentation

## 2022-07-16 LAB — URINALYSIS, ROUTINE W REFLEX MICROSCOPIC
Bilirubin Urine: NEGATIVE
Glucose, UA: NEGATIVE mg/dL
Hgb urine dipstick: NEGATIVE
Ketones, ur: NEGATIVE mg/dL
Leukocytes,Ua: NEGATIVE
Nitrite: NEGATIVE
Protein, ur: NEGATIVE mg/dL
Specific Gravity, Urine: 1.008 (ref 1.005–1.030)
pH: 7 (ref 5.0–8.0)

## 2022-07-16 LAB — COMPREHENSIVE METABOLIC PANEL
ALT: 30 U/L (ref 0–44)
AST: 18 U/L (ref 15–41)
Albumin: 3.6 g/dL (ref 3.5–5.0)
Alkaline Phosphatase: 78 U/L (ref 38–126)
Anion gap: 8 (ref 5–15)
BUN: 19 mg/dL (ref 6–20)
CO2: 24 mmol/L (ref 22–32)
Calcium: 8.6 mg/dL — ABNORMAL LOW (ref 8.9–10.3)
Chloride: 101 mmol/L (ref 98–111)
Creatinine, Ser: 0.74 mg/dL (ref 0.61–1.24)
GFR, Estimated: >60 mL/min (ref >60)
Glucose, Bld: 86 mg/dL (ref 70–99)
Potassium: 3.8 mmol/L (ref 3.5–5.1)
Sodium: 133 mmol/L — ABNORMAL LOW (ref 135–145)
Total Bilirubin: 0.4 mg/dL (ref 0.3–1.2)
Total Protein: 7.1 g/dL (ref 6.5–8.1)

## 2022-07-16 LAB — CBC WITH DIFFERENTIAL/PLATELET
Abs Immature Granulocytes: 0.06 10*3/uL (ref 0.00–0.07)
Basophils Absolute: 0.1 10*3/uL (ref 0.0–0.1)
Basophils Relative: 1 %
Eosinophils Absolute: 0.1 10*3/uL (ref 0.0–0.5)
Eosinophils Relative: 1 %
HCT: 41.3 % (ref 39.0–52.0)
Hemoglobin: 13.9 g/dL (ref 13.0–17.0)
Immature Granulocytes: 1 %
Lymphocytes Relative: 33 %
Lymphs Abs: 3 10*3/uL (ref 0.7–4.0)
MCH: 29.8 pg (ref 26.0–34.0)
MCHC: 33.7 g/dL (ref 30.0–36.0)
MCV: 88.4 fL (ref 80.0–100.0)
Monocytes Absolute: 0.7 10*3/uL (ref 0.1–1.0)
Monocytes Relative: 8 %
Neutro Abs: 5.1 10*3/uL (ref 1.7–7.7)
Neutrophils Relative %: 56 %
Platelets: 376 10*3/uL (ref 150–400)
RBC: 4.67 MIL/uL (ref 4.22–5.81)
RDW: 14.6 % (ref 11.5–15.5)
WBC: 9.1 10*3/uL (ref 4.0–10.5)
nRBC: 0 % (ref 0.0–0.2)

## 2022-07-16 MED ORDER — IOHEXOL 300 MG/ML  SOLN
100.0000 mL | Freq: Once | INTRAMUSCULAR | Status: AC | PRN
  Administered 2022-07-16: 100 mL via INTRAVENOUS

## 2022-07-16 MED ORDER — SODIUM CHLORIDE 0.9 % IV BOLUS
1000.0000 mL | Freq: Once | INTRAVENOUS | Status: AC
  Administered 2022-07-16: 1000 mL via INTRAVENOUS

## 2022-07-16 MED ORDER — FAMOTIDINE IN NACL 20-0.9 MG/50ML-% IV SOLN
20.0000 mg | Freq: Once | INTRAVENOUS | Status: AC
  Administered 2022-07-16: 20 mg via INTRAVENOUS
  Filled 2022-07-16: qty 50

## 2022-07-16 MED ORDER — ESOMEPRAZOLE MAGNESIUM 40 MG PO CPDR: 40.0000 mg | DELAYED_RELEASE_CAPSULE | Freq: Every day | ORAL | 0 refills | Status: AC

## 2022-07-16 NOTE — ED Provider Notes (Signed)
[facility]-EMERGENCY DEPT Provider Note   CSN: [id number] Arrival date & time: 07/16/22  1946     History  Chief Complaint  Patient presents with   Dehydration    [name] is a 46 y.o. male.  He is here from jail for evaluation of feeling dehydrated and right-sided abdominal pain.  He said he has not been able to eat due to not having teeth.  When he can eat it is only spicy food and it is caused him to have reflux and vomiting.  Now he is having right-sided abdominal pain and inability to hold down food.  They have tried Maalox and Zofran at the facility without improvement.  The history is provided by the patient.  Emesis Severity:  Moderate Duration:  3 days Timing:  Sporadic Quality:  Stomach contents and bilious material Progression:  Unchanged Chronicity:  New Relieved by:  Nothing Worsened by:  Liquids Ineffective treatments:  Antiemetics Associated symptoms: abdominal pain   Associated symptoms: no cough, no diarrhea and no fever        Home Medications Prior to Admission medications   Medication Sig Start Date End Date Taking? Authorizing Provider  albuterol (PROVENTIL HFA;VENTOLIN HFA) 108 (90 Base) MCG/ACT inhaler Inhale 2 puffs into the lungs every 6 (six) hours as needed for wheezing or shortness of breath.    [provider]  cloNIDine (CATAPRES) 0.1 MG tablet Take 1 tablet (0.1 mg total) by mouth 2 (two) times daily as needed (withdrawal symptoms). 02/20/22   Pollyann Savoy, MD  esomeprazole (NEXIUM) 40 MG capsule Take 1 capsule (40 mg total) by mouth daily. For acid reflux 12/12/16   Armandina Stammer I, NP  gabapentin (NEURONTIN) 600 MG tablet Take 600 mg by mouth 3 (three) times daily.    [provider]  mirtazapine (REMERON) 15 MG tablet  05/20/19   [provider]  mupirocin ointment (BACTROBAN) 2 % Apply topically daily. Cleanse wounds to left lower leg and left AC space with NS.  Apply mupirocin ointment  to wound bed.  Cover with dry dressing and kerlix.  Change daily. 03/06/18   Albertine Grates, MD  ondansetron (ZOFRAN-ODT) 4 MG disintegrating tablet Take 1 tablet (4 mg total) by mouth every 8 (eight) hours as needed for nausea or vomiting. 02/20/22   Pollyann Savoy, MD      Allergies    Penicillin g, Flexeril [cyclobenzaprine], and Penicillins    Review of Systems   Review of Systems  Constitutional:  Negative for fever.  Respiratory:  Negative for cough.   Cardiovascular:  Negative for chest pain.  Gastrointestinal:  Positive for abdominal pain, nausea and vomiting. Negative for diarrhea.    Physical Exam Updated Vital Signs BP 124/79   Pulse 66   Temp 98 F (36.7 C) (Oral)   Resp 20   SpO2 100%  Physical Exam Vitals and nursing note reviewed.  Constitutional:      General: He is not in acute distress.    Appearance: Normal appearance. He is well-developed.  HENT:     Head: Normocephalic and atraumatic.  Eyes:     Conjunctiva/sclera: Conjunctivae normal.  Cardiovascular:     Rate and Rhythm: Normal rate and regular rhythm.     Heart sounds: No murmur heard. Pulmonary:     Effort: Pulmonary effort is normal. No respiratory distress.     Breath sounds: Normal breath sounds.  Abdominal:     Palpations: Abdomen is soft.  Tenderness: There is no abdominal tenderness. There is no guarding or rebound.  Musculoskeletal:        General: No deformity. Normal range of motion.     Cervical back: Neck supple.     Right lower leg: No edema.     Left lower leg: No edema.  Skin:    General: Skin is warm and dry.     Capillary Refill: Capillary refill takes less than 2 seconds.  Neurological:     General: No focal deficit present.     Mental Status: He is alert.     Sensory: No sensory deficit.     Motor: No weakness.     ED Results / Procedures / Treatments   Labs (all labs ordered are listed, but only abnormal results are displayed) Labs Reviewed  COMPREHENSIVE METABOLIC  PANEL - Abnormal; Notable for the following components:      Result Value   Sodium 133 (*)    Calcium 8.6 (*)    All other components within normal limits  URINALYSIS, ROUTINE W REFLEX MICROSCOPIC - Abnormal; Notable for the following components:   Color, Urine STRAW (*)    All other components within normal limits  CBC WITH DIFFERENTIAL/PLATELET    EKG None  Radiology CT Abdomen Pelvis W Contrast  Result Date: 07/16/2022 CLINICAL DATA:  Acute nonlocalized abdominal pain. Dehydration. Patient has not eaten in days. EXAM: CT ABDOMEN AND PELVIS WITH CONTRAST TECHNIQUE: Multidetector CT imaging of the abdomen and pelvis was performed using the standard protocol following bolus administration of intravenous contrast. RADIATION DOSE REDUCTION: This exam was performed according to the departmental dose-optimization program which includes automated exposure control, adjustment of the mA and/or kV according to patient size and/or use of iterative reconstruction technique. CONTRAST:  188mL OMNIPAQUE IOHEXOL 300 MG/ML  SOLN COMPARISON:  02/22/2017 FINDINGS: Lower chest: Lung bases are clear. Hepatobiliary: No focal liver abnormality is seen. No gallstones, gallbladder wall thickening, or biliary dilatation. Pancreas: Unremarkable. No pancreatic ductal dilatation or surrounding inflammatory changes. Spleen: Normal in size without focal abnormality. Adrenals/Urinary Tract: Adrenal glands are unremarkable. Kidneys are normal, without renal calculi, focal lesion, or hydronephrosis. Bladder is unremarkable. Stomach/Bowel: Stomach, small bowel, and colon are not abnormally distended. Stool throughout the colon. No wall thickening or inflammatory changes. Appendix is surgically absent. Vascular/Lymphatic: No significant vascular findings are present. No enlarged abdominal or pelvic lymph nodes. Reproductive: Prostate is unremarkable. Other: No abdominal wall hernia or abnormality. No abdominopelvic ascites.  Musculoskeletal: Degenerative changes in the hips. No acute bony abnormalities. IMPRESSION: No acute process demonstrated in the abdomen or pelvis. No evidence of bowel obstruction or inflammation. Electronically Signed   By: Lucienne Capers M.D.   On: 07/16/2022 22:22    Procedures Procedures    Medications Ordered in ED Medications  sodium chloride 0.9 % bolus 1,000 mL (has no administration in time range)  famotidine (PEPCID) IVPB 20 mg premix (has no administration in time range)    ED Course/ Medical Decision Making/ A&P Clinical Course as of 07/17/22 1036  Wed Jul 16, 2022  2333 Patient is tolerating p.o.  He asked if I could write for a bland diet back at the facility. [MB]    Clinical Course User Index [MB] Hayden Rasmussen, MD                           Medical Decision Making Amount and/or Complexity of Data Reviewed Labs: ordered. Radiology: ordered.  Risk Prescription drug management.   This patient complains of abdominal pain nausea vomiting; this involves an extensive number of treatment Options and is a complaint that carries with it a high risk of complications and morbidity. The differential includes gastritis, peptic ulcer disease, perforation, obstruction, metabolic derangement  I ordered, reviewed and interpreted labs, which included CBC normal chemistries fairly unremarkable urinalysis without signs of infection I ordered medication IV fluids and Pepcid and reviewed PMP when indicated. I ordered imaging studies which included CT abdomen and pelvis  and I independently    visualized and interpreted imaging which showed no acute findings Previous records obtained and reviewed in epic no recent admissions Cardiac monitoring reviewed, normal sinus rhythm Social determinants considered, tobacco use lack of access Critical Interventions: None  After the interventions stated above, I reevaluated the patient and found patient to be symptomatically improved  tolerating p.o. Admission and further testing considered, no indications for admission or further work-up at this time.  We will adjust patient's diet and put on PPI.  Return instructions discussed         Final Clinical Impression(s) / ED Diagnoses Final diagnoses:  Dehydration  Nausea and vomiting, unspecified vomiting type    Rx / DC Orders ED Discharge Orders          Ordered    esomeprazole (NEXIUM) 40 MG capsule  Daily        07/16/22 2317              Terrilee Files, MD 07/17/22 1038

## 2022-07-16 NOTE — ED Triage Notes (Signed)
[facility] reports from jail with complaints of dehydration. Pt states that he hasn't eaten in days.

## 2022-07-16 NOTE — Discharge Instructions (Addendum)
You were seen in the emergency department for acid reflux nausea vomiting abdominal pain.  You had lab work and a CAT scan that did not show any significant abnormalities.  You were given IV fluids and nausea medication.  We are sending you back with a prescription for some acid medication.  Please return to the emergency department if any worsening or concerning symptoms.  Please allow for a bland diet at facility.

## 2022-07-17 NOTE — ED Notes (Signed)
Pt left in custody of sheriff dept. Pt verbalized understanding of discharge instructions. Pt wheeled from ED. Pt to remain in custody.

## 2022-12-05 DEATH — deceased
